# Patient Record
Sex: Female | Born: 1935 | Race: Black or African American | Hispanic: No | Smoking: Former smoker
Health system: Southern US, Community
[De-identification: ages and names within clinical notes are randomized; demographics above are authoritative.]

## PROBLEM LIST (undated history)

## (undated) DIAGNOSIS — K260 Acute duodenal ulcer with hemorrhage: Secondary | ICD-10-CM

## (undated) DIAGNOSIS — D126 Benign neoplasm of colon, unspecified: Secondary | ICD-10-CM

## (undated) DIAGNOSIS — H409 Unspecified glaucoma: Secondary | ICD-10-CM

## (undated) DIAGNOSIS — M1990 Unspecified osteoarthritis, unspecified site: Secondary | ICD-10-CM

## (undated) DIAGNOSIS — M899 Disorder of bone, unspecified: Secondary | ICD-10-CM

## (undated) DIAGNOSIS — Z23 Encounter for immunization: Secondary | ICD-10-CM

## (undated) DIAGNOSIS — K219 Gastro-esophageal reflux disease without esophagitis: Secondary | ICD-10-CM

## (undated) DIAGNOSIS — R5381 Other malaise: Secondary | ICD-10-CM

## (undated) DIAGNOSIS — M109 Gout, unspecified: Secondary | ICD-10-CM

## (undated) DIAGNOSIS — F3289 Other specified depressive episodes: Secondary | ICD-10-CM

## (undated) DIAGNOSIS — D649 Anemia, unspecified: Secondary | ICD-10-CM

## (undated) DIAGNOSIS — F329 Major depressive disorder, single episode, unspecified: Secondary | ICD-10-CM

## (undated) DIAGNOSIS — M949 Disorder of cartilage, unspecified: Secondary | ICD-10-CM

## (undated) DIAGNOSIS — R51 Headache: Secondary | ICD-10-CM

## (undated) DIAGNOSIS — R5383 Other fatigue: Secondary | ICD-10-CM

## (undated) DIAGNOSIS — I2510 Atherosclerotic heart disease of native coronary artery without angina pectoris: Secondary | ICD-10-CM

## (undated) DIAGNOSIS — I10 Essential (primary) hypertension: Secondary | ICD-10-CM

## (undated) DIAGNOSIS — E785 Hyperlipidemia, unspecified: Secondary | ICD-10-CM

## (undated) DIAGNOSIS — K5730 Diverticulosis of large intestine without perforation or abscess without bleeding: Secondary | ICD-10-CM

## (undated) DIAGNOSIS — J309 Allergic rhinitis, unspecified: Secondary | ICD-10-CM

## (undated) DIAGNOSIS — I219 Acute myocardial infarction, unspecified: Secondary | ICD-10-CM

## (undated) DIAGNOSIS — I509 Heart failure, unspecified: Secondary | ICD-10-CM

## (undated) DIAGNOSIS — J189 Pneumonia, unspecified organism: Secondary | ICD-10-CM

## (undated) HISTORY — PX: OTHER SURGICAL HISTORY: SHX169

## (undated) HISTORY — DX: Gastro-esophageal reflux disease without esophagitis: K21.9

## (undated) HISTORY — DX: Unspecified glaucoma: H40.9

## (undated) HISTORY — DX: Encounter for immunization: Z23

## (undated) HISTORY — DX: Heart failure, unspecified: I50.9

## (undated) HISTORY — DX: Diverticulosis of large intestine without perforation or abscess without bleeding: K57.30

## (undated) HISTORY — PX: ECTOPIC PREGNANCY SURGERY: SHX613

## (undated) HISTORY — DX: Other malaise: R53.81

## (undated) HISTORY — PX: VESICOVAGINAL FISTULA CLOSURE W/ TAH: SUR271

## (undated) HISTORY — DX: Disorder of cartilage, unspecified: M94.9

## (undated) HISTORY — DX: Disorder of bone, unspecified: M89.9

## (undated) HISTORY — DX: Major depressive disorder, single episode, unspecified: F32.9

## (undated) HISTORY — DX: Anemia, unspecified: D64.9

## (undated) HISTORY — DX: Allergic rhinitis, unspecified: J30.9

## (undated) HISTORY — DX: Essential (primary) hypertension: I10

## (undated) HISTORY — DX: Other specified depressive episodes: F32.89

## (undated) HISTORY — DX: Other malaise: R53.83

## (undated) HISTORY — PX: CORONARY ANGIOPLASTY WITH STENT PLACEMENT: SHX49

## (undated) HISTORY — DX: Acute myocardial infarction, unspecified: I21.9

## (undated) HISTORY — DX: Morbid (severe) obesity due to excess calories: E66.01

## (undated) HISTORY — DX: Unspecified osteoarthritis, unspecified site: M19.90

## (undated) HISTORY — DX: Atherosclerotic heart disease of native coronary artery without angina pectoris: I25.10

## (undated) HISTORY — DX: Hyperlipidemia, unspecified: E78.5

## (undated) HISTORY — DX: Gout, unspecified: M10.9

## (undated) HISTORY — DX: Benign neoplasm of colon, unspecified: D12.6

---

## 1997-03-19 ENCOUNTER — Ambulatory Visit (HOSPITAL_COMMUNITY): Admission: RE | Admit: 1997-03-19 | Discharge: 1997-03-19 | Payer: Self-pay | Admitting: *Deleted

## 1998-02-25 ENCOUNTER — Ambulatory Visit (HOSPITAL_COMMUNITY): Admission: RE | Admit: 1998-02-25 | Discharge: 1998-02-25 | Payer: Self-pay | Admitting: *Deleted

## 1998-02-26 ENCOUNTER — Encounter: Payer: Self-pay | Admitting: Orthopedic Surgery

## 1998-02-26 ENCOUNTER — Ambulatory Visit (HOSPITAL_COMMUNITY): Admission: RE | Admit: 1998-02-26 | Discharge: 1998-02-26 | Payer: Self-pay | Admitting: Orthopedic Surgery

## 1998-04-15 ENCOUNTER — Encounter: Payer: Self-pay | Admitting: *Deleted

## 1998-04-15 ENCOUNTER — Ambulatory Visit (HOSPITAL_COMMUNITY): Admission: RE | Admit: 1998-04-15 | Discharge: 1998-04-15 | Payer: Self-pay | Admitting: *Deleted

## 1998-04-16 ENCOUNTER — Encounter: Payer: Self-pay | Admitting: Orthopedic Surgery

## 1998-04-18 ENCOUNTER — Inpatient Hospital Stay (HOSPITAL_COMMUNITY): Admission: EM | Admit: 1998-04-18 | Discharge: 1998-04-22 | Payer: Self-pay | Admitting: Emergency Medicine

## 1998-04-18 ENCOUNTER — Encounter: Payer: Self-pay | Admitting: Emergency Medicine

## 1998-04-19 ENCOUNTER — Encounter: Payer: Self-pay | Admitting: Emergency Medicine

## 1998-06-01 ENCOUNTER — Encounter (HOSPITAL_COMMUNITY): Admission: RE | Admit: 1998-06-01 | Discharge: 1998-08-30 | Payer: Self-pay | Admitting: *Deleted

## 1998-09-14 ENCOUNTER — Encounter (HOSPITAL_COMMUNITY): Admission: RE | Admit: 1998-09-14 | Discharge: 1998-12-13 | Payer: Self-pay | Admitting: *Deleted

## 1998-11-16 ENCOUNTER — Ambulatory Visit (HOSPITAL_COMMUNITY): Admission: RE | Admit: 1998-11-16 | Discharge: 1998-11-17 | Payer: Self-pay | Admitting: *Deleted

## 1998-12-24 ENCOUNTER — Encounter: Payer: Self-pay | Admitting: Orthopedic Surgery

## 1998-12-27 ENCOUNTER — Encounter: Payer: Self-pay | Admitting: Orthopedic Surgery

## 1998-12-27 ENCOUNTER — Inpatient Hospital Stay (HOSPITAL_COMMUNITY): Admission: RE | Admit: 1998-12-27 | Discharge: 1998-12-30 | Payer: Self-pay | Admitting: Orthopedic Surgery

## 1998-12-30 ENCOUNTER — Inpatient Hospital Stay (HOSPITAL_COMMUNITY)
Admission: RE | Admit: 1998-12-30 | Discharge: 1999-01-08 | Payer: Self-pay | Admitting: Physical Medicine & Rehabilitation

## 1999-02-15 ENCOUNTER — Encounter: Admission: RE | Admit: 1999-02-15 | Discharge: 1999-04-13 | Payer: Self-pay | Admitting: Orthopedic Surgery

## 1999-04-19 ENCOUNTER — Ambulatory Visit (HOSPITAL_COMMUNITY): Admission: RE | Admit: 1999-04-19 | Discharge: 1999-04-19 | Payer: Self-pay | Admitting: *Deleted

## 1999-04-19 ENCOUNTER — Encounter: Payer: Self-pay | Admitting: *Deleted

## 1999-06-07 ENCOUNTER — Ambulatory Visit (HOSPITAL_COMMUNITY): Admission: RE | Admit: 1999-06-07 | Discharge: 1999-06-08 | Payer: Self-pay | Admitting: *Deleted

## 2000-03-07 ENCOUNTER — Encounter (INDEPENDENT_AMBULATORY_CARE_PROVIDER_SITE_OTHER): Payer: Self-pay

## 2000-03-07 ENCOUNTER — Other Ambulatory Visit: Admission: RE | Admit: 2000-03-07 | Discharge: 2000-03-07 | Payer: Self-pay | Admitting: Internal Medicine

## 2000-04-20 ENCOUNTER — Encounter: Payer: Self-pay | Admitting: Internal Medicine

## 2000-04-20 ENCOUNTER — Ambulatory Visit (HOSPITAL_COMMUNITY): Admission: RE | Admit: 2000-04-20 | Discharge: 2000-04-20 | Payer: Self-pay | Admitting: Internal Medicine

## 2000-08-08 ENCOUNTER — Other Ambulatory Visit: Admission: RE | Admit: 2000-08-08 | Discharge: 2000-08-08 | Payer: Self-pay | Admitting: Internal Medicine

## 2000-08-08 ENCOUNTER — Encounter (INDEPENDENT_AMBULATORY_CARE_PROVIDER_SITE_OTHER): Payer: Self-pay | Admitting: Specialist

## 2001-04-22 ENCOUNTER — Ambulatory Visit (HOSPITAL_COMMUNITY): Admission: RE | Admit: 2001-04-22 | Discharge: 2001-04-22 | Payer: Self-pay | Admitting: Internal Medicine

## 2001-04-22 ENCOUNTER — Encounter: Payer: Self-pay | Admitting: Internal Medicine

## 2002-04-30 ENCOUNTER — Encounter: Payer: Self-pay | Admitting: Internal Medicine

## 2002-04-30 ENCOUNTER — Ambulatory Visit (HOSPITAL_COMMUNITY): Admission: RE | Admit: 2002-04-30 | Discharge: 2002-04-30 | Payer: Self-pay | Admitting: Internal Medicine

## 2002-09-10 ENCOUNTER — Encounter: Admission: RE | Admit: 2002-09-10 | Discharge: 2002-09-10 | Payer: Self-pay | Admitting: Internal Medicine

## 2002-09-10 ENCOUNTER — Encounter: Payer: Self-pay | Admitting: Internal Medicine

## 2003-09-10 ENCOUNTER — Encounter: Payer: Self-pay | Admitting: Internal Medicine

## 2003-12-25 ENCOUNTER — Ambulatory Visit: Payer: Self-pay | Admitting: Cardiology

## 2005-01-30 ENCOUNTER — Ambulatory Visit: Payer: Self-pay | Admitting: Cardiology

## 2005-02-19 ENCOUNTER — Emergency Department (HOSPITAL_COMMUNITY): Admission: EM | Admit: 2005-02-19 | Discharge: 2005-02-19 | Payer: Self-pay | Admitting: Emergency Medicine

## 2005-02-23 ENCOUNTER — Ambulatory Visit: Payer: Self-pay | Admitting: Internal Medicine

## 2005-03-01 ENCOUNTER — Ambulatory Visit: Payer: Self-pay | Admitting: Family Medicine

## 2005-03-22 ENCOUNTER — Ambulatory Visit: Payer: Self-pay | Admitting: Cardiology

## 2005-05-05 ENCOUNTER — Ambulatory Visit: Payer: Self-pay | Admitting: Internal Medicine

## 2005-06-29 ENCOUNTER — Ambulatory Visit: Payer: Self-pay | Admitting: Internal Medicine

## 2005-08-11 ENCOUNTER — Ambulatory Visit: Payer: Self-pay | Admitting: Internal Medicine

## 2006-05-30 ENCOUNTER — Ambulatory Visit: Payer: Self-pay | Admitting: Cardiology

## 2006-05-30 LAB — CONVERTED CEMR LAB
ALT: 10 units/L (ref 0–40)
AST: 14 units/L (ref 0–37)
Albumin: 3.2 g/dL — ABNORMAL LOW (ref 3.5–5.2)
Alkaline Phosphatase: 66 units/L (ref 39–117)
Bilirubin, Direct: 0.1 mg/dL (ref 0.0–0.3)
Cholesterol: 123 mg/dL (ref 0–200)
HDL: 37.6 mg/dL — ABNORMAL LOW (ref >39.0)
LDL Cholesterol: 58 mg/dL (ref 0–99)
Total Bilirubin: 0.5 mg/dL (ref 0.3–1.2)
Total CHOL/HDL Ratio: 3.3
Total Protein: 6.6 g/dL (ref 6.0–8.3)
Triglycerides: 136 mg/dL (ref 0–149)
VLDL: 27 mg/dL (ref 0–40)

## 2006-08-22 ENCOUNTER — Ambulatory Visit: Payer: Self-pay | Admitting: Internal Medicine

## 2006-10-04 ENCOUNTER — Encounter: Payer: Self-pay | Admitting: *Deleted

## 2006-10-04 DIAGNOSIS — I10 Essential (primary) hypertension: Secondary | ICD-10-CM

## 2006-10-04 DIAGNOSIS — E785 Hyperlipidemia, unspecified: Secondary | ICD-10-CM

## 2006-10-04 DIAGNOSIS — M109 Gout, unspecified: Secondary | ICD-10-CM

## 2006-10-04 DIAGNOSIS — I2510 Atherosclerotic heart disease of native coronary artery without angina pectoris: Secondary | ICD-10-CM | POA: Insufficient documentation

## 2006-10-09 ENCOUNTER — Ambulatory Visit: Payer: Self-pay | Admitting: Cardiology

## 2007-03-28 ENCOUNTER — Encounter: Payer: Self-pay | Admitting: Internal Medicine

## 2007-03-28 ENCOUNTER — Ambulatory Visit: Payer: Self-pay | Admitting: Internal Medicine

## 2007-06-15 DIAGNOSIS — K5730 Diverticulosis of large intestine without perforation or abscess without bleeding: Secondary | ICD-10-CM | POA: Insufficient documentation

## 2007-06-15 DIAGNOSIS — I252 Old myocardial infarction: Secondary | ICD-10-CM

## 2007-06-15 DIAGNOSIS — D126 Benign neoplasm of colon, unspecified: Secondary | ICD-10-CM

## 2007-07-03 ENCOUNTER — Ambulatory Visit: Payer: Self-pay | Admitting: Internal Medicine

## 2007-07-03 DIAGNOSIS — R5381 Other malaise: Secondary | ICD-10-CM | POA: Insufficient documentation

## 2007-07-03 DIAGNOSIS — M949 Disorder of cartilage, unspecified: Secondary | ICD-10-CM

## 2007-07-03 DIAGNOSIS — I5022 Chronic systolic (congestive) heart failure: Secondary | ICD-10-CM

## 2007-07-03 DIAGNOSIS — J309 Allergic rhinitis, unspecified: Secondary | ICD-10-CM | POA: Insufficient documentation

## 2007-07-03 DIAGNOSIS — F329 Major depressive disorder, single episode, unspecified: Secondary | ICD-10-CM

## 2007-07-03 DIAGNOSIS — R5383 Other fatigue: Secondary | ICD-10-CM

## 2007-07-03 DIAGNOSIS — K219 Gastro-esophageal reflux disease without esophagitis: Secondary | ICD-10-CM | POA: Insufficient documentation

## 2007-07-03 DIAGNOSIS — M899 Disorder of bone, unspecified: Secondary | ICD-10-CM | POA: Insufficient documentation

## 2007-07-03 LAB — CONVERTED CEMR LAB
ALT: 12 units/L (ref 0–35)
AST: 15 units/L (ref 0–37)
Albumin: 3.7 g/dL (ref 3.5–5.2)
Alkaline Phosphatase: 72 units/L (ref 39–117)
BUN: 24 mg/dL — ABNORMAL HIGH (ref 6–23)
Basophils Absolute: 0 10*3/uL (ref 0.0–0.1)
Basophils Relative: 0 % (ref 0.0–1.0)
Bilirubin, Direct: 0.1 mg/dL (ref 0.0–0.3)
CO2: 25 meq/L (ref 19–32)
Calcium: 8.8 mg/dL (ref 8.4–10.5)
Chloride: 115 meq/L — ABNORMAL HIGH (ref 96–112)
Cholesterol: 149 mg/dL (ref 0–200)
Creatinine, Ser: 1.2 mg/dL (ref 0.4–1.2)
Eosinophils Absolute: 0.2 10*3/uL (ref 0.0–0.7)
Eosinophils Relative: 1.7 % (ref 0.0–5.0)
GFR calc Af Amer: 55 mL/min
GFR calc non Af Amer: 45 mL/min
Glucose, Bld: 112 mg/dL — ABNORMAL HIGH (ref 70–99)
HCT: 35.9 % — ABNORMAL LOW (ref 36.0–46.0)
HDL: 37.3 mg/dL — ABNORMAL LOW (ref >39.0)
Hemoglobin: 11.4 g/dL — ABNORMAL LOW (ref 12.0–15.0)
LDL Cholesterol: 83 mg/dL (ref 0–99)
Lymphocytes Relative: 18.2 % (ref 12.0–46.0)
MCHC: 31.9 g/dL (ref 30.0–36.0)
MCV: 94.3 fL (ref 78.0–100.0)
Monocytes Absolute: 0.8 10*3/uL (ref 0.1–1.0)
Monocytes Relative: 6.9 % (ref 3.0–12.0)
Neutro Abs: 8.2 10*3/uL — ABNORMAL HIGH (ref 1.4–7.7)
Neutrophils Relative %: 73.2 % (ref 43.0–77.0)
Platelets: 308 10*3/uL (ref 150–400)
Potassium: 4.8 meq/L (ref 3.5–5.1)
RBC: 3.81 M/uL — ABNORMAL LOW (ref 3.87–5.11)
RDW: 15.1 % — ABNORMAL HIGH (ref 11.5–14.6)
Sodium: 143 meq/L (ref 135–145)
TSH: 1.26 microintl units/mL (ref 0.35–5.50)
Total Bilirubin: 0.4 mg/dL (ref 0.3–1.2)
Total CHOL/HDL Ratio: 4
Total Protein: 6.9 g/dL (ref 6.0–8.3)
Triglycerides: 143 mg/dL (ref 0–149)
VLDL: 29 mg/dL (ref 0–40)
WBC: 11.2 10*3/uL — ABNORMAL HIGH (ref 4.5–10.5)

## 2007-07-18 ENCOUNTER — Ambulatory Visit: Payer: Self-pay | Admitting: Cardiology

## 2008-01-02 ENCOUNTER — Ambulatory Visit: Payer: Self-pay | Admitting: Internal Medicine

## 2008-01-22 ENCOUNTER — Ambulatory Visit: Payer: Self-pay | Admitting: Cardiology

## 2008-01-23 ENCOUNTER — Ambulatory Visit: Payer: Self-pay | Admitting: Internal Medicine

## 2008-08-17 DIAGNOSIS — M1990 Unspecified osteoarthritis, unspecified site: Secondary | ICD-10-CM

## 2008-08-17 DIAGNOSIS — H409 Unspecified glaucoma: Secondary | ICD-10-CM | POA: Insufficient documentation

## 2008-08-25 ENCOUNTER — Ambulatory Visit: Payer: Self-pay | Admitting: Cardiology

## 2008-10-26 ENCOUNTER — Encounter: Payer: Self-pay | Admitting: Internal Medicine

## 2008-12-18 ENCOUNTER — Encounter (INDEPENDENT_AMBULATORY_CARE_PROVIDER_SITE_OTHER): Payer: Self-pay | Admitting: *Deleted

## 2009-02-18 ENCOUNTER — Ambulatory Visit: Payer: Self-pay | Admitting: Cardiology

## 2009-02-19 ENCOUNTER — Telehealth: Payer: Self-pay | Admitting: Cardiology

## 2009-05-19 ENCOUNTER — Ambulatory Visit: Payer: Self-pay | Admitting: Internal Medicine

## 2009-05-19 ENCOUNTER — Encounter: Payer: Self-pay | Admitting: Internal Medicine

## 2009-09-21 ENCOUNTER — Ambulatory Visit: Payer: Self-pay | Admitting: Cardiology

## 2009-11-02 ENCOUNTER — Ambulatory Visit: Payer: Self-pay | Admitting: Cardiology

## 2009-11-03 LAB — CONVERTED CEMR LAB
ALT: 9 units/L (ref 0–35)
AST: 14 units/L (ref 0–37)
Albumin: 4 g/dL (ref 3.5–5.2)
Alkaline Phosphatase: 62 units/L (ref 39–117)
Bilirubin, Direct: 0.1 mg/dL (ref 0.0–0.3)
Cholesterol: 173 mg/dL (ref 0–200)
HDL: 41.6 mg/dL (ref >39.00)
LDL Cholesterol: 103 mg/dL — ABNORMAL HIGH (ref 0–99)
Total Bilirubin: 0.6 mg/dL (ref 0.3–1.2)
Total CHOL/HDL Ratio: 4
Total Protein: 6.6 g/dL (ref 6.0–8.3)
Triglycerides: 142 mg/dL (ref 0.0–149.0)
VLDL: 28.4 mg/dL (ref 0.0–40.0)

## 2010-03-13 LAB — CONVERTED CEMR LAB
ALT: 9 units/L (ref 0–35)
AST: 15 units/L (ref 0–37)
Albumin: 3.5 g/dL (ref 3.5–5.2)
Alkaline Phosphatase: 74 units/L (ref 39–117)
BUN: 28 mg/dL — ABNORMAL HIGH (ref 6–23)
Bilirubin, Direct: 0 mg/dL (ref 0.0–0.3)
CO2: 25 meq/L (ref 19–32)
Calcium: 8.9 mg/dL (ref 8.4–10.5)
Chloride: 112 meq/L (ref 96–112)
Cholesterol: 155 mg/dL (ref 0–200)
Creatinine, Ser: 1.4 mg/dL — ABNORMAL HIGH (ref 0.4–1.2)
GFR calc non Af Amer: 45.87 mL/min (ref >60)
Glucose, Bld: 115 mg/dL — ABNORMAL HIGH (ref 70–99)
HDL: 41.9 mg/dL (ref >39.00)
LDL Cholesterol: 75 mg/dL (ref 0–99)
Potassium: 4.7 meq/L (ref 3.5–5.1)
Sodium: 143 meq/L (ref 135–145)
Total Bilirubin: 0.6 mg/dL (ref 0.3–1.2)
Total CHOL/HDL Ratio: 4
Total Protein: 6.8 g/dL (ref 6.0–8.3)
Triglycerides: 191 mg/dL — ABNORMAL HIGH (ref 0.0–149.0)
VLDL: 38.2 mg/dL (ref 0.0–40.0)

## 2010-03-15 NOTE — Progress Notes (Signed)
Summary: lab results   Phone Note Call from Patient Call back at Home Phone [phone]   Caller: Patient Reason for Call: Lab or Test Results Initial call taken by: Judie Grieve,  February 19, 2009 2:03 PM  Follow-up for Phone Call        PT AWARE OF LAB  RESULTS. Follow-up by: Scherrie Bateman, LPN,  February 19, 2009 3:07 PM

## 2010-03-15 NOTE — Miscellaneous (Signed)
Summary: Advance Directive for a Natural Death  Advance Directive for a Natural Death   Imported By: Lester Mulvane 05/24/2009 09:32:58  _____________________________________________________________________  External Attachment:    Type:   Image     Comment:   External Document

## 2010-03-15 NOTE — Assessment & Plan Note (Signed)
Summary: 6 mo f/u ./cy  Medications Added ZOCOR 40 MG TABS (SIMVASTATIN) Take 1 tablet daily ZANTAC 150 MG  TABS (RANITIDINE HCL) 1 tab as needed        Visit Type:  6 mo f/u Primary Provider:  Corwin Levins MD  CC:  sob w/walking....edema/feet in the heat in the evening.  History of Present Illness: Mrs. Needs returns today for evaluation and management of her coronary artery disease, hyperlipidemia, hypertension, and history of congestive heart failure.  She's having no angina or ischemic symptoms. She denies orthopnea, PND or edema. Her biggest problem is her arthritic pain which really affects her mobility.  She is very compliant with her medications. Meds reviewed with her today we note she is on 80 mg of Zocor.  Clinical Reports Reviewed:  Cardiac Cath:  06/07/1999: Cardiac Cath Findings:  COMPLICATIONS:  None.  RESULTS:  Successful PTCA for in-stent re-stenosis with placement of a new stent distal to the previous stent.  As described above, a proximal 75% stenosis was reduced to 25%.  A mid 60% was reduced to 25% and a further down just distal to the stent a 40% stenosis was reduced to 0% residual with TIMI-3 flow.  PLAN:  Integrilin will be continued for 18 hours.  Plavix will be administered for four weeks.  If the patient has recurrent in-stent re-stenosis would consider the use of brachytherapy at that time.  Because it was necessary to place a new stent during this procedure, we opted not to use brachytherapy at this time. DD:  06/07/99 TD:  06/07/99 Job: [id number]   Current Medications (verified): 1)  Toprol Xl 50 Mg Tb24 (Metoprolol Succinate) .... Take 1 Tablet By Mouth Once A Day 2)  Triamterene-Hctz 37.5-25 Mg  Tabs (Triamterene-Hctz) .... Take 1 Tablet By Mouth Once A Day 3)  Altace 10 Mg  Tabs (Ramipril) .... Take 1 Tablet By Mouth Two Times A Day 4)  Ecotrin Low Strength 81 Mg  Tbec (Aspirin) .... Take 1 Tablet By Mouth Once A Day 5)   Zocor 80 Mg  Tabs (Simvastatin) .... Take 1 Tablet By Mouth Once A Day 6)  Zantac 150 Mg  Tabs (Ranitidine Hcl) .Marland Kitchen.. 1 Tab As Needed 7)  Aleve 220 Mg Tabs (Naproxen Sodium) .... As Needed  Allergies: 1)  ! Sulfa  Past History:  Past Medical History: Last updated: 08/17/2008 AMI (ICD-410.90) CONGESTIVE HEART FAILURE (ICD-428.0) CORONARY ARTERY DISEASE (ICD-414.00) HYPERTENSION (ICD-401.9) HYPERLIPIDEMIA (ICD-272.4) GERD (ICD-530.81) DEPRESSION (ICD-311) OBESITY, MORBID (ICD-278.01) NEED PROPHYLACTIC VACCINATION&INOCULATION FLU (ICD-V04.81) ALLERGIC RHINITIS (ICD-477.9) FATIGUE (ICD-780.79) OSTEOPENIA (ICD-733.90) DIVERTICULOSIS, COLON (ICD-562.10) ADENOMATOUS COLONIC POLYP (ICD-211.3) COLONIC POLYPS (ICD-211.3) GOUT (ICD-274.9) GLAUCOMA (ICD-365.9) DEGENERATIVE JOINT DISEASE (ICD-715.90)    Past Surgical History: Last updated: 07/03/2007 angioplasty/RCA stent hysterectomy s/p left hip replacement 2002 secondary to DJD hx of ectopic pregnancy  Family History: Last updated: 08/17/2008 sister with breast cancer mother with stomach problems father with HTN, stroke  Social History: Last updated: 07/03/2007 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  no acute distress, very pleasant Head:  normocephalic and atraumatic Eyes:  PERRLA/EOM intact; conjunctiva and lids normal. Neck:  Neck supple, no JVD. No masses, thyromegaly or abnormal cervical nodes. Chest Denym Christenberry:  no deformities or  breast masses noted Lungs:  Clear bilaterally to auscultation and percussion. Heart:  PMI poorly appreciated, soft S1-S2, no gallop murmur or rub. Carotid Dopplers equal without bruits Msk:  decreased ROM.  using a cane Pulses:  pulses normal in all 4 extremities Extremities:  No clubbing or cyanosis. Neurologic:  Alert and oriented x 3. Skin:  Intact without lesions or rashes. Psych:  Normal affect.   Impression & Recommendations:  Problem # 1:  CORONARY ARTERY DISEASE (ICD-414.00) Assessment Unchanged  Her updated medication list for this problem includes:    Toprol Xl 50 Mg Tb24 (Metoprolol succinate) .Marland Kitchen... Take 1 tablet by mouth once a day    Altace 10 Mg Tabs (Ramipril) .Marland Kitchen... Take 1 tablet by mouth two times a day    Ecotrin Low Strength 81 Mg Tbec (Aspirin) .Marland Kitchen... Take 1 tablet by mouth once a day  Problem # 2:  CONGESTIVE HEART FAILURE (ICD-428.0) Assessment: Unchanged  Her updated medication list for this problem includes:    Toprol Xl 50 Mg Tb24 (Metoprolol succinate) .Marland Kitchen... Take 1 tablet by mouth once a day    Triamterene-hctz 37.5-25 Mg Tabs (Triamterene-hctz) .Marland Kitchen... Take 1 tablet by mouth once a day    Altace 10 Mg Tabs (Ramipril) .Marland Kitchen... Take 1 tablet by mouth two times a day    Ecotrin Low Strength 81 Mg Tbec (Aspirin) .Marland Kitchen... Take 1 tablet by mouth once a day  Problem # 3:  HYPERTENSION (ICD-401.9) Assessment: Improved  Her updated medication list for this problem includes:    Toprol Xl 50 Mg Tb24 (Metoprolol succinate) .Marland Kitchen... Take 1 tablet by mouth once a day    Triamterene-hctz 37.5-25 Mg Tabs (Triamterene-hctz) .Marland Kitchen... Take 1 tablet by mouth once a day    Altace 10 Mg Tabs (Ramipril) .Marland Kitchen... Take 1 tablet by mouth two times a day    Ecotrin Low Strength 81 Mg Tbec (Aspirin) .Marland Kitchen... Take 1 tablet by mouth once a day  Problem # 4:  HYPERLIPIDEMIA (ICD-272.4) Will decrease her Zocor from 80-40. Followup blood work will be obtained in about 6 weeks. She should not lift more  than about 3% of LDL lowering.  Patient Instructions: 1)  Your physician recommends that you schedule a follow-up appointment in:  1 year with Dr. Daleen Squibb 2)  Your physician recommends that you return for a FASTING lipid profile, liver  in 6 weeks 3)  Your physician has recommended you make the following change in your medication:  Prescriptions: ZOCOR 40 MG TABS (SIMVASTATIN) Take 1 tablet daily  #30 x 11   Entered by:   Lisabeth Devoid RN   Authorized by:   Gaylord Shih, MD, [facility]   Signed by:   Lisabeth Devoid RN on 09/21/2009   Method used:   Electronically to        [facility]  [address] [phone]* (retail)       [address]       [place], [place]  [id number]       Ph: [phone] or [phone]       Fax:  [phone]   RxID:   [id number]   Appended Document: 6 mo f/u ./cy ok.  Reviewed Juanito Doom, MD

## 2010-03-15 NOTE — Assessment & Plan Note (Signed)
Summary: 6 month rov/sl  Medications Added ALEVE 220 MG TABS (NAPROXEN SODIUM) as needed        Visit Type:  6 mo f/u Primary Provider:  Corwin Levins MD  CC:  sob..pt denies any other complaints today.  History of Present Illness: Beth Gutierrez returns today for measure coronary disease, hypertension and mixed hyperlipidemia.  She has not seen her primary care physician sometime. She is overdue labs. We will call today that she's not fasting.  She's not having any angina. Again, she is limited by her arthritic condition and is very slow to get around. She did have a nice Christmas with her son was visiting her. Her she denies orthopnea, PND, or edema.  Current Medications (verified): 1)  Allopurinol 100 Mg Tabs (Allopurinol) .... Take 1 Tablet By Mouth Once A Day 2)  Boniva 150 Mg Tabs (Ibandronate Sodium) .... Take 1 Tablet By Mouth--No Addtnl Refills Pt Overdue For An Appt- This Will Be The Last Fill 3)  Toprol Xl 50 Mg Tb24 (Metoprolol Succinate) .... Take 1 Tablet By Mouth Once A Day 4)  Triamterene-Hctz 37.5-25 Mg  Tabs (Triamterene-Hctz) .... Take 1 Tablet By Mouth Once A Day 5)  Altace 10 Mg  Tabs (Ramipril) .... Take 1 Tablet By Mouth Two Times A Day 6)  Ecotrin Low Strength 81 Mg  Tbec (Aspirin) .... Take 1 Tablet By Mouth Once A Day 7)  Zocor 80 Mg  Tabs (Simvastatin) .... Take 1 Tablet By Mouth Once A Day 8)  Azopt 1 % Susp (Brinzolamide) .... Use As Directed 9)  Alphagan P 0.1 %  Soln (Brimonidine Tartrate) .... As Directed 10)  Zantac 150 Mg  Tabs (Ranitidine Hcl) .... As Directed 11)  Aleve 220 Mg Tabs (Naproxen Sodium) .... As Needed  Allergies: 1)  ! Sulfa  Past History:  Past Medical History: Last updated: 08/17/2008 AMI (ICD-410.90) CONGESTIVE HEART FAILURE (ICD-428.0) CORONARY ARTERY DISEASE (ICD-414.00) HYPERTENSION (ICD-401.9) HYPERLIPIDEMIA (ICD-272.4) GERD (ICD-530.81) DEPRESSION (ICD-311) OBESITY, MORBID (ICD-278.01) NEED PROPHYLACTIC  VACCINATION&INOCULATION FLU (ICD-V04.81) ALLERGIC RHINITIS (ICD-477.9) FATIGUE (ICD-780.79) OSTEOPENIA (ICD-733.90) DIVERTICULOSIS, COLON (ICD-562.10) ADENOMATOUS COLONIC POLYP (ICD-211.3) COLONIC POLYPS (ICD-211.3) GOUT (ICD-274.9) GLAUCOMA (ICD-365.9) DEGENERATIVE JOINT DISEASE (ICD-715.90)    Past Surgical History: Last updated: 07/03/2007 angioplasty/RCA stent hysterectomy s/p left hip replacement 2002 secondary to DJD hx of ectopic pregnancy  Family History: Last updated: 08/17/2008 sister with breast cancer mother with stomach problems father with HTN, stroke  Social History: Last updated: 07/03/2007 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   Head:  normocephalic and atraumatic Eyes:  PERRLA/EOM intact; conjunctiva and lids normal. Lungs:  Clear bilaterally to auscultation and percussion. Heart:  PMI not well appreciated, regular rate and rhythm, normal S1-S2 Msk:  decreased ROM.   Pulses:  pulses normal in all 4 extremities Extremities:  trace left pedal edema and trace right pedal edema.   Neurologic:  Alert and  oriented x 3. Skin:  Intact without lesions or rashes. Psych:  Normal affect.   Impression & Recommendations:  Problem # 1:  CORONARY ARTERY DISEASE (ICD-414.00) Assessment Unchanged  Her updated medication list for this problem includes:    Toprol Xl 50 Mg Tb24 (Metoprolol succinate) .Marland Kitchen... Take 1 tablet by mouth once a day    Altace 10 Mg Tabs (Ramipril) .Marland Kitchen... Take 1 tablet  by mouth two times a day    Ecotrin Low Strength 81 Mg Tbec (Aspirin) .Marland Kitchen... Take 1 tablet by mouth once a day  Orders: EKG w/ Interpretation (93000)  Problem # 2:  HYPERTENSION (ICD-401.9) Assessment: Improved  Her updated medication list for this problem includes:    Toprol Xl 50 Mg Tb24 (Metoprolol succinate) .Marland Kitchen... Take 1 tablet by mouth once a day    Triamterene-hctz 37.5-25 Mg Tabs (Triamterene-hctz) .Marland Kitchen... Take 1 tablet by mouth once a day    Altace 10 Mg Tabs (Ramipril) .Marland Kitchen... Take 1 tablet by mouth two times a day    Ecotrin Low Strength 81 Mg Tbec (Aspirin) .Marland Kitchen... Take 1 tablet by mouth once a day  Problem # 3:  HYPERLIPIDEMIA (ICD-272.4)  She Has not had blood work in over a year and a half. We will pro-actively go ahead and check blood work states she's not been back to primary care in some time. Her updated medication list for this problem includes:    Zocor 80 Mg Tabs (Simvastatin) .Marland Kitchen... Take 1 tablet by mouth once a day  Orders: TLB-Lipid Panel (80061-LIPID)  Other Orders: TLB-BMP (Basic Metabolic Panel-BMET) (80048-METABOL) TLB-Hepatic/Liver Function Pnl (80076-HEPATIC)  Patient Instructions: 1)  Your physician recommends that you schedule a follow-up appointment in: 6 months  due july 2011 2)  Your physician recommends that you return for lab work in: today bmet lipid liver  3)  Your physician recommends that you continue on your current medications as directed. Please refer to the Current Medication list given to you today.

## 2010-03-15 NOTE — Miscellaneous (Signed)
Summary: BONE DENSITY  Clinical Lists Changes  Orders: Added new Test order of T-Bone Densitometry (77080) - Signed Added new Test order of T-Lumbar Vertebral Assessment (77082) - Signed 

## 2010-04-21 ENCOUNTER — Other Ambulatory Visit: Payer: Medicare Other

## 2010-04-21 ENCOUNTER — Ambulatory Visit (INDEPENDENT_AMBULATORY_CARE_PROVIDER_SITE_OTHER): Payer: Medicare Other | Admitting: Internal Medicine

## 2010-04-21 ENCOUNTER — Other Ambulatory Visit: Payer: Self-pay | Admitting: Internal Medicine

## 2010-04-21 ENCOUNTER — Encounter: Payer: Self-pay | Admitting: Internal Medicine

## 2010-04-21 DIAGNOSIS — D649 Anemia, unspecified: Secondary | ICD-10-CM

## 2010-04-21 DIAGNOSIS — Z0000 Encounter for general adult medical examination without abnormal findings: Secondary | ICD-10-CM

## 2010-04-21 DIAGNOSIS — Z79899 Other long term (current) drug therapy: Secondary | ICD-10-CM

## 2010-04-21 DIAGNOSIS — M79609 Pain in unspecified limb: Secondary | ICD-10-CM | POA: Insufficient documentation

## 2010-04-21 DIAGNOSIS — M25579 Pain in unspecified ankle and joints of unspecified foot: Secondary | ICD-10-CM

## 2010-04-21 LAB — BASIC METABOLIC PANEL
Calcium: 8.7 mg/dL (ref 8.4–10.5)
Creatinine, Ser: 1.5 mg/dL — ABNORMAL HIGH (ref 0.4–1.2)
GFR: 41.92 mL/min — ABNORMAL LOW (ref >60.00)

## 2010-04-21 LAB — LIPID PANEL
Cholesterol: 152 mg/dL (ref 0–200)
LDL Cholesterol: 77 mg/dL (ref 0–99)
Triglycerides: 189 mg/dL — ABNORMAL HIGH (ref 0.0–149.0)
VLDL: 37.8 mg/dL (ref 0.0–40.0)

## 2010-04-21 LAB — CBC WITH DIFFERENTIAL/PLATELET
Basophils Absolute: 0.1 10*3/uL (ref 0.0–0.1)
HCT: 32.7 % — ABNORMAL LOW (ref 36.0–46.0)
Lymphs Abs: 1.9 10*3/uL (ref 0.7–4.0)
MCHC: 33 g/dL (ref 30.0–36.0)
MCV: 96.1 fl (ref 78.0–100.0)
Monocytes Absolute: 0.8 10*3/uL (ref 0.1–1.0)
Monocytes Relative: 7.4 % (ref 3.0–12.0)
Neutro Abs: 8.1 10*3/uL — ABNORMAL HIGH (ref 1.4–7.7)
Platelets: 274 10*3/uL (ref 150.0–400.0)
RBC: 3.4 Mil/uL — ABNORMAL LOW (ref 3.87–5.11)
RDW: 15.6 % — ABNORMAL HIGH (ref 11.5–14.6)
WBC: 11.1 10*3/uL — ABNORMAL HIGH (ref 4.5–10.5)

## 2010-04-21 LAB — HEPATIC FUNCTION PANEL
AST: 13 U/L (ref 0–37)
Albumin: 3.7 g/dL (ref 3.5–5.2)
Total Bilirubin: 0.4 mg/dL (ref 0.3–1.2)

## 2010-04-22 LAB — URINALYSIS
Bilirubin Urine: NEGATIVE
Hgb urine dipstick: NEGATIVE
Nitrite: NEGATIVE
Total Protein, Urine: NEGATIVE
Urine Glucose: NEGATIVE
pH: 5.5 (ref 5.0–8.0)

## 2010-04-22 LAB — IBC PANEL
Iron: 48 ug/dL (ref 42–145)
Saturation Ratios: 12.7 % — ABNORMAL LOW (ref 20.0–50.0)

## 2010-04-26 NOTE — Assessment & Plan Note (Signed)
Summary: FOOT PROBLEM /NWS   Vital Signs:  Patient profile:   77 year old female Height:      62 inches (157.48 cm) Weight:      242.25 pounds (110.11 kg) BMI:     44.47 O2 Sat:      98 % on Room air Temp:     98.0 degrees F (36.67 degrees C) oral Pulse rate:   87 / minute Resp:     16 per minute BP sitting:   160 / 64  (left arm) Cuff size:   large  Vitals Entered By: Burnard Leigh CMA(AAMA) (April 21, 2010 2:38 PM)  O2 Flow:  Room air  Preventive Care Screening  Last Flu Shot:    Date:  12/14/2009    Results:  given   CC: Pt c/o soreness & tenderness in Left foot in ball of foot x2 weeks/sls,cma/wellness Is Patient Diabetic? No Comments Pt states she has been treated for Gout in past, but could not get last Rx authorized for refill.   Primary Care Provider:  Corwin Levins MD  CC:  Pt c/o soreness & tenderness in Left foot in ball of foot x2 weeks/sls and cma/wellness.  History of Present Illness: here for wellness and left foot pain , at the plantar aspect of the first MTP without swelling, hurst mod to severe to walk for over a wk; no recent trauma.  Has hx of gout and wondering if this could be a recurrence, but no sweling.  No redness, fever or red streaks as well.  Pt denies CP, worsening sob, doe, wheezing, orthopnea, pnd, worsening LE edema, palps, dizziness or syncope  Pt denies new neuro symptoms such as headache, facial or extremity weakness  Pt denies polydipsia, polyuria  Overall good compliance with meds, trying to follow low chol diet, wt stable, little excercise however  . Denies worsening depressive symptoms, suicidal ideation, or panic.   No fever, wt loss, night sweats, loss of appetite or other constitutional symptoms  Overall good compliance with meds, and good tolerability.  Pt states good ability with ADL's, low fall risk, home safety reviewed and adequate, no significant change in hearing or vision, trying to follow lower chol diet, and occasionally active  only with regular excercise.    Preventive Screening-Counseling & Management      Drug Use:  no.    Problems Prior to Update: 1)  Foot Pain, Left  (ICD-729.5) 2)  Preventive Health Care  (ICD-V70.0) 3)  Encounter For Long-term Use of Other Medications  (ICD-V58.69) 4)  AMI  (ICD-410.90) 5)  Congestive Heart Failure  (ICD-428.0) 6)  Coronary Artery Disease  (ICD-414.00) 7)  Hypertension  (ICD-401.9) 8)  Hyperlipidemia  (ICD-272.4) 9)  Gerd  (ICD-530.81) 10)  Depression  (ICD-311) 11)  Obesity, Morbid  (ICD-278.01) 12)  Need Prophylactic Vaccination&inoculation Flu  (ICD-V04.81) 13)  Allergic Rhinitis  (ICD-477.9) 14)  Fatigue  (ICD-780.79) 15)  Osteopenia  (ICD-733.90) 16)  Diverticulosis, Colon  (ICD-562.10) 17)  Adenomatous Colonic Polyp  (ICD-211.3) 18)  Colonic Polyps  (ICD-211.3) 19)  Gout  (ICD-274.9) 20)  Glaucoma  (ICD-365.9) 21)  Degenerative Joint Disease  (ICD-715.90)  Medications Prior to Update: 1)  Toprol Xl 50 Mg Tb24 (Metoprolol Succinate) .... Take 1 Tablet By Mouth Once A Day 2)  Triamterene-Hctz 37.5-25 Mg  Tabs (Triamterene-Hctz) .... Take 1 Tablet By Mouth Once A Day 3)  Ramipril 10 Mg Caps (Ramipril) .Marland Kitchen.. 1 Cap Two Times A Day 4)  Ecotrin Low  Strength 81 Mg  Tbec (Aspirin) .... Take 1 Tablet By Mouth Once A Day 5)  Zocor 40 Mg Tabs (Simvastatin) .... Take 1 Tablet Daily 6)  Zantac 150 Mg  Tabs (Ranitidine Hcl) .Marland Kitchen.. 1 Tab As Needed 7)  Aleve 220 Mg Tabs (Naproxen Sodium) .... As Needed  Current Medications (verified): 1)  Toprol Xl 50 Mg Tb24 (Metoprolol Succinate) .... Take 1 Tablet By Mouth Once A Day 2)  Triamterene-Hctz 37.5-25 Mg  Tabs (Triamterene-Hctz) .... Take 1 Tablet By Mouth Once A Day 3)  Ramipril 10 Mg Caps (Ramipril) .Marland Kitchen.. 1 Cap Two Times A Day 4)  Ecotrin Low Strength 81 Mg  Tbec (Aspirin) .... Take 1 Tablet By Mouth Once A Day 5)  Zocor 40 Mg Tabs (Simvastatin) .... Take 1 Tablet Daily 6)  Zantac 150 Mg  Tabs (Ranitidine Hcl) .Marland Kitchen.. 1 Tab  As Needed 7)  Aleve 220 Mg Tabs (Naproxen Sodium) .... As Needed 8)  Voltaren 1 % Gel (Diclofenac Sodium) .... Use Asd Two Times A Day As Needed  Allergies (verified): 1)  ! Sulfa  Past History:  Past Medical History: Last updated: 08/17/2008 AMI (ICD-410.90) CONGESTIVE HEART FAILURE (ICD-428.0) CORONARY ARTERY DISEASE (ICD-414.00) HYPERTENSION (ICD-401.9) HYPERLIPIDEMIA (ICD-272.4) GERD (ICD-530.81) DEPRESSION (ICD-311) OBESITY, MORBID (ICD-278.01) NEED PROPHYLACTIC VACCINATION&INOCULATION FLU (ICD-V04.81) ALLERGIC RHINITIS (ICD-477.9) FATIGUE (ICD-780.79) OSTEOPENIA (ICD-733.90) DIVERTICULOSIS, COLON (ICD-562.10) ADENOMATOUS COLONIC POLYP (ICD-211.3) COLONIC POLYPS (ICD-211.3) GOUT (ICD-274.9) GLAUCOMA (ICD-365.9) DEGENERATIVE JOINT DISEASE (ICD-715.90)    Past Surgical History: Last updated: 07/03/2007 angioplasty/RCA stent hysterectomy s/p left hip replacement 2002 secondary to DJD hx of ectopic pregnancy  Family History: Last updated: 08/17/2008 sister with breast cancer mother with stomach problems father with HTN, stroke  Social History: Last updated: 04/21/2010 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 Alcohol use-no Drug use-no  Risk Factors: Smoking Status: quit (07/03/2007)  Social History: 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         all otherwise negative per pt -    Physical Exam  General:  obese.  no acute distress,  very pleasant Head:  normocephalic and atraumatic Eyes:  PERRLA/EOM intact; conjunctiva and lids normal. Ears:  External ear exam shows no significant lesions or deformities.  Otoscopic examination reveals clear canals, tympanic membranes are intact bilaterally without bulging, retraction, inflammation or discharge. Hearing is grossly normal bilaterally. Nose:  no deformity, discharge, inflammation, or lesions Mouth:  Teeth, gums and palate normal. Oral mucosa normal. Neck:  Neck supple, no JVD. No masses, thyromegaly or abnormal cervical nodes. Lungs:  Clear bilaterally to auscultation and percussion. Heart:  normal rate and regular rhythm.   Abdomen:  Bowel sounds positive; abdomen soft and non-tender without masses, organomegaly, or hernias noted. No hepatosplenomegaly. Msk:  decreased ROM.  using a cane, has moderate tender with sweling, ulcer to plantar aspect first MTP only;  no lateral bursa tender or dorsal aspect first MTP swelling or tender Extremities:  No clubbing or cyanosis. Neurologic:  Alert and oriented x 3.strength normal in all extremities and sensation intact to light touch.   Skin:  color normal and no rashes.   Psych:  not depressed appearing and slightly anxious.     Impression & Recommendations:  Problem # 1:  Preventive Health Care (ICD-V70.0)  Overall doing well, age appropriate education and counseling updated, referral for preventive services and immunizations addressed, dietary counseling and smoking status adressed , most recent labs reviewed I have personally reviewed and have noted 1.The patient's medical and social history 2.Their use of alcohol, tobacco or illicit drugs 3.Their current medications and supplements 4. Functional ability including ADL's, fall risk, home safety risk, hearing & visual impairment  5.Diet and physical activities 6.Evidence for depression or mood disorders The patients weight, height, BMI  have been recorded in the chart I have  made referrals, counseling and provided education to the patient based review of the above  Orders: TLB-BMP (Basic Metabolic Panel-BMET) (80048-METABOL) TLB-CBC Platelet - w/Differential (85025-CBCD) TLB-Hepatic/Liver Function Pnl (80076-HEPATIC) TLB-Lipid Panel (80061-LIPID) TLB-TSH (Thyroid Stimulating Hormone) (84443-TSH) TLB-Udip ONLY (81003-UDIP)  Colonoscopy: Adenomatous Polyp (09/10/2003) Bone Density: abnormal (03/06/2005) Flu Vax: given (12/14/2009)   Chol: 173 (11/02/2009)   HDL: 41.60 (11/02/2009)   LDL: 103 (11/02/2009)   TG: 142.0 (11/02/2009) TSH: 1.26 (07/03/2007)   Declines colonscopy and dxa  Problem # 2:  FOOT PAIN, LEFT (ICD-729.5) c/w DJD first MTP , plantar aspect pain only - for voltaren gel as needed, to podiatry if not helped  Complete Medication List: 1)  Toprol Xl 50 Mg Tb24 (Metoprolol succinate) .... Take 1 tablet by mouth once a day 2)  Triamterene-hctz 37.5-25 Mg Tabs (Triamterene-hctz) .... Take 1 tablet by mouth once a day 3)  Ramipril 10 Mg Caps (Ramipril) .Marland Kitchen.. 1 cap two times a day 4)  Ecotrin Low Strength 81 Mg Tbec (Aspirin) .... Take 1 tablet by mouth once a day 5)  Zocor 40 Mg Tabs (Simvastatin) .... Take 1 tablet daily 6)  Zantac 150 Mg Tabs (Ranitidine hcl) .Marland Kitchen.. 1 tab as needed 7)  Aleve 220 Mg Tabs (Naproxen sodium) .... As needed 8)  Voltaren 1 % Gel (Diclofenac sodium) .... Use asd two times a day as needed  Patient Instructions: 1)  you had the pneumonia shot today 2)  Please go to the Lab in the basement for your blood and/or urine tests today 3)  Please call the number on the [facility] Card for results of your testing  4)  Please take all new medications as prescribed 5)  Continue all previous medications as before this visit  6)  Please call if the Voltaren gel does not seem to help, for a podiatry referral 7)  Please schedule a follow-up appointment in 1 year, or sooner if needed Prescriptions: VOLTAREN 1 % GEL (DICLOFENAC SODIUM) use asd  two times a day as needed  #1large x 1   Entered and Authorized by:   Corwin Levins MD   Signed by:   Corwin Levins MD on 04/21/2010   Method used:   Print then Give to Patient   RxID:   [id number]    Orders Added: 1)  TLB-BMP (Basic Metabolic Panel-BMET) [80048-METABOL] 2)  TLB-CBC Platelet - w/Differential [85025-CBCD] 3)  TLB-Hepatic/Liver Function Pnl [80076-HEPATIC] 4)  TLB-Lipid Panel [80061-LIPID] 5)  TLB-TSH (Thyroid Stimulating Hormone) [84443-TSH] 6)  TLB-Udip ONLY [81003-UDIP] 7)  Est. Patient 65& > [52841]

## 2010-06-03 ENCOUNTER — Other Ambulatory Visit: Payer: Self-pay | Admitting: Cardiology

## 2010-06-28 NOTE — Assessment & Plan Note (Signed)
[facility] HEALTHCARE                            CARDIOLOGY OFFICE NOTE   ABRI, VACCA               MRN:          [id number]  DATE:01/22/2008                            DOB:          [birth year]    Ms. Schult returns today for followup.   As usual, she is very optimistic and very positive in general.  She  offers no specific complaints.  Her biggest concern is that she is  having more problems with her balance and walks with a cane.  Her blood  work and her general care has been provided by Dr. Jonny Ruiz.   She specifically denies any angina or ischemia equivalents.  She has had  previous coronary artery disease diagnosed with a stent, the right  coronary artery and 2 subsequent repeat interventions for restenosis,  last one in 2001.  She has had a previous inferior wall infarct.  We  have been treating her medically and based on symptomatology.   She has a history of hypertension and hyperlipidemia.  Again, followed  by Dr. Jonny Ruiz.  Her lipids have been at goal.   MEDICATIONS:  1. Toprol-XL 50 mg a day.  2. Triamterene and HCTZ 37.5/25 daily.  3. Altace 10 mg p.o. b.i.d.  4. Enteric-coated aspirin 81 mg a day.  5. Cosopt.  6. Alphagan eyedrops.  7. Xalatan eyedrops.  8. Boniva 150 mg every Monday.  9. Zocor 80 mg per day.  10.Allopurinol 100 mg per day.   PHYSICAL EXAMINATION:  VITAL SIGNS:  Her blood pressure today is 120/54,  pulse 68 and regular, and her weight stable at 248.  HEENT:  Essentially normal.  NECK:  Supple.  Carotid upstrokes were equal bilaterally without bruits.  There is no JVD.  Thyroid is not enlarged.  Trachea is midline.  LUNGS:  Clear to auscultation and percussion.  HEART:  Regular rate and rhythm.  No gallop or rub.  ABDOMEN:  Soft, good bowel sounds.  No midline bruit.  EXTREMITIES:  No cyanosis, clubbing, or edema.  Pulses are intact.  NEURO:  Intact.   Ms. Mcglown is stable from my standpoint.  I have  made no changes in  the medical program.  I will see her back again in a year.     Thomas C. Daleen Squibb, MD, [facility]  Electronically Signed    TCW/MedQ  DD: 01/22/2008  DT: 01/22/2008  Job #: [id number]

## 2010-06-28 NOTE — Assessment & Plan Note (Signed)
[facility] HEALTHCARE                            CARDIOLOGY OFFICE NOTE   Beth Gutierrez, Beth Gutierrez               MRN:          [id number]  DATE:07/18/2007                            DOB:          1921-12-06    Ms. Mosley returns today for follow-up.   PROBLEM LIST:  1. Coronary artery disease.  She has had previous stenting of the      right coronary artery and two subsequent repeat intervention for      restenosis, the last one in April 2001.  She has had a previous      inferior wall infarct.  She is having no angina or ischemic      equivalents.  She has not had any objective assessment of her      coronary disease since that time.  2. Hypertension.  3. Hyperlipidemia.  She is followed by Dr. Jonny Ruiz.  Her lipids were goal      last year.   She complains mostly of chronic arthritis, which moves around.  She  walks with a cane and she says she is in constant pain.  She is  extremely pleasant, however.   MEDICATION:  1. Toprol XL 50 mg a day.  2. Triamterene/hydrochlorothiazide 37.5/25 mg daily.  3. Altace 10 mg p.o. b.i.d.  4. Aspirin 81 mg a day.  5. Cosopt and Alphagan eye drops.  6. Xalatan eye drops.  7. Boniva 150 mg each Monday.  8. Zocor 80 mg a day.  9. Allopurinol 100 mg a day.   Her blood pressure today is 178/68.  She says it is always up at the  doctor's.  She has an Charity fundraiser who checks her blood pressure every Saturday  and it is usually in the 120s-130s systolic.  Her pulse is 72 and  regular.  EKG is normal.  HEENT:  Unchanged.  Carotid upstrokes are equal bilaterally without  bruits, no JVD.  Thyroid is not enlarged.  Trachea is midline.  LUNGS:  Clear.  HEART:  A regular rate and rhythm.  No gallop or rub.  ABDOMEN:  Soft, good bowel sounds.  No midline bruit.  EXTREMITIES:  No cyanosis, clubbing or edema.  Pulses are intact.  NEUROLOGIC:  Intact.   Ms. Triplett is doing well.  I have made no changes in her medical  program.   We will plan on seeing her back again in a year.     Thomas C. Daleen Squibb, MD, [facility]  Electronically Signed    TCW/MedQ  DD: 07/18/2007  DT: 07/18/2007  Job #: [id number]

## 2010-06-28 NOTE — Assessment & Plan Note (Signed)
[place] [facility]                         GASTROENTEROLOGY OFFICE NOTE   Beth Gutierrez, Beth Gutierrez               MRN:          [id number]  DATE:08/22/2006                            DOB:          1921-06-21    HISTORY OF PRESENT ILLNESS:  Beth Gutierrez presents today regarding  surveillance colonoscopy.  Her initial colonoscopy was performed in  2002.  She was found to have a villous adenoma of the ascending colon.  Followup examinations were performed again in 2002, 2003, and 2005.  At  the time of her most recent exam in 2005 she was found to have a 5-mm  sessile polyp in the ascending colon which was removed.  Minor sigmoid  diverticulosis.  No other abnormalities.  The preparation was excellent.  Overall, the patient's health has been stable.  Unfortunately, her  functional status has declined due to osteoarthritis and aging.  She  would prefer forgoing a surveillance exam if reasonable.  The patient  denies nausea, vomiting, abdominal pain, melena, hematochezia, change in  bowel habits, or weight loss.   CURRENT MEDICATIONS:  Include Toprol-XL,  triamterine/hydrochlorothiazide, Altace, aspirin, three types of  eyedrops, Boniva, Zocor, and allopurinol.  She is allergic to SULFA.   No family history of colon cancer.   Social history per diagnostic evaluation form, as well as review of  systems.   PHYSICAL EXAMINATION:  Elderly female, no acute distress.  Blood  pressure 150/60, heart rate 60, weight is 247 pounds.  She is 5 feet 2  inches in height.  HEENT:  Sclerae anicteric, conjunctivae are slightly pale.  Oral mucosa  is intact.  LUNGS:  Clear.  HEART:  Regular.  ABDOMEN:  Obese and soft without tenderness, mass or hernia.   IMPRESSION:  An 77 year old female presents today regarding surveillance  colonoscopy.  Prior exam revealing diverticulosis and a diminutive colon  polyp which was removed.  At this point we had a frank discussion  regarding the potential benefits and risks of colonoscopy at this time.  Given her age, difficulty with mobility, and her personal preference, as  well as lack of any active GI symptoms, it was mutually decided that we  would forego surveillance at this time and only plan on intervention  should clinical problems arise.     Beth Gutierrez. Beth Goodell, MD  Electronically Signed    JNP/MedQ  DD: 08/22/2006  DT: 08/23/2006  Job #: [id number]   cc:   Corwin Levins, MD

## 2010-06-28 NOTE — Assessment & Plan Note (Signed)
[facility] HEALTHCARE                            CARDIOLOGY OFFICE NOTE   DESTONY, PREVOST               MRN:          [id number]  DATE:10/09/2006                            DOB:          13-Feb-1922    Ms. Lanzo comes today to establish with me as her new cardiologist.  She has been a former patient of Dr. Randa Evens.  Is also followed  by Dr. Oliver Barre of our group.   PROBLEM LIST:  1. Coronary artery disease.  She has had previous stenting of the      right coronary artery and 2 subsequent repeat interventions for      restenosis, the last of which was April 2001.  She has done well      without any exertional angina or discomfort.  She remains stable      today.  She has not had any objective assessment of her coronary      artery disease since that time.  2. Hypertension.  3. Hyperlipidemia.  Her lipids are at goal with Dr. Jonny Ruiz.   Her biggest complaint today is hurting all over.  She has chronic  arthritis and has to take Extra Strength Tylenol.  When she takes  anything stronger, such as Darvocet, she starts hearing people in her  house.  I have advised her not to take anything too strong for fear of  falling or confusion.  She is a delightful lady.  Her son is here today,  who is an Art gallery manager at [employer].   MEDICATIONS:  1. Toprol XL 50 mg a day.  2. Triamterine hydrochlorothiazide 37.5/25 daily.  3. Altace 10 mg p.o. b.i.d.  4. Enteric-coated aspirin 81 mg a day.  5. Eye drops.  6. Boniva 150 mg p.o. q. a.m.  7. Zocor 80 mg a day.  8. Allopurinol 100 mg a day.   EXAM:  Her blood pressure today is 130/72, pulse 64 and regular, weight  244, down 8.  HEENT:  Normocephalic, atraumatic.  PERRLA.  Extraocular muscles are  intact.  Sclerae clear.  Facial symmetry is normal.  NECK:  Supple.  Carotids are equal bilaterally with a soft systolic  sound at the right base.  Thyroid is not enlarged.  Trachea is midline.  LUNGS:  Clear.  HEART:  Reveals a nondisplaced PMI.  She has a normal S1, S2 with soft  systolic murmur.  S2 splits physiologically.  ABDOMEN:  Soft with good bowel sounds.  EXTREMITIES:  No edema.  Pulses are present.  NEURO:  Grossly intact.  She walks with a cane.   ELECTROCARDIOGRAM:  Shows normal sinus rhythm with low voltage.  There  has been no change from before.   I have had a nice chat with Mrs. Favorite and her son.  We will  continue with her current medical program.  I think the biggest risk to  her is falling or stroke.  She is on an excellent program to prevent  that.  We will plan on seeing her back in a year.     Thomas C. Daleen Squibb, MD, [facility]  Electronically Signed    TCW/MedQ  DD: 10/09/2006  DT: 10/09/2006  Job #: [id number]

## 2010-07-01 NOTE — Cardiovascular Report (Signed)
[place]. [facility]  Patient:    Beth Gutierrez, Beth Gutierrez               MRN: [id number] Proc. Date: 06/07/99 Adm. Date:  [id number] Attending:  Daisey Must CC:         Dortha Kern, Montez Hageman., M.D.             Daisey Must, M.D. [facility]             Cardiac Catheterization Laboratory                        Cardiac Catheterization  PROCEDURES PERFORMED: 1. Left heart catheterization with coronary angiography and left    ventriculography. 2. Percutaneous transluminal coronary angioplasty with cutting balloon for    in-stent re-stenosis. 3. Coronary stent placement in the mid right coronary artery.  INDICATIONS:  Ms. Vicars is a 77 year old woman who underwent primary angioplasty and stent placement in March of 2000 for an acute inferior myocardial infarction.  In October of last year she was found to have in-stent re-stenosis and this was treated with balloon angioplasty.  A followup Cardiolite scan done showed the presence of moderate inferolateral ischemia. We thus opted to proceed with cardiac catheterization.  CATHETERIZATION PROCEDURE:  A 6 French sheath was placed in the right femoral artery.  Standard Judkins 6 French catheters were utilized.  Contrast was Omnipaque.  There were no complications.  CATHETERIZATION RESULTS:  HEMODYNAMICS:  Left ventricular pressure 168/14, aortic pressure 180/78. There was no aortic valve gradient.  LEFT VENTRICULOGRAM:  There is mild hypokinesis of the inferior wall.  Minimal akinesis of the apex.  Ejection fraction calculated at 61%.  No mitral regurgitation.  CORONARY ARTERIOGRAPHY:  (Right dominant).  Left main:  Left main has an ostial 25% stenosis and a distal 25% stenosis.  Left anterior descending:  The LAD has a mid 50% stenosis.  The LAD gives rise to a large first diagonal which has a 60% stenosis at its origin.  Left circumflex:  The left circumflex gives rise to a large ramus intermedius, small  OM-1, small OM-2 and a normal OM-3.  The circumflex has a 25% stenosis in the proximal vessel and 25% stenosis in the mid vessel.  Right coronary artery:  The right coronary artery has a 40% stenosis proximal to the stent.  Within the proximal portion of the stent in the proximal vessel is a 75% stenosis.  In the midportion of the stent in the mid vessel is a 60% stenosis.  Just beyond the stent in the mid vessel is a 40% stenosis.  In the distal right coronary is a diffuse 25% stenosis.  There is a large posterior descending artery, normal first posterolateral branch and a small second posterolateral branch.  IMPRESSION: 1. Preserved left ventricular systolic function with mild wall motion    abnormalities as described. 2. Two-vessel coronary artery disease.  There is significant in-stent    re-stenosis in the proximal and mid right coronary artery.  The disease    in the left anterior descending and diagonal appear to be stable from    previous catheterization and nonobstructive.  PLAN:  For percutaneous intervention to the right coronary artery.  See below.  PERCUTANEOUS TRANSLUMINAL CORONARY ANGIOPLASTY PROCEDURE:  Pre-existing 6 French sheath in the right femoral artery is exchanged over a wire for an 8 [place] sheath.  We used an 8 [place] guiding catheter with side holes and a BMW wire.  The patient was enrolled in a Cruise trial and randomized to Integrilin plus heparin.  We treated the in-stent re-stenosis with a 3.5 x 15 mm cutting balloon.  This was positioned in the distal aspect of the stent which the stent itself being 32 mm long.  The balloon was inflated to 6, then 8, then 10 atmospheres.  The balloon was then pulled back to the proximal aspect of the stent and inflated to 6, then 8, then 10 atmospheres.  This achieved improved lumen.  The distal vessel appeared to be somewhat larger than the area within the stent.  We then used a 3.75 x 20 mm Quantum Ranger and  positioned it within the distal aspect of the stent, inflated to 18 atmospheres.  We pulled it back to the proximal area of the stent and inflated it to 20 atmospheres.  Angiographic images revealed a significant improvement within the stent.  However, just distal to the stent where there was a previously 40% stenosis, there was a new dissection.  It was therefore necessary to cover this dissection with a new stent.  We used a 3.5 x 15 mm NIR Elite stent which was positioned just distal to the previously placed stent with minimal overlap and deployed at 13 atmospheres.  Final angiographic images revealed patency of the vessel.  The proximal area of 75% in-stent re-stenosis was reduced to 25% stenosis.  The mid in-stent 60% was reduced to 25% stenosis.  The area distal to the stent which was previously 40% was reduced to 0% residual with TIMI-3 flow.  COMPLICATIONS:  None.  RESULTS:  Successful PTCA for in-stent re-stenosis with placement of a new stent distal to the previous stent.  As described above, a proximal 75% stenosis was reduced to 25%.  A mid 60% was reduced to 25% and a further down just distal to the stent a 40% stenosis was reduced to 0% residual with TIMI-3 flow.  PLAN:  Integrilin will be continued for 18 hours.  Plavix will be administered for four weeks.  If the patient has recurrent in-stent re-stenosis would consider the use of brachytherapy at that time.  Because it was necessary to place a new stent during this procedure, we opted not to use brachytherapy at this time. DD:  06/07/99 TD:  06/07/99 Job: [id number]

## 2010-07-01 NOTE — Op Note (Signed)
[place]. [facility]  Patient:    Beth Gutierrez                MRN: [id number] Proc. Date: 12/27/98 Adm. Date:  [id number] Attending:  Colbert Ewing                           Operative Report  PREOPERATIVE DIAGNOSIS:  End-stage degenerative joint disease, left hip.  POSTOPERATIVE DIAGNOSIS:  End-stage degenerative joint disease, left hip.  PROCEDURE:  Left total hip replacement utilizing Osteonics prosthesis. Press-fit 50 mm acetabular component with 10 degree/28 mm polyethylene insert.  Screw fixation x two.  Cemented EON+ femoral component with a 14 mm distal tip, 127 degree neck and -3 head.  SURGEON:  Loreta Ave, M.D.  ASSISTANT:  Arlys John D. Petrarca, P.A.-C.  ANESTHESIA:  General.  BLOOD LOSS:  500 cc.  BLOOD GIVEN:  None.  SPECIMEN:  Excised bone and soft tissue.  CULTURES:  None.  COMPLICATIONS:  None.  DRAINS:  None.  DRESSING:  Soft compressive.  DESCRIPTION OF PROCEDURE:  Patient brought to the operating room, placed on the  operating table in supine position.  After adequate anesthesia had been obtained, turned to a lateral position, left side up.  Prepped and draped in usual sterile fashion.  Straight incision along the femur, curving posterior-superior.  Skin nd subcutaneous tissue divided.  Hemostasis obtained with electrocautery.  The iliotibial band exposed and incised.  Charnley retractor put in place. Neurovascular structures identified and protected.  External rotators and capsule taken down off the back of the intertrochanteric groove and tagged with nonabsorbable #1 Ethibond.  The hip dislocated.  Grade IV changes throughout. Femoral head removed one fingerbreadth above the lesser trochanter in line with a definitive component.  Acetabulum exposed.  Marked changes throughout.  Soft tissue removed.  Sequential reaming with appropriate inferior and medialization. Sized for a 50 mm component.   Cup put in place, placed in 45 degrees of abduction, 20 of anteversion.  Good capturing and fixation.  Further fixation with a 20 mm screw  above and a 16 mm screw posterosuperior.  A 28 mm/10 degree insert placed with he overhang placed posterosuperior.  Retractors are removed.  Femur exposed. Sequential reaming proximal and distal for a cemented #8 component.  With trials in place utilizing a #8 component with 127 degree neck and -3 head, I had good restoration of leg lengths and excellent stability in flexion and extension. All trials removed.  Cement restrictor placed 2 cm distal to the tip.  Copious irrigation with a pulse irrigating device.  Cement prepared and placed in the canal under pressure.  Definitive component placed down in the canal and held until cement hardened.  Excessive cement having been removed.  The femoral head attached with the -3 head.  Hip was then reduced.  Excellent stability in flexion/extension.  Brought to 90 of flexion, more than 20 of adduction and 60 of internal rotation before dislocating.  Stable in extension as well.  Wound irrigated.  Leg lengths accessed and found to be excellent.  External rotators and capsule repaired through drill holes in bone overtying these over bone at the back of the intertrochanteric groove.  Wound irrigated.  Charnley retractor removed.  The iliotibial band closed with #1 Vicryl, skin and subcutaneous tissue with Vicryl and staples.  Margins f the wound injected with Marcaine.  Sterile compressive dressing applied, returned to the supine position, abduction  pillow placed.  Anesthesia reversed, brought o recovery room, tolerated surgery well, no complications. DD:  12/27/98 TD:  12/28/98 Job: [id number]

## 2010-07-01 NOTE — Discharge Summary (Signed)
[place]. [facility]  Patient:    Beth Gutierrez                MRN: [id number] Adm. Date:  [id number] Disc. Date: 01/08/99 Attending:  Herold Harms Dictator:   Mcarthur Rossetti. Angiulli, P.A. CC:         Daniel L. Thomasena Edis, M.D.             Loreta Ave, M.D.             Sharyn Dross., M.D.                           Discharge Summary  DISCHARGE DIAGNOSES: 1. Left total hip replacement December 27, 1998. 2. Postoperative anemia. 3. Hypertension. 4. Arteriosclerotic coronary heart disease with myocardial infarction.  HISTORY OF PRESENT ILLNESS:  The patient is a 77 year old female admitted November 13 with longstanding left hip pain. X-rays with advanced osteoarthritis. No relief with conservative care. She underwent a left total hip replacement November 13 er Loreta Ave, M.D. She was placed on Coumadin for deep vein thrombosis prophylaxis. Weightbearing as tolerated. Ambulating 15 feet with supervision. No chest pain or shortness of breath. No voiding difficulty. Pain management ongoing with Vicodin. Latest INR of 1.8, hemoglobin 9.4. Chemistries unremarkable. EKG normal sinus rhythm with anterior infarction with age undetermined. Admitted for a comprehensive rehab program.  PAST MEDICAL HISTORY:  See discharge diagnoses.  PAST SURGICAL HISTORY:  Bilateral cataract surgery.  ALLERGIES:  SULFA.  SOCIAL HISTORY:  Remote smoker. No alcohol. He lives alone in [place]. Independent prior to admission. One-level home, two steps to entry. Son in [place]. Sister in [place].  PRIMARY CARE PHYSICIAN:  Sharyn Dross., M.D.  CARDIOLOGIST:  Daisey Must, M.D.  MEDICATIONS PRIOR TO ADMISSION: 1. Altace 10 mg daily. 2. Zocor 20 mg daily. 3. Toprol 50 mg daily. 4. Aspirin daily.  HOSPITAL COURSE:  The patient did well while in rehabilitation services with therapies initiated on a b.i.d. basis. The following issues were  followed during patients rehab course. Pertaining to Ms. Mathersons left total hip replacement,  it remains stable. Surgical site healing nicely. No signs of infection. Staples  have been removed. She was weightbearing as tolerated with walker. She continued on Coumadin for deep vein thrombosis prophylaxis. Venous Doppler studies prior to er discharge were negative. She will complete Coumadin protocol 10 more days of Coumadin on discharge. Postoperative anemia remained stable with CBC day prior o discharge November 24 of 9.7, hematocrit 30.5. She did have some elevated white  blood cell counts of 15,000. Urinalysis was negative. Hip incision showed no signs of infection. Lungs clear to auscultation. She was placed on a 7-day course of empiric Levaquin. This was completed on January 06, 1999. She remained afebrile. Her latest white blood cell count had improved to 13,000. She would follow up with her orthopedic surgeon. Blood pressures remained controlled on Toprol and Altace. No orthostatic hypotension noted. She had no chest pain or shortness of breath. No voiding difficulties. Overall, for her functional mobility, she was ambulating household functional distances with a walker. Essentially independent to stand-by assist in all areas of activities of daily living, dressing, grooming, and homemaking. Overall, her strength and endurance greatly improved. She was encouraged at her overall progress and discharged to home.  LATEST LABORATORY DATA:  Showed an INR of 2.6, hemoglobin 9.7. Chemistries unremarkable with a sodium 139, potassium 4, BUN 9, creatinine  0.6.  DISCHARGE MEDICATIONS: 1. Coumadin daily with dose to be established at time of discharge x 10 more days. 2. Zocor 20 mg daily. 3. Trinsicon one capsule twice daily. 4. Toprol XL 50 mg daily. 5. Altace 10 mg daily. 6. Vicodin one to two tablets every four to six hours as needed pain.  ACTIVITIES:  Weightbearing as  tolerated with a walker.  DIET:  Regular.  WOUND CARE:  Cleanse incision daily warm soap and water. Call Loreta Ave,  M.D. for any increased redness, drainage or fever.  SPECIAL INSTRUCTIONS:  No aspirin or ibuprofen while on Coumadin. Home health physical therapy. The patient advised to resume her aspirin therapy prior to hospital admission three days after Coumadin completed.  FOLLOW-UP:  Sharyn Dross., M.D. medical management. DD:  01/07/99 TD:  01/07/99 Job: [id number]

## 2010-07-01 NOTE — Discharge Summary (Signed)
[place]  Patient:    Beth Gutierrez, Beth Gutierrez               MRN: [id number] Adm. Date:  [id number] Disc. Date: 06/08/99 Attending:  Daisey Must Dictator:   Jacolyn Reedy, P.A.C. CC:         Sharyn Dross., M.D.                           Discharge Summary  ADMITTING DIAGNOSIS:  Abnormal Cardiolite.  DISCHARGE DIAGNOSES: 1. Status post percutaneous transluminal coronary angioplasties and stenting    for in stent restenosis of the right coronary artery on June 07, 1999. 2. History of diaphragmatic myocardial infarction treated with percutaneous    transluminal coronary angioplasties and stenting of the right coronary    artery in March 2000. 3. Status post percutaneous transluminal coronary angioplasties for in stent    restenosis of the right coronary artery in October 2000. 4. Hypertension 5. Hyperlipidemia. 6. History of left total replacement.  BRIEF HISTORY AND PHYSICAL:  Please see H&P for details.  This is a 76 year old female patient who had an inferior wall myocardial infarction in march of 2000, treated with PTCA and stent placement to the RCA.  She then had a Persantine Cardiolite at 6 months which showed inferior ischemia and repeated cardiac catheterization November 16, 1998, revealed diffuse 75% in stent restenosis.  She was treated with PTCA.  She now has a follow up Persantine Cardiolite May 31, 1999, that showed a small apical lateral infarction with moderate inferior lateral wall ischemia at the mid base level, ejection fraction 56%.  The patient was referred for a repeat cardiac catheterization for probable in stent restenosis.  HOSPITAL COURSE:  Cardiac catheterization by Dr. Gerri Spore revealed 60% in stent restenosis.  She also had 60% diagonal, 50% LAD, scattered 25% lesions in the circumflex.  She underwent PTCA for in stent restenosis of the proximal RCA, and a new stent in the mid RCA distal to previous stent  without difficulty.  She had mild inferior hypokinesis, ejection fraction 61%, minimal global akinesis.  The patient tolerated the procedure well, groin was stable, and she was discharged home the next day in stable condition.  LABORATORY DATA:  Hemoglobin 13, hematocrit 38, white count 11.3, platelets 304, coags within normal limits.  Sodium 142, potassium 4.3, chloride 112, CO2 29, BUN 14, creatinine 0.6.  LFTs within normal limits.  Cholesterol 173, triglycerides 99, HDL 20, LDL 86.  EKG:  Sinus bradycardia otherwise normal EKG.  DISPOSITION:  The patient is discharged home in stable condition on the following medications.  DISCHARGE MEDICATIONS: 1. Plavix 75 mg once a day for 4 weeks with food. 2. Coated aspirin once a day. 3. Toprol XL 50 mg once a day. 4. Zocor 20 mg once a day. 5. Altace 10 mg once a day. 6. Dyazide 25/37.5 once a day. 7. Nitroglycerin p.r.n.  ACTIVITY:  She is to do no heavy lifting, strenuous activity for 2-3 days.  DIET:  Follow a low fat diet.  FOLLOWUP:  Follow up with Dr. Gerri Spore Jul 01, 1999, at 11 oclock. DD:  06/08/99 TD:  06/08/99 Job: [id number]

## 2010-09-13 ENCOUNTER — Encounter: Payer: Self-pay | Admitting: Cardiology

## 2010-09-18 ENCOUNTER — Other Ambulatory Visit: Payer: Self-pay | Admitting: Cardiology

## 2010-09-20 ENCOUNTER — Encounter: Payer: Self-pay | Admitting: Cardiology

## 2010-09-20 ENCOUNTER — Ambulatory Visit (INDEPENDENT_AMBULATORY_CARE_PROVIDER_SITE_OTHER): Payer: Medicare Other | Admitting: Cardiology

## 2010-09-20 VITALS — BP 133/62 | HR 73 | Resp 14 | Ht 66.0 in | Wt 238.0 lb

## 2010-09-20 DIAGNOSIS — I509 Heart failure, unspecified: Secondary | ICD-10-CM

## 2010-09-20 DIAGNOSIS — I2510 Atherosclerotic heart disease of native coronary artery without angina pectoris: Secondary | ICD-10-CM

## 2010-09-20 NOTE — Assessment & Plan Note (Signed)
Stable. No change in treatment. 

## 2010-09-20 NOTE — Patient Instructions (Signed)
Your physician recommends that you schedule a follow-up appointment in: 1 year with Dr. Daleen Squibb  Continue to have your yearly cholesterol labs checked with your primary care doctor

## 2010-09-20 NOTE — Progress Notes (Signed)
HPI Beth Gutierrez returns today for evaluation and management of her coronary artery disease. He is having no angina or ischemic symptoms though she is extremely limited by arthritis.  Comply with her medications. Her blood pressures but under good control. She denies any edema or shift in her weight. She denies any palpitations.  Her EKG shows normal sinus rhythm with PACs and PVCs. Past Medical History  Diagnosis Date  . Acute myocardial infarction, unspecified site, episode of care unspecified   . Congestive heart failure, unspecified   . Coronary atherosclerosis of unspecified type of vessel, native or graft   . Unspecified essential hypertension   . Other and unspecified hyperlipidemia   . Esophageal reflux   . Depressive disorder, not elsewhere classified   . Morbid obesity   . Need for prophylactic vaccination and inoculation against influenza   . Allergic rhinitis, cause unspecified   . Other malaise and fatigue   . Disorder of bone and cartilage, unspecified   . Diverticulosis of colon (without mention of hemorrhage)   . Benign neoplasm of colon   . Gout, unspecified   . Unspecified glaucoma   . Osteoarthrosis, unspecified whether generalized or localized, unspecified site     Past Surgical History  Procedure Date  . Coronary angioplasty with stent placement     RCA stent  . Vesicovaginal fistula closure w/ tah   . Left hip replacement     s/p 2002. Secondary to DJD  . Ectopic pregnancy surgery     Family History  Problem Relation Age of Onset  . Breast cancer Sister   . Hypertension Father   . Stroke Father     History   Social History  . Marital Status: Widowed    Spouse Name: N/A    Number of Children: 2  . Years of Education: N/A   Occupational History  . retired [employer]    Social History Main Topics  . Smoking status: Former Games developer  . Smokeless tobacco: Not on file  . Alcohol Use: No  . Drug Use: No  . Sexually Active: Not on  file   Other Topics Concern  . Not on file   Social History Narrative   Lives alone    Allergies  Allergen Reactions  . Sulfonamide Derivatives     Current Outpatient Prescriptions  Medication Sig Dispense Refill  . aspirin (ECOTRIN LOW STRENGTH) 81 MG EC tablet Take 81 mg by mouth daily.        . diclofenac sodium (VOLTAREN) 1 % GEL Apply 1 application topically 2 (two) times daily as needed.        . metoprolol (TOPROL-XL) 50 MG 24 hr tablet Take 50 mg by mouth daily.        . Naproxen Sodium (ALEVE) 220 MG CAPS as needed.        . ramipril (ALTACE) 10 MG capsule Take 10 mg by mouth 2 (two) times daily.        . ranitidine (ZANTAC) 150 MG tablet Take 150 mg by mouth as needed.        . simvastatin (ZOCOR) 40 MG tablet TAKE 1 TABLET BY MOUTH EVERY DAY  30 tablet  11  . triamterene-hydrochlorothiazide (MAXZIDE-25) 37.5-25 MG per tablet TAKE 1 TABLET BY MOUTH ONCE A DAY  30 tablet  4    ROS Negative other than HPI.   PE General Appearance: well developed, well nourished in no acute distress, obese, using a cane HEENT: symmetrical face, PERRLA, good  dentition  Neck: no JVD, thyromegaly, or adenopathy, trachea midline Chest: symmetric without deformity Cardiac: PMI poorly appreciated,RRR, normal S1, S2, no gallop or murmur Lung: clear to ausculation and percussion Vascular: all pulses full without bruits  Abdominal: nondistended, nontender, good bowel sounds, no HSM, no bruits Extremities: no cyanosis, clubbing or edema, no sign of DVT, no varicosities  Skin: normal color, no rashes Neuro: alert and oriented x 3, non-focal Pysch: normal affect Filed Vitals:   09/20/10 0929  BP: 133/62  Pulse: 73  Resp: 14  Height: 5\' 6"  (1.676 m)  Weight: 238 lb (107.956 kg)    EKG  Labs and Studies Reviewed.   Lab Results  Component Value Date   WBC 11.1* 04/21/2010   HGB 10.8* 04/21/2010   HCT 32.7* 04/21/2010   MCV 96.1 04/21/2010   PLT 274.0 04/21/2010      Chemistry        Component Value Date/Time   NA 142 04/21/2010 1510   K 5.3* 04/21/2010 1510   CL 110 04/21/2010 1510   CO2 25 04/21/2010 1510   BUN 35* 04/21/2010 1510   CREATININE 1.5* 04/21/2010 1510      Component Value Date/Time   CALCIUM 8.7 04/21/2010 1510   ALKPHOS 68 04/21/2010 1510   AST 13 04/21/2010 1510   ALT 10 04/21/2010 1510   BILITOT 0.4 04/21/2010 1510       Lab Results  Component Value Date   CHOL 152 04/21/2010   CHOL 173 11/02/2009   CHOL 155 02/18/2009   Lab Results  Component Value Date   HDL 37.70* 04/21/2010   HDL 40.98 11/02/2009   HDL 11.91 02/18/2009   Lab Results  Component Value Date   LDLCALC 77 04/21/2010   LDLCALC 103* 11/02/2009   LDLCALC 75 02/18/2009   Lab Results  Component Value Date   TRIG 189.0* 04/21/2010   TRIG 142.0 11/02/2009   TRIG 191.0* 02/18/2009   Lab Results  Component Value Date   CHOLHDL 4 04/21/2010   CHOLHDL 4 11/02/2009   CHOLHDL 4 02/18/2009   No results found for this basename: HGBA1C   Lab Results  Component Value Date   ALT 10 04/21/2010   AST 13 04/21/2010   ALKPHOS 68 04/21/2010   BILITOT 0.4 04/21/2010   Lab Results  Component Value Date   TSH 0.95 04/21/2010

## 2010-10-31 ENCOUNTER — Other Ambulatory Visit: Payer: Self-pay | Admitting: Cardiology

## 2011-01-04 ENCOUNTER — Encounter: Payer: Self-pay | Admitting: Internal Medicine

## 2011-01-04 DIAGNOSIS — Z0000 Encounter for general adult medical examination without abnormal findings: Secondary | ICD-10-CM | POA: Insufficient documentation

## 2011-01-06 ENCOUNTER — Encounter: Payer: Self-pay | Admitting: Internal Medicine

## 2011-01-06 ENCOUNTER — Ambulatory Visit (INDEPENDENT_AMBULATORY_CARE_PROVIDER_SITE_OTHER): Payer: Medicare Other | Admitting: Internal Medicine

## 2011-01-06 VITALS — BP 116/62 | HR 70 | Temp 97.8°F | Wt 231.0 lb

## 2011-01-06 DIAGNOSIS — M25569 Pain in unspecified knee: Secondary | ICD-10-CM

## 2011-01-06 DIAGNOSIS — F329 Major depressive disorder, single episode, unspecified: Secondary | ICD-10-CM

## 2011-01-06 DIAGNOSIS — I10 Essential (primary) hypertension: Secondary | ICD-10-CM

## 2011-01-06 DIAGNOSIS — M25561 Pain in right knee: Secondary | ICD-10-CM | POA: Insufficient documentation

## 2011-01-06 MED ORDER — TRAMADOL HCL 50 MG PO TABS: 50.0000 mg | ORAL_TABLET | Freq: Four times a day (QID) | ORAL | Status: AC | PRN

## 2011-01-06 NOTE — Progress Notes (Signed)
Subjective:    Patient ID: Beth Gutierrez, female    DOB: 1921-12-21, 77 y.o.   MRN: [id number]  HPI here with 1 wk on onset right dull and achy knee pain without swelling, but worse in the past wk, has known DJD, heating pad helps and walks with cane (new for her);  Seems more unstable in the past few days but no giveaway yet or falls in the past wk;   Pt denies chest pain, increased sob or doe, wheezing, orthopnea, PND, increased LE swelling, palpitations, dizziness or syncope.  Pt denies new neurological symptoms such as new headache, or facial or extremity weakness or numbness   Pt denies polydipsia, polyuria,  Pt states overall good compliance with meds, trying to follow lower cholesterol, diabetic diet, wt overall stable but little exercise however.   Denies worsening depressive symptoms, suicidal ideation, or panic, though has ongoing anxiety, not increased recently.  Past Medical History  Diagnosis Date  . Acute myocardial infarction, unspecified site, episode of care unspecified   . Congestive heart failure, unspecified   . Coronary atherosclerosis of unspecified type of vessel, native or graft   . Unspecified essential hypertension   . Other and unspecified hyperlipidemia   . Esophageal reflux   . Depressive disorder, not elsewhere classified   . Morbid obesity   . Need for prophylactic vaccination and inoculation against influenza   . Allergic rhinitis, cause unspecified   . Other malaise and fatigue   . Disorder of bone and cartilage, unspecified   . Diverticulosis of colon (without mention of hemorrhage)   . Benign neoplasm of colon   . Gout, unspecified   . Unspecified glaucoma   . Osteoarthrosis, unspecified whether generalized or localized, unspecified site    Past Surgical History  Procedure Date  . Coronary angioplasty with stent placement     RCA stent  . Vesicovaginal fistula closure w/ tah   . Left hip replacement     s/p 2002. Secondary to DJD  . Ectopic  pregnancy surgery     reports that she has quit smoking. She does not have any smokeless tobacco history on file. She reports that she does not drink alcohol or use illicit drugs. family history includes Breast cancer in her sister; Hypertension in her father; and Stroke in her father. Allergies  Allergen Reactions  . Sulfonamide Derivatives    Current Outpatient Prescriptions on File Prior to Visit  Medication Sig Dispense Refill  . aspirin (ECOTRIN LOW STRENGTH) 81 MG EC tablet Take 81 mg by mouth daily.        . diclofenac sodium (VOLTAREN) 1 % GEL Apply 1 application topically 2 (two) times daily as needed.        . metoprolol (TOPROL-XL) 50 MG 24 hr tablet Take 50 mg by mouth daily.        . Naproxen Sodium (ALEVE) 220 MG CAPS as needed.        . ramipril (ALTACE) 10 MG capsule Take 10 mg by mouth 2 (two) times daily.        . ranitidine (ZANTAC) 150 MG tablet Take 150 mg by mouth as needed.        . simvastatin (ZOCOR) 40 MG tablet TAKE 1 TABLET BY MOUTH EVERY DAY  30 tablet  11  . triamterene-hydrochlorothiazide (MAXZIDE-25) 37.5-25 MG per tablet TAKE 1 TABLET BY MOUTH ONCE A DAY  30 tablet  4   Review of Systems Review of Systems  Constitutional: Negative for diaphoresis and  unexpected weight change.  HENT: Negative for drooling and tinnitus.   Eyes: Negative for photophobia and visual disturbance.  Respiratory: Negative for choking and stridor.   Gastrointestinal: Negative for vomiting and blood in stool.  Genitourinary: Negative for hematuria and decreased urine volume.     Objective:   Physical Exam BP 116/62  Pulse 70  Temp(Src) 97.8 F (36.6 C) (Oral)  Wt 231 lb (104.781 kg)  SpO2 97% Physical Exam  VS noted Constitutional: Pt appears well-developed and well-nourished.  HENT: Head: Normocephalic.  Right Ear: External ear normal.  Left Ear: External ear normal.  Eyes: Conjunctivae and EOM are normal. Pupils are equal, round, and reactive to light.  Neck: Normal  range of motion. Neck supple.  Cardiovascular: Normal rate and regular rhythm.   Pulmonary/Chest: Effort normal and breath sounds normal.  Neurological: Pt is alert. No cranial nerve deficit.  Skin: Skin is warm. No erythema.  Right knee without effusion but severe deg changes adn decreased ROM Psychiatric: Pt behavior is normal. Thought content normal. not depressed affect    Assessment & Plan:

## 2011-01-06 NOTE — Patient Instructions (Signed)
Take all new medications as prescribed Continue all other medications as before You will be contacted regarding the referral for: orthopedic Please return in 6 months, or sooner if needed

## 2011-01-07 ENCOUNTER — Encounter: Payer: Self-pay | Admitting: Internal Medicine

## 2011-01-07 NOTE — Assessment & Plan Note (Signed)
stable overall by hx and exam, most recent data reviewed with pt, and pt to continue medical treatment as before  BP Readings from Last 3 Encounters:  01/06/11 116/62  09/20/10 133/62  04/21/10 160/64

## 2011-01-07 NOTE — Assessment & Plan Note (Signed)
stable overall by hx and exam, most recent data reviewed with pt, and pt to continue medical treatment as before  Lab Results  Component Value Date   WBC 11.1* 04/21/2010   HGB 10.8* 04/21/2010   HCT 32.7* 04/21/2010   PLT 274.0 04/21/2010   GLUCOSE 96 04/21/2010   CHOL 152 04/21/2010   TRIG 189.0* 04/21/2010   HDL 37.70* 04/21/2010   LDLCALC 77 04/21/2010   ALT 10 04/21/2010   AST 13 04/21/2010   NA 142 04/21/2010   K 5.3* 04/21/2010   CL 110 04/21/2010   CREATININE 1.5* 04/21/2010   BUN 35* 04/21/2010   CO2 25 04/21/2010   TSH 0.95 04/21/2010

## 2011-01-07 NOTE — Assessment & Plan Note (Signed)
Mod to severe, for pain control, and refer ortho - may need cortisone

## 2011-01-18 ENCOUNTER — Telehealth: Payer: Self-pay

## 2011-01-18 NOTE — Telephone Encounter (Signed)
Patient called to inform JWJ that she did see the ortho MD.

## 2011-01-18 NOTE — Telephone Encounter (Signed)
noted 

## 2011-01-29 ENCOUNTER — Emergency Department (HOSPITAL_COMMUNITY): Payer: Medicare Other

## 2011-01-29 ENCOUNTER — Other Ambulatory Visit: Payer: Self-pay

## 2011-01-29 ENCOUNTER — Observation Stay (HOSPITAL_COMMUNITY)
Admission: EM | Admit: 2011-01-29 | Discharge: 2011-02-02 | Disposition: A | Discharge status: Home / Self Care | Payer: Medicare Other | Attending: Internal Medicine | Admitting: Internal Medicine

## 2011-01-29 ENCOUNTER — Encounter (HOSPITAL_COMMUNITY): Payer: Self-pay | Admitting: *Deleted

## 2011-01-29 DIAGNOSIS — R5381 Other malaise: Secondary | ICD-10-CM | POA: Insufficient documentation

## 2011-01-29 DIAGNOSIS — N182 Chronic kidney disease, stage 2 (mild): Secondary | ICD-10-CM | POA: Diagnosis present

## 2011-01-29 DIAGNOSIS — E785 Hyperlipidemia, unspecified: Secondary | ICD-10-CM | POA: Insufficient documentation

## 2011-01-29 DIAGNOSIS — K219 Gastro-esophageal reflux disease without esophagitis: Secondary | ICD-10-CM | POA: Insufficient documentation

## 2011-01-29 DIAGNOSIS — M109 Gout, unspecified: Principal | ICD-10-CM | POA: Insufficient documentation

## 2011-01-29 DIAGNOSIS — F3289 Other specified depressive episodes: Secondary | ICD-10-CM | POA: Insufficient documentation

## 2011-01-29 DIAGNOSIS — I10 Essential (primary) hypertension: Secondary | ICD-10-CM

## 2011-01-29 DIAGNOSIS — F329 Major depressive disorder, single episode, unspecified: Secondary | ICD-10-CM | POA: Insufficient documentation

## 2011-01-29 DIAGNOSIS — I2510 Atherosclerotic heart disease of native coronary artery without angina pectoris: Secondary | ICD-10-CM | POA: Insufficient documentation

## 2011-01-29 DIAGNOSIS — I5022 Chronic systolic (congestive) heart failure: Secondary | ICD-10-CM | POA: Diagnosis present

## 2011-01-29 DIAGNOSIS — M1990 Unspecified osteoarthritis, unspecified site: Secondary | ICD-10-CM | POA: Insufficient documentation

## 2011-01-29 DIAGNOSIS — Z79899 Other long term (current) drug therapy: Secondary | ICD-10-CM | POA: Insufficient documentation

## 2011-01-29 DIAGNOSIS — M79609 Pain in unspecified limb: Secondary | ICD-10-CM | POA: Insufficient documentation

## 2011-01-29 DIAGNOSIS — N183 Chronic kidney disease, stage 3 unspecified: Secondary | ICD-10-CM | POA: Insufficient documentation

## 2011-01-29 DIAGNOSIS — Z96649 Presence of unspecified artificial hip joint: Secondary | ICD-10-CM | POA: Insufficient documentation

## 2011-01-29 DIAGNOSIS — I509 Heart failure, unspecified: Secondary | ICD-10-CM | POA: Insufficient documentation

## 2011-01-29 DIAGNOSIS — I252 Old myocardial infarction: Secondary | ICD-10-CM | POA: Insufficient documentation

## 2011-01-29 DIAGNOSIS — M79673 Pain in unspecified foot: Secondary | ICD-10-CM

## 2011-01-29 DIAGNOSIS — I129 Hypertensive chronic kidney disease with stage 1 through stage 4 chronic kidney disease, or unspecified chronic kidney disease: Secondary | ICD-10-CM | POA: Insufficient documentation

## 2011-01-29 LAB — CBC
HCT: 33.4 % — ABNORMAL LOW (ref 36.0–46.0)
Hemoglobin: 10.9 g/dL — ABNORMAL LOW (ref 12.0–15.0)
MCH: 30.6 pg (ref 26.0–34.0)
MCHC: 32.6 g/dL (ref 30.0–36.0)
MCV: 93.8 fL (ref 78.0–100.0)
Platelets: 279 10*3/uL (ref 150–400)
RBC: 3.56 MIL/uL — ABNORMAL LOW (ref 3.87–5.11)
RDW: 14.7 % (ref 11.5–15.5)
WBC: 14 10*3/uL — ABNORMAL HIGH (ref 4.0–10.5)

## 2011-01-29 LAB — BASIC METABOLIC PANEL
BUN: 30 mg/dL — ABNORMAL HIGH (ref 6–23)
CO2: 25 mEq/L (ref 19–32)
Calcium: 9.2 mg/dL (ref 8.4–10.5)
Chloride: 103 mEq/L (ref 96–112)
Creatinine, Ser: 1.49 mg/dL — ABNORMAL HIGH (ref 0.50–1.10)
GFR calc Af Amer: 35 mL/min — ABNORMAL LOW (ref >90)
GFR calc non Af Amer: 30 mL/min — ABNORMAL LOW (ref >90)
Glucose, Bld: 118 mg/dL — ABNORMAL HIGH (ref 70–99)
Potassium: 4.1 mEq/L (ref 3.5–5.1)
Sodium: 137 mEq/L (ref 135–145)

## 2011-01-29 LAB — SEDIMENTATION RATE: Sed Rate: 69 mm/hr — ABNORMAL HIGH (ref 0–22)

## 2011-01-29 LAB — C-REACTIVE PROTEIN: CRP: 11.94 mg/dL — ABNORMAL HIGH (ref <0.60)

## 2011-01-29 LAB — URIC ACID: Uric Acid, Serum: 9.3 mg/dL — ABNORMAL HIGH (ref 2.4–7.0)

## 2011-01-29 MED ORDER — METOPROLOL SUCCINATE ER 50 MG PO TB24
50.0000 mg | ORAL_TABLET | Freq: Every day | ORAL | Status: DC
  Administered 2011-01-29 – 2011-02-02 (×4): 50 mg via ORAL
  Filled 2011-01-29 (×5): qty 1

## 2011-01-29 MED ORDER — ONDANSETRON 4 MG PO TBDP
4.0000 mg | ORAL_TABLET | Freq: Once | ORAL | Status: AC
  Administered 2011-01-29: 4 mg via ORAL
  Filled 2011-01-29: qty 1

## 2011-01-29 MED ORDER — MORPHINE SULFATE 2 MG/ML IJ SOLN: 2.0000 mg | INTRAMUSCULAR | Status: DC | PRN

## 2011-01-29 MED ORDER — ALUM & MAG HYDROXIDE-SIMETH 200-200-20 MG/5ML PO SUSP: 30.0000 mL | Freq: Four times a day (QID) | ORAL | Status: DC | PRN

## 2011-01-29 MED ORDER — SODIUM CHLORIDE 0.9 % IJ SOLN: 3.0000 mL | INTRAMUSCULAR | Status: DC | PRN

## 2011-01-29 MED ORDER — ASPIRIN EC 81 MG PO TBEC
81.0000 mg | DELAYED_RELEASE_TABLET | Freq: Every day | ORAL | Status: DC
  Administered 2011-01-29 – 2011-02-02 (×5): 81 mg via ORAL
  Filled 2011-01-29 (×5): qty 1

## 2011-01-29 MED ORDER — COLCHICINE 0.6 MG PO TABS
0.6000 mg | ORAL_TABLET | Freq: Once | ORAL | Status: AC
  Administered 2011-01-29: 0.6 mg via ORAL
  Filled 2011-01-29: qty 1

## 2011-01-29 MED ORDER — RAMIPRIL 10 MG PO CAPS
10.0000 mg | ORAL_CAPSULE | Freq: Two times a day (BID) | ORAL | Status: DC
  Administered 2011-01-29 – 2011-02-02 (×8): 10 mg via ORAL
  Filled 2011-01-29 (×9): qty 1

## 2011-01-29 MED ORDER — FAMOTIDINE 20 MG PO TABS
20.0000 mg | ORAL_TABLET | Freq: Every day | ORAL | Status: DC
  Administered 2011-01-29 – 2011-02-02 (×5): 20 mg via ORAL
  Filled 2011-01-29 (×5): qty 1

## 2011-01-29 MED ORDER — COLCHICINE 0.6 MG PO TABS
1.2000 mg | ORAL_TABLET | ORAL | Status: AC
  Administered 2011-01-29: 1.2 mg via ORAL
  Filled 2011-01-29: qty 2

## 2011-01-29 MED ORDER — ONDANSETRON HCL 4 MG/2ML IJ SOLN: 4.0000 mg | Freq: Four times a day (QID) | INTRAMUSCULAR | Status: DC | PRN

## 2011-01-29 MED ORDER — SIMVASTATIN 40 MG PO TABS
40.0000 mg | ORAL_TABLET | Freq: Every day | ORAL | Status: DC
  Administered 2011-01-30 – 2011-02-01 (×3): 40 mg via ORAL
  Filled 2011-01-29 (×4): qty 1

## 2011-01-29 MED ORDER — ENOXAPARIN SODIUM 30 MG/0.3ML ~~LOC~~ SOLN
30.0000 mg | SUBCUTANEOUS | Status: DC
  Administered 2011-01-29 – 2011-02-01 (×4): 30 mg via SUBCUTANEOUS
  Filled 2011-01-29 (×5): qty 0.3

## 2011-01-29 MED ORDER — ONDANSETRON HCL 4 MG PO TABS: 4.0000 mg | ORAL_TABLET | Freq: Four times a day (QID) | ORAL | Status: DC | PRN

## 2011-01-29 MED ORDER — ACETAMINOPHEN 325 MG PO TABS: 650.0000 mg | ORAL_TABLET | Freq: Four times a day (QID) | ORAL | Status: DC | PRN

## 2011-01-29 MED ORDER — ACETAMINOPHEN 650 MG RE SUPP: 650.0000 mg | Freq: Four times a day (QID) | RECTAL | Status: DC | PRN

## 2011-01-29 MED ORDER — POLYETHYLENE GLYCOL 3350 17 G PO PACK: 17.0000 g | PACK | Freq: Every day | ORAL | Status: DC | PRN

## 2011-01-29 MED ORDER — PREDNISONE 20 MG PO TABS
40.0000 mg | ORAL_TABLET | Freq: Once | ORAL | Status: AC
  Administered 2011-01-29: 40 mg via ORAL
  Filled 2011-01-29: qty 2

## 2011-01-29 MED ORDER — SODIUM CHLORIDE 0.9 % IJ SOLN: 3.0000 mL | Freq: Two times a day (BID) | INTRAMUSCULAR | Status: DC

## 2011-01-29 MED ORDER — BRIMONIDINE TARTRATE 0.1 % OP SOLN: 1.0000 [drp] | Freq: Two times a day (BID) | OPHTHALMIC | Status: DC

## 2011-01-29 MED ORDER — BRIMONIDINE TARTRATE 0.15 % OP SOLN
1.0000 [drp] | Freq: Two times a day (BID) | OPHTHALMIC | Status: DC
  Administered 2011-01-29 – 2011-02-02 (×8): 1 [drp] via OPHTHALMIC
  Filled 2011-01-29: qty 5

## 2011-01-29 MED ORDER — OXYCODONE HCL 5 MG PO TABS: 5.0000 mg | ORAL_TABLET | ORAL | Status: DC | PRN

## 2011-01-29 MED ORDER — PREDNISONE 20 MG PO TABS
40.0000 mg | ORAL_TABLET | Freq: Every day | ORAL | Status: DC
  Administered 2011-01-30 – 2011-02-02 (×4): 40 mg via ORAL
  Filled 2011-01-29 (×4): qty 2

## 2011-01-29 MED ORDER — PREDNISONE 20 MG PO TABS: 40.0000 mg | ORAL_TABLET | Freq: Every day | ORAL | Status: DC

## 2011-01-29 MED ORDER — OXYCODONE-ACETAMINOPHEN 5-325 MG PO TABS: 1.0000 | ORAL_TABLET | ORAL | Status: DC | PRN

## 2011-01-29 MED ORDER — SODIUM CHLORIDE 0.9 % IV SOLN: 250.0000 mL | INTRAVENOUS | Status: DC | PRN

## 2011-01-29 MED ORDER — OXYCODONE-ACETAMINOPHEN 5-325 MG PO TABS
2.0000 | ORAL_TABLET | Freq: Once | ORAL | Status: AC
  Administered 2011-01-29: 2 via ORAL
  Filled 2011-01-29: qty 2

## 2011-01-29 MED ORDER — TRAMADOL HCL 50 MG PO TABS: 50.0000 mg | ORAL_TABLET | Freq: Four times a day (QID) | ORAL | Status: DC | PRN

## 2011-01-29 NOTE — ED Notes (Signed)
Per EMS, pt from home with c/o worsening R ankle/foot pain over the last week, now pt unable to put weight on foot for the past 2 days.

## 2011-01-29 NOTE — H&P (Signed)
PCP:   Oliver Barre, MD, MD   Chief Complaint:  Pain in right foot  HPI: Patient is an 77 year old white female with past medical history of diastolic heart failure, morbid obesity and CAD who for the past one to 2 weeks has been complaining of some pain in her hip and knee. She was evaluated by her PCP to was concerned about the possibility of worsening DJD and was looking into steroid injections versus orthopedic management. Over the last few days patient started having some increased pain around the area at the base of her right great toe and today the pain became so severe she was unable to walk at all. She came to emergency room for further evaluation  In the emergency room, she was noted to have a CRP of 11.9, white count of 14 and a sed rate of 60. Her uric acid level was elevated at 9.3. This is all consistent with acute gouty attack. Patient was given prednisone, but still unable to walk. She was at home herself with family who live nearby but they're unable to care for her and because of her difficulty ambulation and concerns for falls it was felt best that she come in for further treatment.  Review of Systems:  When I saw the patient, she complained of pain in her right foot. She denied headaches, vision changes, dysphasia, chest pain, palpitations or shortness of breath, wheeze, cough, abdominal pain, hematuria, dysuria, constipation, diarrhea, focal steady numbness weakness or pain other than in the right foot and also on on the right side of her hip. Review of systems otherwise negative.  Past Medical History: Past Medical History  Diagnosis Date  . Acute myocardial infarction, unspecified site, episode of care unspecified   . Congestive heart failure, unspecified   . Coronary atherosclerosis of unspecified type of vessel, native or graft   . Unspecified essential hypertension   . Other and unspecified hyperlipidemia   . Esophageal reflux   . Depressive disorder, not elsewhere  classified   . Morbid obesity   . Need for prophylactic vaccination and inoculation against influenza   . Allergic rhinitis, cause unspecified   . Other malaise and fatigue   . Disorder of bone and cartilage, unspecified   . Diverticulosis of colon (without mention of hemorrhage)   . Benign neoplasm of colon   . Gout, unspecified   . Unspecified glaucoma   . Osteoarthrosis, unspecified whether generalized or localized, unspecified site    Past Surgical History  Procedure Date  . Coronary angioplasty with stent placement     RCA stent  . Vesicovaginal fistula closure w/ tah   . Left hip replacement     s/p 2002. Secondary to DJD  . Ectopic pregnancy surgery     Medications: Prior to Admission medications   Medication Sig Start Date End Date Taking? Authorizing Provider  aspirin (ECOTRIN LOW STRENGTH) 81 MG EC tablet Take 81 mg by mouth daily.     Yes Historical Provider, MD  brimonidine (ALPHAGAN P) 0.1 % SOLN Place 1 drop into both eyes 2 (two) times daily.     Yes Historical Provider, MD  metoprolol (TOPROL-XL) 50 MG 24 hr tablet Take 50 mg by mouth daily.     Yes Historical Provider, MD  ramipril (ALTACE) 10 MG capsule Take 10 mg by mouth 2 (two) times daily.     Yes Historical Provider, MD  ranitidine (ZANTAC) 150 MG tablet Take 150 mg by mouth as needed. Acid reflux  Yes Historical Provider, MD  simvastatin (ZOCOR) 40 MG tablet TAKE 1 TABLET BY MOUTH EVERY DAY 09/18/10  Yes Valera Castle, MD  traMADol (ULTRAM) 50 MG tablet Take 50 mg by mouth every 6 (six) hours as needed. Pain.... Maximum dose= 8 tablets per day    Yes Historical Provider, MD  triamterene-hydrochlorothiazide (MAXZIDE-25) 37.5-25 MG per tablet TAKE 1 TABLET BY MOUTH ONCE A DAY 10/31/10  Yes Valera Castle, MD  oxyCODONE-acetaminophen (PERCOCET) 5-325 MG per tablet Take 1 tablet by mouth every 4 (four) hours as needed for pain. 01/29/11 02/08/11  Raeford Razor, MD  predniSONE (DELTASONE) 20 MG tablet Take 2 tablets (40  mg total) by mouth daily. 01/29/11   Raeford Razor, MD    Allergies:   Allergies  Allergen Reactions  . Sulfonamide Derivatives Other (See Comments)    unknown    Social History:  reports that she quit smoking about 87 years ago. She has never used smokeless tobacco. She reports that she does not drink alcohol or use illicit drugs. patient was at home by herself. Her normal baseline is that she is able to participate in activities of daily living without assistance  Family History: Family History  Problem Relation Age of Onset  . Breast cancer Sister   . Hypertension Father   . Stroke Father     Physical Exam: Filed Vitals:   01/29/11 1534 01/29/11 2005  BP: 173/62 128/56  Pulse: 73 78  Temp: 98.3 F (36.8 C) 98.1 F (36.7 C)  TempSrc: Oral Oral  Resp: 20 18  SpO2: 95% 94%   General: Alert and oriented x3, no apparent distress, looks stated age HEENT: Normocephalic, atraumatic, mucous members are moist Cardiovascular: Regular rate and rhythm, S1-S2, soft 2/6 systolic ejection murmur Lungs: Decreased breath sounds throughout and her to body habitus Abdomen: Soft, nontender, obese, positive bowel sounds Extremities: Trace pitting edema from the knees down bilaterally she has an area of redness and swelling and inflammation of the base of her right toe is extremely tender to touch.   Labs on Admission:   [facility] 01/29/11 1645  NA 137  K 4.1  CL 103  CO2 25  GLUCOSE 118*  BUN 30*  CREATININE 1.49*  CALCIUM 9.2  MG --  PHOS --    Basename 01/29/11 1645  WBC 14.0*  NEUTROABS --  HGB 10.9*  HCT 33.4*  MCV 93.8  PLT 279   Radiological Exams on Admission: Dg Chest 2 View  01/29/2011  IMPRESSION: Cardiomegaly.  Central pulmonary vascular prominence without frank pulmonary edema.  Band-like atelectasis lingula.  Elevated anterior right hemidiaphragm.  Calcified mildly tortuous aorta.  Shoulder joint degenerative changes.  Carotid bifurcation  calcifications.  Assessment/Plan Present on Admission:  .HYPERLIPIDEMIA: Stable. Continue medication.  Marland KitchenGOUT: We'll treat with some acute colchicine at 1.2 mg followed by 0.6 mg one hour later and no further doses. Patient has received 40 of prednisone in the emergency room today. We'll give another dose of 40 mg tomorrow. And then can start tapering doses on the next days after. We'll get PT to evaluate. Likely will need home health PT.  .OBESITY, MORBID  .HYPERTENSION: Stable, continue antihypertensive medications.  .CORONARY ARTERY DISEASE: Stable.  .CONGESTIVE HEART FAILURE: Looks to be stable no acute exacerbation.  Marland KitchenGERD: Continue PPI.  Marland KitchenDEPRESSION: Stable.  .CKD (chronic kidney disease) stage 2, GFR 60-89 ml/min: Looks to be stable and at baseline  I have discussed with patient and if confirmed her wishes to be a DO NOT RESUSCITATE.  Anticipate discharge likely tomorrow. Allura Doepke K 01/29/2011, 9:03 PM

## 2011-01-29 NOTE — ED Provider Notes (Signed)
History    89yF with R foot pain. Gradual worsening over past couple days. Denies trauma. No fever or chills. Has been having pain in r hip/knee recently and about 2w ago had steroid injection in R knee. Denies hx of gout. No acute numbness, weakness or tingling.  CSN: [id number] Arrival date & time: 01/29/2011  3:34 PM   First MD Initiated Contact with Patient 01/29/11 1604      Chief Complaint  Patient presents with  . Leg Swelling    (Consider location/radiation/quality/duration/timing/severity/associated sxs/prior treatment) HPI  Past Medical History  Diagnosis Date  . Acute myocardial infarction, unspecified site, episode of care unspecified   . Congestive heart failure, unspecified   . Coronary atherosclerosis of unspecified type of vessel, native or graft   . Unspecified essential hypertension   . Other and unspecified hyperlipidemia   . Esophageal reflux   . Depressive disorder, not elsewhere classified   . Morbid obesity   . Need for prophylactic vaccination and inoculation against influenza   . Allergic rhinitis, cause unspecified   . Other malaise and fatigue   . Disorder of bone and cartilage, unspecified   . Diverticulosis of colon (without mention of hemorrhage)   . Benign neoplasm of colon   . Gout, unspecified   . Unspecified glaucoma   . Osteoarthrosis, unspecified whether generalized or localized, unspecified site     Past Surgical History  Procedure Date  . Coronary angioplasty with stent placement     RCA stent  . Vesicovaginal fistula closure w/ tah   . Left hip replacement     s/p 2002. Secondary to DJD  . Ectopic pregnancy surgery     Family History  Problem Relation Age of Onset  . Breast cancer Sister   . Hypertension Father   . Stroke Father     History  Substance Use Topics  . Smoking status: Former Smoker    Quit date: 01/29/1924  . Smokeless tobacco: Never Used  . Alcohol Use: No    OB History    Grav Para Term Preterm  Abortions TAB SAB Ect Mult Living                  Review of Systems   Review of symptoms negative unless otherwise noted in HPI.   Allergies  Sulfonamide derivatives  Home Medications   Current Outpatient Rx  Name Route Sig Dispense Refill  . ASPIRIN 81 MG PO TBEC Oral Take 81 mg by mouth daily.      Marland Kitchen BRIMONIDINE TARTRATE 0.1 % OP SOLN Both Eyes Place 1 drop into both eyes 2 (two) times daily.      Marland Kitchen METOPROLOL SUCCINATE ER 50 MG PO TB24 Oral Take 50 mg by mouth daily.      Marland Kitchen RAMIPRIL 10 MG PO CAPS Oral Take 10 mg by mouth 2 (two) times daily.      Marland Kitchen RANITIDINE HCL 150 MG PO TABS Oral Take 150 mg by mouth as needed. Acid reflux    . SIMVASTATIN 40 MG PO TABS  TAKE 1 TABLET BY MOUTH EVERY DAY 30 tablet 11  . TRAMADOL HCL 50 MG PO TABS Oral Take 50 mg by mouth every 6 (six) hours as needed. Pain.... Maximum dose= 8 tablets per day     . TRIAMTERENE-HCTZ 37.5-25 MG PO TABS  TAKE 1 TABLET BY MOUTH ONCE A DAY 30 tablet 4  . OXYCODONE-ACETAMINOPHEN 5-325 MG PO TABS Oral Take 1 tablet by mouth every 4 (four) hours  as needed for pain. 12 tablet 0  . PREDNISONE 20 MG PO TABS Oral Take 2 tablets (40 mg total) by mouth daily. 12 tablet 0    BP 173/62  Pulse 73  Temp(Src) 98.3 F (36.8 C) (Oral)  Resp 20  SpO2 95%  Physical Exam  Nursing note and vitals reviewed. Constitutional: No distress.       Laying in bed. NAD. Obese.  HENT:  Head: Normocephalic and atraumatic.  Eyes: Conjunctivae are normal. Right eye exhibits no discharge. Left eye exhibits no discharge.  Neck: Neck supple.  Cardiovascular: Normal rate, regular rhythm and normal heart sounds.  Exam reveals no gallop and no friction rub.   No murmur heard. Pulmonary/Chest: Effort normal and breath sounds normal. No respiratory distress.  Abdominal: Soft. She exhibits no distension. There is no tenderness.  Musculoskeletal:       Severe tenderness dorsal apsect distal R foot. Severe pain with ROM of r great toe. Mild  overlying erythema and increased warmth.   Neurological: She is alert.  Skin: Skin is warm and dry.  Psychiatric: She has a normal mood and affect. Her behavior is normal. Thought content normal.    ED Course  Procedures (including critical care time)  Labs Reviewed  CBC - Abnormal; Notable for the following:    WBC 14.0 (*)    RBC 3.56 (*)    Hemoglobin 10.9 (*)    HCT 33.4 (*)    All other components within normal limits  SEDIMENTATION RATE - Abnormal; Notable for the following:    Sed Rate 69 (*)    All other components within normal limits  C-REACTIVE PROTEIN - Abnormal; Notable for the following:    CRP 11.94 (*)    All other components within normal limits  BASIC METABOLIC PANEL - Abnormal; Notable for the following:    Glucose, Bld 118 (*)    BUN 30 (*)    Creatinine, Ser 1.49 (*)    GFR calc non Af Amer 30 (*)    GFR calc Af Amer 35 (*)    All other components within normal limits  URIC ACID - Abnormal; Notable for the following:    Uric Acid, Serum 9.3 (*)    All other components within normal limits   Dg Chest 2 View  01/29/2011  *RADIOLOGY REPORT*  Clinical Data: Lower extremity swelling.  Nonsmoker.  CHEST - 2 VIEW  Comparison: None.  Findings: Cardiomegaly.  Poor inspiration with elevated right hemidiaphragm.  Lingula subsegmental atelectasis.  Central pulmonary vascular prominence.  No frank pulmonary edema.  No gross pneumothorax.  Prominent left shoulder joint degenerative changes.  Moderate right shoulder joint degenerative changes.  IMPRESSION: Cardiomegaly.  Central pulmonary vascular prominence without frank pulmonary edema.  Band-like atelectasis lingula.  Elevated anterior right hemidiaphragm.  Calcified mildly tortuous aorta.  Shoulder joint degenerative changes.  Carotid bifurcation calcifications.  Original Report Authenticated By: Fuller Canada, M.D.   EKG:  Rhythm: normal sinus with pac Rate: 78 Axis: borderline left Intervals: normal ST  segments: NS ST changes. Flattening laterally. qwave in III which noted on previous   1. Foot pain   2. Gout    8:20 PM Pt apparently lives alone and takes care of her ADLs.  Discussed trying to obtain assitance devices such as walker or wheelchair but family says don't think she could even transfer. Family at bedside but unwilling to assume care at this time and provide assistance at home. Discussed with hospitalist for admission. CRP  is markedly elevated giving support for infectious process and this could be further evaluated as inpt as well.   MDM  89yF with R foot pain. Suspect gout. Hx of same. Less likely septic joint, cellulitis, osteomyelitis. Does have a leukocytosis and elevated ESR but these are nonspeciifc tests. Pt with hx of gout and says feels similar. Uric acid level elevated. Doubt osteo with relatively acute course. No hx of trauma. Afebrile. Could be septic joint but less likely with pain in multiple joints. Wouldn't necessarily expect such severe pain with ROM of joints with cellulitis. Will tx as gout. Discussed strict return precautions.         Raeford Razor, MD 02/02/11 [phone]

## 2011-01-30 LAB — BASIC METABOLIC PANEL
CO2: 22 mEq/L (ref 19–32)
Calcium: 8.8 mg/dL (ref 8.4–10.5)
Creatinine, Ser: 1.67 mg/dL — ABNORMAL HIGH (ref 0.50–1.10)
GFR calc non Af Amer: 26 mL/min — ABNORMAL LOW (ref >90)
Glucose, Bld: 137 mg/dL — ABNORMAL HIGH (ref 70–99)
Sodium: 135 mEq/L (ref 135–145)

## 2011-01-30 LAB — CBC
MCH: 30.3 pg (ref 26.0–34.0)
MCHC: 32.1 g/dL (ref 30.0–36.0)
MCV: 94.4 fL (ref 78.0–100.0)
Platelets: 259 10*3/uL (ref 150–400)
RBC: 3.23 MIL/uL — ABNORMAL LOW (ref 3.87–5.11)
RDW: 14.8 % (ref 11.5–15.5)

## 2011-01-30 LAB — GLUCOSE, CAPILLARY: Glucose-Capillary: 155 mg/dL — ABNORMAL HIGH (ref 70–99)

## 2011-01-30 NOTE — Progress Notes (Signed)
Physical Therapy Evaluation Patient Details Name: Beth Gutierrez MRN: [id number] DOB: [birth year] Today's Date: 01/30/2011 10:30-11:00 EVII  Recommend continued PT at SNF at D/C Recommend nursing to use STEDY for transfers in [facility]  Problem List:  Patient Active Problem List  Diagnoses  . Benign neoplasm of colon  . HYPERLIPIDEMIA  . GOUT  . OBESITY, MORBID  . DEPRESSION  . GLAUCOMA  . HYPERTENSION  . AMI  . CORONARY ARTERY DISEASE  . CONGESTIVE HEART FAILURE  . ALLERGIC RHINITIS  . GERD  . DIVERTICULOSIS, COLON  . DEGENERATIVE JOINT DISEASE  . OSTEOPENIA  . FATIGUE  . FOOT PAIN, LEFT  . Preventative health care  . Pain in joint of right knee  . CKD (chronic kidney disease) stage 2, GFR 60-89 ml/min    Past Medical History:  Past Medical History  Diagnosis Date  . Acute myocardial infarction, unspecified site, episode of care unspecified   . Congestive heart failure, unspecified   . Coronary atherosclerosis of unspecified type of vessel, native or graft   . Unspecified essential hypertension   . Other and unspecified hyperlipidemia   . Esophageal reflux   . Depressive disorder, not elsewhere classified   . Morbid obesity   . Need for prophylactic vaccination and inoculation against influenza   . Allergic rhinitis, cause unspecified   . Other malaise and fatigue   . Disorder of bone and cartilage, unspecified   . Diverticulosis of colon (without mention of hemorrhage)   . Benign neoplasm of colon   . Gout, unspecified   . Unspecified glaucoma   . Osteoarthrosis, unspecified whether generalized or localized, unspecified site    Past Surgical History:  Past Surgical History  Procedure Date  . Coronary angioplasty with stent placement     RCA stent  . Vesicovaginal fistula closure w/ tah   . Left hip replacement     s/p 2002. Secondary to DJD  . Ectopic pregnancy surgery     PT Assessment/Plan/Recommendation PT Assessment Clinical Impression  Statement: Pt lives alone, function now impaired by pain and inability to walk.  She will need continued PT at SNF prior to return to home PT Recommendation/Assessment: Patient will need skilled PT in the acute care venue PT Problem List: Decreased activity tolerance;Decreased mobility;Obesity;Pain Barriers to Discharge: Decreased caregiver support PT Therapy Diagnosis : Difficulty walking;Generalized weakness;Acute pain PT Plan PT Frequency: Min 3X/week PT Treatment/Interventions: Gait training;Functional mobility training;Therapeutic activities;Therapeutic exercise;Patient/family education PT Recommendation Recommendations for Other Services: OT consult Follow Up Recommendations: Skilled nursing facility Equipment Recommended: Rolling walker with 5" wheels;Defer to next venue (wide) PT Goals  Acute Rehab PT Goals PT Goal Formulation: With patient Time For Goal Achievement: 2 weeks Pt will go Supine/Side to Sit: with min assist PT Goal: Supine/Side to Sit - Progress: Not met Pt will go Stand to Sit: with min assist PT Goal: Stand to Sit - Progress: Not met Pt will Transfer Bed to Chair/Chair to Bed: with min assist Pt will Perform Home Exercise Program: with min assist PT Goal: Perform Home Exercise Program - Progress: Not met  PT Evaluation Precautions/Restrictions    Prior Functioning  Home Living Lives With: Alone Receives Help From: Other (Comment);Family (household help) Type of Home: House Home Layout: One level Home Access: Stairs to enter Entrance Stairs-Rails:  (??son working on rails??) Entrance Stairs-Number of Steps: 2 Home Adaptive Equipment: Straight cane Additional Comments: standard walker from sister Prior Function Level of Independence: Requires assistive device for independence (recent problems with right  knee Dr. Eulah Pont recommended shots) Cognition Cognition Arousal/Alertness: Awake/alert Overall Cognitive Status: Appears within functional limits for  tasks assessed Sensation/Coordination Sensation Additional Comments: unable to tolerate touching of right foot.  Warmth and redness at hallux valgus Extremity Assessment RLE Assessment RLE Assessment: Exceptions to [facility] RLE AROM (degrees) RLE Overall AROM Comments: warm, red, extremely sensitive hallux valgus with decreased active motion. AROM at hip and knee WFL LLE Assessment LLE Assessment: Within Functional Limits Mobility (including Balance) Bed Mobility Bed Mobility: Yes Rolling Left: 4: Min assist Supine to Sit: 3: Mod assist Supine to Sit Details (indicate cue type and reason): pt limited by abdominal obesity Transfers Transfers: Yes Sit to Stand: 3: Mod assist Sit to Stand Details (indicate cue type and reason): limited by pain and inabliltiy to bear weight on right foot, ultimately used STEDY to get from bed to chair Stand to Sit: 3: Mod assist Stand to Sit Details: needs assist to control descent Ambulation/Gait Ambulation/Gait:  (unable to bear weight, shift weight or ambulate)  Posture/Postural Control Posture/Postural Control: Postural limitations Postural Limitations: pt with obesity, general deconditioning,  Exercise  Other Exercises Other Exercises: posture exercises, trunk extension and gluteal sets End of Session PT - End of Session Equipment Utilized During Treatment:  (STEDY ) Activity Tolerance: Patient limited by pain Patient left: with call bell in reach;in chair Nurse Communication: Mobility status for transfers;Need for lift equipment General Behavior During Session: [facility] for tasks performed Cognition: [facility] for tasks performed  Donnetta Hail 01/30/2011, 11:23 AM

## 2011-01-30 NOTE — Progress Notes (Signed)
UR completed 

## 2011-01-30 NOTE — Progress Notes (Signed)
Subjective: Pt toe pain is 6/10.   Objective: Weight change:   Intake/Output Summary (Last 24 hours) at 01/30/11 1048 Last data filed at 01/30/11 0900  Gross per 24 hour  Intake    240 ml  Output    400 ml  Net   -160 ml  on exam she is alert afebrile and in mild distress from the toe pain.  CVS s1 s2 heard Lungs clear Abdomen benign Extremtiies: right toe tenderness, erythema persistent   Lab Results: Results for orders placed during the hospital encounter of 01/29/11 (from the past 24 hour(s))  CBC     Status: Abnormal   Collection Time   01/29/11  4:45 PM      Component Value Range   WBC 14.0 (*) 4.0 - 10.5 (K/uL)   RBC 3.56 (*) 3.87 - 5.11 (MIL/uL)   Hemoglobin 10.9 (*) 12.0 - 15.0 (g/dL)   HCT 08.6 (*) 57.8 - 46.0 (%)   MCV 93.8  78.0 - 100.0 (fL)   MCH 30.6  26.0 - 34.0 (pg)   MCHC 32.6  30.0 - 36.0 (g/dL)   RDW 46.9  62.9 - 52.8 (%)   Platelets 279  150 - 400 (K/uL)  SEDIMENTATION RATE     Status: Abnormal   Collection Time   01/29/11  4:45 PM      Component Value Range   Sed Rate 69 (*) 0 - 22 (mm/hr)  C-REACTIVE PROTEIN     Status: Abnormal   Collection Time   01/29/11  4:45 PM      Component Value Range   CRP 11.94 (*) <0.60 (mg/dL)  BASIC METABOLIC PANEL     Status: Abnormal   Collection Time   01/29/11  4:45 PM      Component Value Range   Sodium 137  135 - 145 (mEq/L)   Potassium 4.1  3.5 - 5.1 (mEq/L)   Chloride 103  96 - 112 (mEq/L)   CO2 25  19 - 32 (mEq/L)   Glucose, Bld 118 (*) 70 - 99 (mg/dL)   BUN 30 (*) 6 - 23 (mg/dL)   Creatinine, Ser 4.13 (*) 0.50 - 1.10 (mg/dL)   Calcium 9.2  8.4 - 24.4 (mg/dL)   GFR calc non Af Amer 30 (*) >90 (mL/min)   GFR calc Af Amer 35 (*) >90 (mL/min)  URIC ACID     Status: Abnormal   Collection Time   01/29/11  4:45 PM      Component Value Range   Uric Acid, Serum 9.3 (*) 2.4 - 7.0 (mg/dL)  BASIC METABOLIC PANEL     Status: Abnormal   Collection Time   01/30/11  3:39 AM      Component Value Range   Sodium 135  135 - 145 (mEq/L)   Potassium 5.1  3.5 - 5.1 (mEq/L)   Chloride 104  96 - 112 (mEq/L)   CO2 22  19 - 32 (mEq/L)   Glucose, Bld 137 (*) 70 - 99 (mg/dL)   BUN 31 (*) 6 - 23 (mg/dL)   Creatinine, Ser 0.10 (*) 0.50 - 1.10 (mg/dL)   Calcium 8.8  8.4 - 27.2 (mg/dL)   GFR calc non Af Amer 26 (*) >90 (mL/min)   GFR calc Af Amer 31 (*) >90 (mL/min)  CBC     Status: Abnormal   Collection Time   01/30/11  3:39 AM      Component Value Range   WBC 14.8 (*) 4.0 - 10.5 (K/uL)  RBC 3.23 (*) 3.87 - 5.11 (MIL/uL)   Hemoglobin 9.8 (*) 12.0 - 15.0 (g/dL)   HCT 40.9 (*) 81.1 - 46.0 (%)   MCV 94.4  78.0 - 100.0 (fL)   MCH 30.3  26.0 - 34.0 (pg)   MCHC 32.1  30.0 - 36.0 (g/dL)   RDW 91.4  78.2 - 95.6 (%)   Platelets 259  150 - 400 (K/uL)  GLUCOSE, CAPILLARY     Status: Abnormal   Collection Time   01/30/11  7:59 AM      Component Value Range   Glucose-Capillary 155 (*) 70 - 99 (mg/dL)     Micro Results: No results found for this or any previous visit (from the past 240 hour(s)).  Studies/Results: Dg Chest 2 View  01/29/2011  *RADIOLOGY REPORT*  Clinical Data: Lower extremity swelling.  Nonsmoker.  CHEST - 2 VIEW  Comparison: None.  Findings: Cardiomegaly.  Poor inspiration with elevated right hemidiaphragm.  Lingula subsegmental atelectasis.  Central pulmonary vascular prominence.  No frank pulmonary edema.  No gross pneumothorax.  Prominent left shoulder joint degenerative changes.  Moderate right shoulder joint degenerative changes.  IMPRESSION: Cardiomegaly.  Central pulmonary vascular prominence without frank pulmonary edema.  Band-like atelectasis lingula.  Elevated anterior right hemidiaphragm.  Calcified mildly tortuous aorta.  Shoulder joint degenerative changes.  Carotid bifurcation calcifications.  Original Report Authenticated By: Fuller Canada, M.D.   Medications: Scheduled Meds:   . aspirin EC  81 mg Oral Daily  . brimonidine  1 drop Both Eyes BID  . colchicine  0.6  mg Oral Once  . colchicine  1.2 mg Oral NOW  . enoxaparin  30 mg Subcutaneous Q24H  . famotidine  20 mg Oral Daily  . metoprolol  50 mg Oral Daily  . ondansetron  4 mg Oral Once  . oxyCODONE-acetaminophen  2 tablet Oral Once  . predniSONE  40 mg Oral Once  . predniSONE  40 mg Oral Q breakfast  . ramipril  10 mg Oral BID  . simvastatin  40 mg Oral q1800  . sodium chloride  3 mL Intravenous Q12H  . DISCONTD: brimonidine  1 drop Both Eyes BID   Continuous Infusions:  PRN Meds:.sodium chloride, acetaminophen, acetaminophen, alum & mag hydroxide-simeth, morphine, ondansetron (ZOFRAN) IV, ondansetron, oxyCODONE, polyethylene glycol, sodium chloride, traMADol  Assessment/Plan: Patient Active Hospital Problem List: GOUT (10/04/2006) 2 doses of colchicine and on po prednisone. Awaiting pt evaluation.  Persistent leukocytosis HYPERLIPIDEMIA (10/04/2006) On simvastatin Normocytic anemia: hgb slightly down from the the baseline. Will continue to monitor.   HYPERTENSION (10/04/2006) Controlled.  CORONARY ARTERY DISEASE (10/04/2006) CONGESTIVE HEART FAILURE (07/03/2007) compensated GERD (07/03/2007) On protonix CKD (chronic kidney disease) stage 2, GFR 60-89 ml/min (01/29/2011) Worsened slightly. Will continue to monitor PLan: d/c home when pain is better controlled.    LOS: 1 day   Kritika Stukes 01/30/2011, 10:48 AM

## 2011-01-31 ENCOUNTER — Telehealth: Payer: Self-pay

## 2011-01-31 NOTE — Progress Notes (Signed)
UR completed 

## 2011-01-31 NOTE — Telephone Encounter (Signed)
Patients son came by the office to inform the patient is at [facility] going to be D/C soon. Patient is unable to walk and son believes may need rehab. Please advise, call back number for the son is [phone]

## 2011-01-31 NOTE — Progress Notes (Signed)
Subjective: Pt toe pain is better.  Pain is 5to 6/10. Pt;s family is at bedside.   Objective: Weight change:   Intake/Output Summary (Last 24 hours) at 01/31/11 1800 Last data filed at 01/31/11 1444  Gross per 24 hour  Intake    360 ml  Output    200 ml  Net    160 ml   Exam:  She is alert afebrile comfortably sitting in the chair.  cvs heart sounds normal Lungs clear Abdomen benign Xtremities: no pedal edema, right toe and foot slightly warm and erythema improved.  Lab Results: Results for orders placed during the hospital encounter of 01/29/11 (from the past 24 hour(s))  GLUCOSE, CAPILLARY     Status: Abnormal   Collection Time   01/31/11  7:24 AM      Component Value Range   Glucose-Capillary 119 (*) 70 - 99 (mg/dL)     Micro Results: No results found for this or any previous visit (from the past 240 hour(s)).  Studies/Results: Dg Chest 2 View  01/29/2011  *RADIOLOGY REPORT*  Clinical Data: Lower extremity swelling.  Nonsmoker.  CHEST - 2 VIEW  Comparison: None.  Findings: Cardiomegaly.  Poor inspiration with elevated right hemidiaphragm.  Lingula subsegmental atelectasis.  Central pulmonary vascular prominence.  No frank pulmonary edema.  No gross pneumothorax.  Prominent left shoulder joint degenerative changes.  Moderate right shoulder joint degenerative changes.  IMPRESSION: Cardiomegaly.  Central pulmonary vascular prominence without frank pulmonary edema.  Band-like atelectasis lingula.  Elevated anterior right hemidiaphragm.  Calcified mildly tortuous aorta.  Shoulder joint degenerative changes.  Carotid bifurcation calcifications.  Original Report Authenticated By: Fuller Canada, M.D.   Medications: Scheduled Meds:   . aspirin EC  81 mg Oral Daily  . brimonidine  1 drop Both Eyes BID  . enoxaparin  30 mg Subcutaneous Q24H  . famotidine  20 mg Oral Daily  . metoprolol  50 mg Oral Daily  . predniSONE  40 mg Oral Q breakfast  . ramipril  10 mg Oral BID  .  simvastatin  40 mg Oral q1800  . sodium chloride  3 mL Intravenous Q12H   Continuous Infusions:  PRN Meds:.sodium chloride, acetaminophen, acetaminophen, alum & mag hydroxide-simeth, morphine, ondansetron (ZOFRAN) IV, ondansetron, oxyCODONE, polyethylene glycol, sodium chloride, traMADol  Assessment/Plan: Patient Active Hospital Problem List: GOUTy flare: on a prednisone taper.  Physical Therapy ordered.   HYPERLIPIDEMIA (10/04/2006) On zocor. HYPERTENSION (10/04/2006)   controlled CORONARY ARTERY DISEASE (10/04/2006) GERD : on pepcid.  CKD (chronic kidney disease) stage 2, GFR 60-89 ml/min (01/29/2011) Stable.  LOS: 2 days   Aliana Kreischer 01/31/2011, 6:00 PM

## 2011-01-31 NOTE — Telephone Encounter (Signed)
This is supposed to be considered by the hospital MD; I should not try to treat now, as this is likely to be ordered if needed by the hospitalist;  Pt should discuss concernw with hospitalist while pt is in the hospital, or with social services if involved

## 2011-01-31 NOTE — Progress Notes (Signed)
Physical Therapy Treatment Patient Details Name: Beth Gutierrez MRN: [id number] DOB: 1921-06-10 Today's Date: 01/31/2011 1350-1415 2G  PT Assessment/Plan  PT - Assessment/Plan Comments on Treatment Session: pt with improved weight bearing and mobility today, but still would benefit from short term SNF PT and OT to optimize functional independence for return to home. PT Plan: Discharge plan remains appropriate PT Frequency: Min 3X/week Follow Up Recommendations: Skilled nursing facility Equipment Recommended: Rolling walker with 5" wheels;Defer to next venue PT Goals  Acute Rehab PT Goals PT Goal: Supine/Side to Sit - Progress: Not met PT Goal: Stand to Sit - Progress: Not met PT Transfer Goal: Bed to Chair/Chair to Bed - Progress: Not met PT Goal: Perform Home Exercise Program - Progress: Not met  PT Treatment Precautions/Restrictions  Restrictions Weight Bearing Restrictions: Yes (right foot) Mobility (including Balance) Bed Mobility Sit to Supine - Left: 4: Min assist Sit to Supine - Left Details (indicate cue type and reason): needs assist bringing feet up onto bed Transfers Sit to Stand: 3: Mod assist Sit to Stand Details (indicate cue type and reason): pt slow, needs consised cues to push up with arms and stand all the way erect.  Repeated sit to stand > 5 times for strengthening Stand to Sit: 4: Min assist Stand to Sit Details: cues to reach back with arms Ambulation/Gait Ambulation/Gait: Yes Ambulation/Gait Assistance: 3: Mod assist Ambulation/Gait Assistance Details (indicate cue type and reason): Pt able to bear some weight on right leg today, but needs assist to stand erect, weight shift and step correctly with right leg.   Pt appears with decreased responses, needs 24/7  assist for self care and safety. pt amb to bathroom to void Ambulation Distance (Feet): 20 Feet Assistive device: Rolling walker (wide) Gait Pattern: Step-to pattern;Decreased step length -  right;Decreased stance time - right;Antalgic (still limited weight bearing on right leg) Gait velocity: slow    Exercise  Other Exercises Other Exercises: sit to stand with posture exercises End of Session PT - End of Session Equipment Utilized During Treatment: Gait belt (RW) Activity Tolerance: Patient limited by fatigue (slowed responses, questionable confusion) Patient left: in bed;with call bell in reach;with bed alarm set Nurse Communication: Mobility status for transfers;Mobility status for ambulation General Behavior During Session: [facility] for tasks performed Cognition: [facility] for tasks performed  Donnetta Hail 01/31/2011, 4:03 PM

## 2011-01-31 NOTE — Progress Notes (Signed)
01-31-11 Spoke with patient and dtr at bedside per CSW request to explain OBV status. Pt has been observation since admission. There was not a change in status; Because SNF is recommended by therapy, dtr and patient needed further explanation of why pt does not meet requirements for medicare to pay for SNF stay. Explained that patient would have to be inpatient level of care in order to have qualifying stay at snf. Also explained that case was sent to second level review and it was determined observation as well. Gave dtr and patient list of home health agencies if hhc is chosen and left with CSW explaining what out of pocket expenses would entail.   [place], [place] [phone]

## 2011-02-01 NOTE — Progress Notes (Signed)
Son has spoken with Dr. Kerry Hough, plan for d/c home in am. Son has chosen HH with [facility], contacted Darl Pikes with [facility] to arrange. Will need orders prior to d/c.

## 2011-02-01 NOTE — Progress Notes (Signed)
Occupational Therapy Evaluation Patient Details Name: Beth Gutierrez MRN: [id number] DOB: 1921-08-03 Today's Date: 02/01/2011  Problem List:  Patient Active Problem List  Diagnoses  . Benign neoplasm of colon  . HYPERLIPIDEMIA  . GOUT  . OBESITY, MORBID  . DEPRESSION  . GLAUCOMA  . HYPERTENSION  . AMI  . CORONARY ARTERY DISEASE  . CONGESTIVE HEART FAILURE  . ALLERGIC RHINITIS  . GERD  . DIVERTICULOSIS, COLON  . DEGENERATIVE JOINT DISEASE  . OSTEOPENIA  . FATIGUE  . FOOT PAIN, LEFT  . Preventative health care  . Pain in joint of right knee  . CKD (chronic kidney disease) stage 2, GFR 60-89 ml/min    Past Medical History:  Past Medical History  Diagnosis Date  . Acute myocardial infarction, unspecified site, episode of care unspecified   . Congestive heart failure, unspecified   . Coronary atherosclerosis of unspecified type of vessel, native or graft   . Unspecified essential hypertension   . Other and unspecified hyperlipidemia   . Esophageal reflux   . Depressive disorder, not elsewhere classified   . Morbid obesity   . Need for prophylactic vaccination and inoculation against influenza   . Allergic rhinitis, cause unspecified   . Other malaise and fatigue   . Disorder of bone and cartilage, unspecified   . Diverticulosis of colon (without mention of hemorrhage)   . Benign neoplasm of colon   . Gout, unspecified   . Unspecified glaucoma   . Osteoarthrosis, unspecified whether generalized or localized, unspecified site    Past Surgical History:  Past Surgical History  Procedure Date  . Coronary angioplasty with stent placement     RCA stent  . Vesicovaginal fistula closure w/ tah   . Left hip replacement     s/p 2002. Secondary to DJD  . Ectopic pregnancy surgery     OT Assessment/Plan/Recommendation OT Assessment Clinical Impression Statement: Pt is an 77 year old woman with an apparent gout flare up affecting her R foot.  Pt reports her pain  is much better.  She was able to perform mobility with min guard assist and use of RW.  Recommend home with 24 hour assist at least initially.  If family is unable to provide, recommend SNF for ST rehab.  Pt wants to go home with HHPT and [facility] for safety.  Pt needs an elongated 3 in 1 for home. OT Recommendation/Assessment: Patient will need skilled OT in the acute care venue OT Problem List: Impaired balance (sitting and/or standing);Decreased activity tolerance;Pain;Obesity OT Therapy Diagnosis : Generalized weakness;Acute pain OT Plan OT Frequency: Min 2X/week OT Treatment/Interventions: Self-care/ADL training;Patient/family education;DME and/or AE instruction OT Recommendation Follow Up Recommendations: Skilled nursing facility;24 hour supervision/assistance;Home health OT;Other (comment) (Recommend SNF if family unable to provide 24 hr care) Equipment Recommended: 3 in 1 bedside comode (elongated 3 in 1 if pt goes home, otherwise defer to SNF) Individuals Consulted Consulted and Agree with Results and Recommendations: Patient OT Goals Acute Rehab OT Goals OT Goal Formulation: With patient Time For Goal Achievement: 2 weeks ADL Goals Pt Will Perform Grooming: with supervision;Other (comment);Standing at sink (3 activities) ADL Goal: Grooming - Progress: Not met Pt Will Perform Upper Body Bathing: with set-up;Sitting, edge of bed ADL Goal: Upper Body Bathing - Progress: Not met Pt Will Perform Lower Body Bathing: with min assist;with adaptive equipment;Sitting, edge of bed;Sit to stand from bed ADL Goal: Lower Body Bathing - Progress: Not met Pt Will Perform Upper Body Dressing: with supervision;Sitting, bed ADL  Goal: Upper Body Dressing - Progress: Not met Pt Will Perform Lower Body Dressing: with min assist;with adaptive equipment;Sitting, bed;Sit to stand from bed ADL Goal: Lower Body Dressing - Progress: Not met Pt Will Transfer to Toilet: with supervision;Extra wide  3-in-1;Other (comment) (over toilet) ADL Goal: Toilet Transfer - Progress: Not met Pt Will Perform Toileting - Clothing Manipulation: with supervision;Standing ADL Goal: Toileting - Clothing Manipulation - Progress: Not met Pt Will Perform Toileting - Hygiene: Independently;Sit to stand from 3-in-1/toilet ADL Goal: Toileting - Hygiene - Progress: Not met  OT Evaluation Precautions/Restrictions  Precautions Precautions: Fall Restrictions Weight Bearing Restrictions: Yes RLE Weight Bearing:  (LIMITED) Prior Functioning Home Living Lives With: Alone Receives Help From: Family;Other (Comment) (paid caregiver) Type of Home: House Home Layout: One level Home Access: Stairs to enter [employer] of Steps: 2 Bathroom Shower/Tub: Tub/shower unit;Curtain Bathroom Toilet: Handicapped height Home Adaptive Equipment: Straight cane;Tub transfer bench;Hand-held shower hose;Wheelchair - powered;Other (comment) (walker--unsure if it has wheels) Prior Function Level of Independence: Requires assistive device for independence Driving: No Comments: Pts caregiver takes her to the store, assists with paying bills, does some meal prep and does housekeeping. ADL ADL Eating/Feeding: Simulated;Independent Where Assessed - Eating/Feeding: Chair Grooming: Performed;Wash/dry hands;Supervision/safety Where Assessed - Grooming: Standing at sink Upper Body Bathing: Simulated;Minimal assistance;Other (comment) (for back) Where Assessed - Upper Body Bathing: Sitting, chair Lower Body Bathing: Simulated;Moderate assistance Lower Body Bathing Details (indicate cue type and reason): Pt typically washes LEs with bath brush in shower. Where Assessed - Lower Body Bathing: Sitting, chair;Sit to stand from chair Upper Body Dressing: Simulated;Set up Upper Body Dressing Details (indicate cue type and reason): Pt reports donning sweatshirt with set up. Where Assessed - Upper Body Dressing: Sitting,  chair Lower Body Dressing: Performed;Moderate assistance Lower Body Dressing Details (indicate cue type and reason): pt able to donn socks, assist for slippers, pt typically sits at EOB to donn and wears slip on shoes Where Assessed - Lower Body Dressing: Sitting, chair;Sit to stand from chair Toilet Transfer: Performed;Minimal assistance Toilet Transfer Method: Proofreader: Bedside commode Equipment Used: Rolling walker Vision/Perception  Vision - History Baseline Vision: Wears glasses all the time Cognition Cognition Arousal/Alertness: Awake/alert Overall Cognitive Status: Appears within functional limits for tasks assessed Cognition - Other Comments: Pt had 2 hospital gowns and sweatpants and shirt on.  Stated she was going home, although d/c has not been planned at this point.  Caregivers states pt has some periods of confusion at home, but generally is co Sensation/Coordination Sensation Light Touch: Appears Intact Hot/Cold: Appears Intact Proprioception: Appears Intact Coordination Fine Motor Movements are Fluid and Coordinated: Yes Extremity Assessment RUE Assessment RUE Assessment: Within Functional Limits LUE Assessment LUE Assessment: Within Functional Limits Mobility  Bed Mobility Bed Mobility: No Transfers Transfers: Yes Sit to Stand: 5: Supervision;From chair/3-in-1;Other (comment) (leaned on walker to stand) Stand to Sit: 5: Supervision;To chair/3-in-1 Exercises   End of Session OT - End of Session Equipment Utilized During Treatment: Gait belt Activity Tolerance: Patient tolerated treatment well Patient left: in chair;with family/visitor present;with call bell in reach General Behavior During Session: [facility] for tasks performed Cognition: [facility] for tasks performed   Evern Bio 02/01/2011, 1:01 PM

## 2011-02-01 NOTE — Telephone Encounter (Signed)
Since we dont have social services here, placement is very difficult and mostly handled by the family when pt is an outpt, and can take several months  Placement is much easier to do when pt is Inpt as the "priority" is the Inpt patients, and often takes only a few days

## 2011-02-01 NOTE — Progress Notes (Signed)
Spoke with patient this am at bedside about d/c plans. She wants to d/c home, she is agreeable to SNF but states she is unable to pay out of pocket for it. Asked about HH arrangements, she referred me to her son, Jonny Ruiz [phone]. Spoke with Jonny Ruiz on the phone this am, he wants her to go to SNF but states there are no funds to pay for this. He states he has spoken to patients PCP who states she needs SNF. Discussed d/c options again for home vs SNF. Son upset that SNF would be out of pocket expense and wanted to discuss with attending. Spoke with son again this afternoon, provided him with [facility] agency list. He states he will likely take patient home and try to arrange rehab through PCP and arrange for assist at home if needed. Will arrange [facility] services as needed. Patient and son agreeable to plan.

## 2011-02-01 NOTE — Telephone Encounter (Signed)
Pt son informed and would like to know if pt is discharged and brought in for OV would MD be able to assist with placement. Son is requesting some advisement form MD on other options in getting pt placed, please advise.

## 2011-02-01 NOTE — Progress Notes (Signed)
Occupational Therapy Evaluation Patient Details Name: Beth Gutierrez MRN: [id number] DOB: 1921-12-29 Today's Date: 02/01/2011  Problem List:  Patient Active Problem List  Diagnoses  . Benign neoplasm of colon  . HYPERLIPIDEMIA  . GOUT  . OBESITY, MORBID  . DEPRESSION  . GLAUCOMA  . HYPERTENSION  . AMI  . CORONARY ARTERY DISEASE  . CONGESTIVE HEART FAILURE  . ALLERGIC RHINITIS  . GERD  . DIVERTICULOSIS, COLON  . DEGENERATIVE JOINT DISEASE  . OSTEOPENIA  . FATIGUE  . FOOT PAIN, LEFT  . Preventative health care  . Pain in joint of right knee  . CKD (chronic kidney disease) stage 2, GFR 60-89 ml/min    Past Medical History:  Past Medical History  Diagnosis Date  . Acute myocardial infarction, unspecified site, episode of care unspecified   . Congestive heart failure, unspecified   . Coronary atherosclerosis of unspecified type of vessel, native or graft   . Unspecified essential hypertension   . Other and unspecified hyperlipidemia   . Esophageal reflux   . Depressive disorder, not elsewhere classified   . Morbid obesity   . Need for prophylactic vaccination and inoculation against influenza   . Allergic rhinitis, cause unspecified   . Other malaise and fatigue   . Disorder of bone and cartilage, unspecified   . Diverticulosis of colon (without mention of hemorrhage)   . Benign neoplasm of colon   . Gout, unspecified   . Unspecified glaucoma   . Osteoarthrosis, unspecified whether generalized or localized, unspecified site    Past Surgical History:  Past Surgical History  Procedure Date  . Coronary angioplasty with stent placement     RCA stent  . Vesicovaginal fistula closure w/ tah   . Left hip replacement     s/p 2002. Secondary to DJD  . Ectopic pregnancy surgery     OT Assessment/Plan/Recommendation OT Assessment Clinical Impression Statement: Pt is an 77 year old woman with an apparent gout flare up affecting her R foot.  Pt reports her pain  is much better.  She was able to perform mobility with min guard assist and use of RW.  Recommend home with 24 hour assist at least initially.  If family is unable to provide, recommend SNF for ST rehab.  Pt wants to go home with HHPT and [facility] for safety.  Pt needs and elongated 3 in 1 for home. OT Recommendation/Assessment: Patient will need skilled OT in the acute care venue OT Problem List: Impaired balance (sitting and/or standing);Decreased activity tolerance;Pain;Obesity OT Therapy Diagnosis : Generalized weakness;Acute pain OT Plan OT Frequency: Min 2X/week OT Treatment/Interventions: Self-care/ADL training;Patient/family education;DME and/or AE instruction OT Recommendation Follow Up Recommendations: Skilled nursing facility;24 hour supervision/assistance;Home health OT;Other (comment) (Recommend SNF if family unable to provide 24 hr care) Equipment Recommended: 3 in 1 bedside comode (elongated 3 in 1 if pt goes home, otherwise defer to SNF) Individuals Consulted Consulted and Agree with Results and Recommendations: Patient OT Goals Acute Rehab OT Goals OT Goal Formulation: With patient Time For Goal Achievement: 2 weeks ADL Goals Pt Will Perform Grooming: with supervision;Other (comment);Standing at sink (3 activities) ADL Goal: Grooming - Progress: Not met Pt Will Perform Upper Body Bathing: Sitting, edge of bed;with supervision ADL Goal: Upper Body Bathing - Progress: Not met Pt Will Perform Lower Body Bathing: with adaptive equipment;Sitting, edge of bed;Sit to stand from bed;with supervision ADL Goal: Lower Body Bathing - Progress: Not met Pt Will Perform Upper Body Dressing: with supervision;Sitting, bed ADL Goal:  Upper Body Dressing - Progress: Not met Pt Will Perform Lower Body Dressing: with adaptive equipment;Sitting, bed;Sit to stand from bed;with supervision ADL Goal: Lower Body Dressing - Progress: Not met Pt Will Transfer to Toilet: with supervision;Extra wide  3-in-1;Other (comment) (over toilet) ADL Goal: Toilet Transfer - Progress: Not met Pt Will Perform Toileting - Clothing Manipulation: with supervision;Standing ADL Goal: Toileting - Clothing Manipulation - Progress: Not met Pt Will Perform Toileting - Hygiene: Independently;Sit to stand from 3-in-1/toilet ADL Goal: Toileting - Hygiene - Progress: Not met  OT Evaluation Precautions/Restrictions  Precautions Precautions: Fall Restrictions Weight Bearing Restrictions: Yes RLE Weight Bearing:  (LIMITED) Prior Functioning Home Living Lives With: Alone Receives Help From: Family;Other (Comment) (paid caregiver) Type of Home: House Home Layout: One level Home Access: Stairs to enter [employer] of Steps: 2 Bathroom Shower/Tub: Tub/shower unit;Curtain Bathroom Toilet: Handicapped height Home Adaptive Equipment: Straight cane;Tub transfer bench;Hand-held shower hose;Wheelchair - powered;Other (comment) (walker--unsure if it has wheels) Prior Function Level of Independence: Requires assistive device for independence Driving: No Comments: Pts caregiver takes her to the store, assists with paying bills, does some meal prep and does housekeeping. ADL ADL Eating/Feeding: Simulated;Independent Where Assessed - Eating/Feeding: Chair Grooming: Performed;Wash/dry hands;Supervision/safety Where Assessed - Grooming: Standing at sink Upper Body Bathing: Simulated;Minimal assistance;Other (comment) (for back) Where Assessed - Upper Body Bathing: Sitting, chair Lower Body Bathing: Simulated;Moderate assistance Lower Body Bathing Details (indicate cue type and reason): Pt typically washes LEs with bath brush in shower. Where Assessed - Lower Body Bathing: Sitting, chair;Sit to stand from chair Upper Body Dressing: Simulated;Set up Upper Body Dressing Details (indicate cue type and reason): Pt reports donning sweatshirt with set up. Where Assessed - Upper Body Dressing: Sitting,  chair Lower Body Dressing: Performed;Moderate assistance Lower Body Dressing Details (indicate cue type and reason): pt able to donn socks, assist for slippers, pt typically sits at EOB to donn and wears slip on shoes Where Assessed - Lower Body Dressing: Sitting, chair;Sit to stand from chair Toilet Transfer: Performed;Minimal assistance Toilet Transfer Method: Proofreader: Bedside commode Equipment Used: Rolling walker Vision/Perception  Vision - History Baseline Vision: Wears glasses all the time Cognition Cognition Arousal/Alertness: Awake/alert Overall Cognitive Status: Appears within functional limits for tasks assessed Cognition - Other Comments: Pt had 2 hospital gowns and sweatpants and shirt on.  Stated she was going home, although d/c has not been planned at this point.  Caregivers states pt has some periods of confusion at home, but generally is cognitively intact. Sensation/Coordination Sensation Light Touch: Appears Intact Hot/Cold: Appears Intact Proprioception: Appears Intact Coordination Fine Motor Movements are Fluid and Coordinated: Yes Extremity Assessment RUE Assessment RUE Assessment: Within Functional Limits LUE Assessment LUE Assessment: Within Functional Limits Mobility  Bed Mobility Bed Mobility: No Transfers Transfers: Yes Sit to Stand: 5: Min guard assist;From chair/3-in-1;Other (comment) (leaned on walker to stand) Stand to Sit: 5: Min guard assist;To chair/3-in-1 End of Session OT - End of Session Equipment Utilized During Treatment: Gait belt Activity Tolerance: Patient tolerated treatment well Patient left: in chair;with family/visitor present;with call bell in reach General Behavior During Session: [facility] for tasks performed Cognition: [facility] for tasks performed   Evern Bio 02/01/2011, 1:04 PM  [phone]

## 2011-02-01 NOTE — Progress Notes (Signed)
Subjective: Feeling better today, able to bear some weight on her foot.  Objective: Weight change:   Intake/Output Summary (Last 24 hours) at 02/01/11 1618 Last data filed at 02/01/11 1400  Gross per 24 hour  Intake   1440 ml  Output    550 ml  Net    890 ml   BP 105/63  Pulse 58  Temp(Src) 97.7 F (36.5 C) (Oral)  Resp 16  Ht 5\' 4"  (1.626 m)  Wt 100.7 kg (222 lb 0.1 oz)  BMI 38.11 kg/m2  SpO2 95% NAD CTA B S1, S2 RRR Soft, BS+ Right foot looks better, no redness, no tenderness on palpation. Lab Results: Basic Metabolic Panel:  Basename 01/30/11 0339 01/29/11 1645  NA 135 137  K 5.1 4.1  CL 104 103  CO2 22 25  GLUCOSE 137* 118*  BUN 31* 30*  CREATININE 1.67* 1.49*  CALCIUM 8.8 9.2  MG -- --  PHOS -- --   Liver Function Tests: No results found for this basename: AST:2,ALT:2,ALKPHOS:2,BILITOT:2,PROT:2,ALBUMIN:2 in the last 72 hours No results found for this basename: LIPASE:2,AMYLASE:2 in the last 72 hours No results found for this basename: AMMONIA:2 in the last 72 hours CBC:  Basename 01/30/11 0339 01/29/11 1645  WBC 14.8* 14.0*  NEUTROABS -- --  HGB 9.8* 10.9*  HCT 30.5* 33.4*  MCV 94.4 93.8  PLT 259 279   Cardiac Enzymes: No results found for this basename: CKTOTAL:3,CKMB:3,CKMBINDEX:3,TROPONINI:3 in the last 72 hours BNP: No components found with this basename: POCBNP:3 D-Dimer: No results found for this basename: DDIMER:2 in the last 72 hours CBG:  Basename 01/31/11 0724 01/30/11 0759  GLUCAP 119* 155*   Hemoglobin A1C: No results found for this basename: HGBA1C in the last 72 hours Fasting Lipid Panel: No results found for this basename: CHOL,HDL,LDLCALC,TRIG,CHOLHDL,LDLDIRECT in the last 72 hours Thyroid Function Tests: No results found for this basename: TSH,T4TOTAL,FREET4,T3FREE,THYROIDAB in the last 72 hours Anemia Panel: No results found for this basename: VITAMINB12,FOLATE,FERRITIN,TIBC,IRON,RETICCTPCT in the last 72  hours Coagulation: No results found for this basename: LABPROT:2,INR:2 in the last 72 hours Urine Drug Screen: Drugs of Abuse  No results found for this basename: labopia, cocainscrnur, labbenz, amphetmu, thcu, labbarb    Alcohol Level: No results found for this basename: ETH:2 in the last 72 hours  Micro Results: No results found for this or any previous visit (from the past 240 hour(s)).  Studies/Results: Dg Chest 2 View  01/29/2011  *RADIOLOGY REPORT*  Clinical Data: Lower extremity swelling.  Nonsmoker.  CHEST - 2 VIEW  Comparison: None.  Findings: Cardiomegaly.  Poor inspiration with elevated right hemidiaphragm.  Lingula subsegmental atelectasis.  Central pulmonary vascular prominence.  No frank pulmonary edema.  No gross pneumothorax.  Prominent left shoulder joint degenerative changes.  Moderate right shoulder joint degenerative changes.  IMPRESSION: Cardiomegaly.  Central pulmonary vascular prominence without frank pulmonary edema.  Band-like atelectasis lingula.  Elevated anterior right hemidiaphragm.  Calcified mildly tortuous aorta.  Shoulder joint degenerative changes.  Carotid bifurcation calcifications.  Original Report Authenticated By: Fuller Canada, M.D.   Medications: Scheduled Meds:   . aspirin EC  81 mg Oral Daily  . brimonidine  1 drop Both Eyes BID  . enoxaparin  30 mg Subcutaneous Q24H  . famotidine  20 mg Oral Daily  . metoprolol  50 mg Oral Daily  . predniSONE  40 mg Oral Q breakfast  . ramipril  10 mg Oral BID  . simvastatin  40 mg Oral q1800  . sodium  chloride  3 mL Intravenous Q12H   Continuous Infusions:  PRN Meds:.sodium chloride, acetaminophen, acetaminophen, alum & mag hydroxide-simeth, morphine, ondansetron (ZOFRAN) IV, ondansetron, oxyCODONE, polyethylene glycol, sodium chloride, traMADol  Assessment/Plan: 1. Acute Gouty arthropathy.  Improved with oral prednisone Will continue taper 2. CKD 3, stable 3. Morbid obesity 4.  Deconditioning  Dispo:  Patient will be going home with physical therapy.  Her family will have the appropriate arrangements made for the patient to be discharged tomorrow.  Spoke to the patient and her son at length regarding her hospital course and her admission status.  He verbalized understanding that the patient did not meet inpatient criteria and is on observation status.  Will set patient up with social work to follow up as an outpatient to facilitate any placement issues in the future.   LOS: 3 days   MEMON,JEHANZEB 02/01/2011, 4:18 PM

## 2011-02-02 MED ORDER — PREDNISONE 20 MG PO TABS: ORAL_TABLET | ORAL | Status: DC

## 2011-02-02 MED ORDER — OXYCODONE-ACETAMINOPHEN 5-325 MG PO TABS: 1.0000 | ORAL_TABLET | Freq: Three times a day (TID) | ORAL | Status: AC | PRN

## 2011-02-02 NOTE — Telephone Encounter (Signed)
Called the numbers listed were not correct.

## 2011-02-02 NOTE — Discharge Summary (Signed)
Physician Discharge Summary  Patient ID: ARMYA WESTERHOFF MRN: [id number] DOB/AGE: Oct 07, 1921 77 y.o.  Admit date: 01/29/2011 Discharge date: 02/02/2011  Primary Care Physician:  Oliver Barre, MD, MD   Discharge Diagnoses:    Principal Problem:  *GOUT Active Problems:  HYPERLIPIDEMIA  OBESITY, MORBID  DEPRESSION  HYPERTENSION  CORONARY ARTERY DISEASE  CONGESTIVE HEART FAILURE  GERD  CKD (chronic kidney disease) stage 2, GFR 60-89 ml/min    Current Discharge Medication List    START taking these medications   Details  oxyCODONE-acetaminophen (PERCOCET) 5-325 MG per tablet Take 1 tablet by mouth every 8 (eight) hours as needed for pain. Qty: 12 tablet, Refills: 0    predniSONE (DELTASONE) 20 MG tablet Take 30mg  po daily for 2 days, then 20mg  po daily for 2 days then 10mg  po daily for 2 days then stop Qty: 12 tablet, Refills: 0      CONTINUE these medications which have NOT CHANGED   Details  aspirin (ECOTRIN LOW STRENGTH) 81 MG EC tablet Take 81 mg by mouth daily.      brimonidine (ALPHAGAN P) 0.1 % SOLN Place 1 drop into both eyes 2 (two) times daily.      metoprolol (TOPROL-XL) 50 MG 24 hr tablet Take 50 mg by mouth daily.      ramipril (ALTACE) 10 MG capsule Take 10 mg by mouth 2 (two) times daily.      ranitidine (ZANTAC) 150 MG tablet Take 150 mg by mouth as needed. Acid reflux    simvastatin (ZOCOR) 40 MG tablet TAKE 1 TABLET BY MOUTH EVERY DAY Qty: 30 tablet, Refills: 11    traMADol (ULTRAM) 50 MG tablet Take 50 mg by mouth every 6 (six) hours as needed. Pain.... Maximum dose= 8 tablets per day       STOP taking these medications     triamterene-hydrochlorothiazide (MAXZIDE-25) 37.5-25 MG per tablet          Disposition and Follow-up:  Patient will be discharged home in stable condition.  She will need to follow up with her primary doctor.  She is being set up with home care services to include RN, PT/OT, Aide and Child psychotherapist.  She is  ambulating with the assistance of a walker.             Consults:  None   Significant Diagnostic Studies:  Dg Chest 2 View  01/29/2011  *RADIOLOGY REPORT*  Clinical Data: Lower extremity swelling.  Nonsmoker.  CHEST - 2 VIEW  Comparison: None.  Findings: Cardiomegaly.  Poor inspiration with elevated right hemidiaphragm.  Lingula subsegmental atelectasis.  Central pulmonary vascular prominence.  No frank pulmonary edema.  No gross pneumothorax.  Prominent left shoulder joint degenerative changes.  Moderate right shoulder joint degenerative changes.  IMPRESSION: Cardiomegaly.  Central pulmonary vascular prominence without frank pulmonary edema.  Band-like atelectasis lingula.  Elevated anterior right hemidiaphragm.  Calcified mildly tortuous aorta.  Shoulder joint degenerative changes.  Carotid bifurcation calcifications.  Original Report Authenticated By: Fuller Canada, M.D.    Brief H and P: For complete details please refer to admission H and P, but in brief Patient is an 76 year old white female with past medical history of diastolic heart failure, morbid obesity and CAD who for the past one to 2 weeks has been complaining of some pain in her hip and knee. She was evaluated by her PCP to was concerned about the possibility of worsening DJD and was looking into steroid injections versus orthopedic management. Over the  last few days patient started having some increased pain around the area at the base of her right great toe and today the pain became so severe she was unable to walk at all. She came to emergency room for further evaluation  In the emergency room, she was noted to have a CRP of 11.9, white count of 14 and a sed rate of 60. Her uric acid level was elevated at 9.3. This is all consistent with acute gouty attack. Patient was given prednisone, but still unable to walk. She was at home herself with family who live nearby but they're unable to care for her and because of her difficulty  ambulation and concerns for falls it was felt best that she come in for further treatment.     Hospital Course:  Patient was admitted to the hospital for acute gouty arthropathy, and inability to ambulate.  She had received 2 doses of colchicine and started on a course of prednisone.  With these measures she has had significant improvement, and her pain has near resolved.  Her thiazide diuretic has been discontinued as this may be contributing to her gout flare.  She is currently on a prednisone taper. She was seen by physical therapy and felt that she may benefit from skilled nursing facility placement for rehab.  Unfortunately, based on the level of care the patient required in the hospital, she was admitted under an observation status and did not meet inpatient status criteria.  For this reason, placement to a SNF was not possible from the hospital, since the patient would be required to pay out of pocket.  This was explained to the patient and her family and they verbalized understanding.  She has been set up with home health services as well as an outpatient social worker to assist with any placement if need be in the future.  Time spent on Discharge:  Signed: Chrsitopher Wik [facility] 02/02/2011, 8:30 AM

## 2011-02-02 NOTE — Progress Notes (Signed)
Patient and her son given discharge instructions and they verbalized understanding.  Home health arranged through [facility].  Confirmed with Raiford Noble, CM.  Patient stable for discharge home.  Understands her follow up and when to call the MD.  Elease Etienne. 02/02/2011 1130

## 2011-02-06 ENCOUNTER — Telehealth: Payer: Self-pay

## 2011-02-06 ENCOUNTER — Ambulatory Visit (INDEPENDENT_AMBULATORY_CARE_PROVIDER_SITE_OTHER): Payer: Medicare Other | Admitting: Internal Medicine

## 2011-02-06 ENCOUNTER — Encounter: Payer: Self-pay | Admitting: Internal Medicine

## 2011-02-06 VITALS — BP 110/58 | HR 54 | Temp 98.7°F | Ht 62.0 in

## 2011-02-06 DIAGNOSIS — I10 Essential (primary) hypertension: Secondary | ICD-10-CM

## 2011-02-06 DIAGNOSIS — F329 Major depressive disorder, single episode, unspecified: Secondary | ICD-10-CM

## 2011-02-06 DIAGNOSIS — M25561 Pain in right knee: Secondary | ICD-10-CM

## 2011-02-06 DIAGNOSIS — M25569 Pain in unspecified knee: Secondary | ICD-10-CM

## 2011-02-06 DIAGNOSIS — M109 Gout, unspecified: Secondary | ICD-10-CM

## 2011-02-06 HISTORY — DX: Gout, unspecified: M10.9

## 2011-02-06 MED ORDER — ALLOPURINOL 100 MG PO TABS: 100.0000 mg | ORAL_TABLET | Freq: Every day | ORAL | Status: DC

## 2011-02-06 MED ORDER — COLCHICINE 0.6 MG PO TABS: ORAL_TABLET | ORAL | Status: DC

## 2011-02-06 NOTE — Patient Instructions (Addendum)
Take all new medications as prescribed Continue all other medications as before Please return in 6 months or sooner if needed

## 2011-02-06 NOTE — Telephone Encounter (Signed)
Ok for verbal 

## 2011-02-06 NOTE — Telephone Encounter (Signed)
[facility] informed 

## 2011-02-06 NOTE — Telephone Encounter (Signed)
[facility] requesting verbal ok for durable med. Supplies, a rolling walker and a bedside commode. Call back number to [facility] [phone] ext. [phone]

## 2011-02-07 ENCOUNTER — Encounter: Payer: Self-pay | Admitting: Internal Medicine

## 2011-02-07 NOTE — Assessment & Plan Note (Signed)
Recent episode acute symptoms resolved, first episode but pt keen to prevent further as it caused her hospn and high risk fall;  For allopurinol 100 qd, f/u uric acid,  to f/u any worsening symptoms or concerns

## 2011-02-07 NOTE — Progress Notes (Signed)
Subjective:    Patient ID: Beth Gutierrez, female    DOB: [birth year], 77 y.o.   MRN: [id number]  HPI  Here to f/u post hospn,  S/p tx for sudden onset severe right knee effusion/pain c/w acute gout flare, tx with IV steroid, pain med, and PT to which she has responded well,  No prior hx of gout, admits to diet with excess white meat (purposefully trying to avoid red meat and chol) but overdoing the white meat, and known mild CKD.   Now ambulation much improved, with cane now, no recent falls, fever or other trauma, injury. Pt denies chest pain, increased sob or doe, wheezing, orthopnea, PND, increased LE swelling, palpitations, dizziness or syncope.  Pt denies new neurological symptoms such as new headache, or facial or extremity weakness or numbness  Pt denies polydipsia, polyuria.  Has underlying right knee DJD for which she sees ortho with cortisone every 6 mo. Denies worsening depressive symptoms, suicidal ideation, or panic. Past Medical History  Diagnosis Date  . Acute myocardial infarction, unspecified site, episode of care unspecified   . Congestive heart failure, unspecified   . Coronary atherosclerosis of unspecified type of vessel, native or graft   . Unspecified essential hypertension   . Other and unspecified hyperlipidemia   . Esophageal reflux   . Depressive disorder, not elsewhere classified   . Morbid obesity   . Need for prophylactic vaccination and inoculation against influenza   . Allergic rhinitis, cause unspecified   . Other malaise and fatigue   . Disorder of bone and cartilage, unspecified   . Diverticulosis of colon (without mention of hemorrhage)   . Benign neoplasm of colon   . Gout, unspecified   . Unspecified glaucoma   . Osteoarthrosis, unspecified whether generalized or localized, unspecified site   . Gout flare 02/06/2011   Past Surgical History  Procedure Date  . Coronary angioplasty with stent placement     RCA stent  . Vesicovaginal fistula  closure w/ tah   . Left hip replacement     s/p 2002. Secondary to DJD  . Ectopic pregnancy surgery     reports that she quit smoking about 20 years ago. She has never used smokeless tobacco. She reports that she does not drink alcohol or use illicit drugs. family history includes Breast cancer in her sister; Hypertension in her father; and Stroke in her father. Allergies  Allergen Reactions  . Sulfonamide Derivatives Other (See Comments)    unknown   Current Outpatient Prescriptions on File Prior to Visit  Medication Sig Dispense Refill  . aspirin (ECOTRIN LOW STRENGTH) 81 MG EC tablet Take 81 mg by mouth daily.        . brimonidine (ALPHAGAN P) 0.1 % SOLN Place 1 drop into both eyes 2 (two) times daily.        . metoprolol (TOPROL-XL) 50 MG 24 hr tablet Take 50 mg by mouth daily.        Marland Kitchen oxyCODONE-acetaminophen (PERCOCET) 5-325 MG per tablet Take 1 tablet by mouth every 8 (eight) hours as needed for pain.  12 tablet  0  . predniSONE (DELTASONE) 20 MG tablet Take 30mg  po daily for 2 days, then 20mg  po daily for 2 days then 10mg  po daily for 2 days then stop  12 tablet  0  . ramipril (ALTACE) 10 MG capsule Take 10 mg by mouth 2 (two) times daily.        . ranitidine (ZANTAC) 150 MG tablet Take 150  mg by mouth as needed. Acid reflux      . simvastatin (ZOCOR) 40 MG tablet TAKE 1 TABLET BY MOUTH EVERY DAY  30 tablet  11  . traMADol (ULTRAM) 50 MG tablet Take 50 mg by mouth every 6 (six) hours as needed. Pain.... Maximum dose= 8 tablets per day        Review of Systems Review of Systems  Constitutional: Negative for diaphoresis and unexpected weight change.  HENT: Negative for drooling and tinnitus.   Eyes: Negative for photophobia and visual disturbance.  Respiratory: Negative for choking and stridor.   Gastrointestinal: Negative for vomiting and blood in stool.  Genitourinary: Negative for hematuria and decreased urine volume.      Objective:   Physical Exam BP 110/58  Pulse 54   Temp(Src) 98.7 F (37.1 C) (Oral)  Ht 5\' 2"  (1.575 m)  SpO2 97% Physical Exam  VS noted, remarkably cognizant for her age Constitutional: Pt appears well-developed and well-nourished.  HENT: Head: Normocephalic.  Right Ear: External ear normal.  Left Ear: External ear normal.  Bilat tm's mild erythema.  Sinus nontender.  Pharynx mild erythema Eyes: Conjunctivae and EOM are normal. Pupils are equal, round, and reactive to light.  Neck: Normal range of motion. Neck supple.  Cardiovascular: Normal rate and regular rhythm.   Pulmonary/Chest: Effort normal and breath sounds normal.  Right knee with crepitus, no effusion, mild decreased ROM, not warn and NT Neurological: Pt is alert. No cranial nerve deficit.  Skin: Skin is warm. No erythema.  Psychiatric: Pt behavior is normal. Thought content normal. Not depressed affect    Assessment & Plan:

## 2011-02-07 NOTE — Assessment & Plan Note (Signed)
stable overall by hx and exam, most recent data reviewed with pt, and pt to continue medical tx as before,   Lab Results  Component Value Date   WBC 14.8* 01/30/2011   HGB 9.8* 01/30/2011   HCT 30.5* 01/30/2011   PLT 259 01/30/2011   GLUCOSE 137* 01/30/2011   CHOL 152 04/21/2010   TRIG 189.0* 04/21/2010   HDL 37.70* 04/21/2010   LDLCALC 77 04/21/2010   ALT 10 04/21/2010   AST 13 04/21/2010   NA 135 01/30/2011   K 5.1 01/30/2011   CL 104 01/30/2011   CREATININE 1.67* 01/30/2011   BUN 31* 01/30/2011   CO2 22 01/30/2011   TSH 0.95 04/21/2010

## 2011-02-07 NOTE — Assessment & Plan Note (Signed)
stable overall by hx and exam, most recent data reviewed with pt, and pt to continue medical treatment as before  BP Readings from Last 3 Encounters:  02/06/11 110/58  02/02/11 112/61  01/06/11 116/62

## 2011-02-07 NOTE — Assessment & Plan Note (Signed)
O/w stable - to f/u ortho as planned

## 2011-02-17 ENCOUNTER — Telehealth: Payer: Self-pay

## 2011-02-17 NOTE — Telephone Encounter (Signed)
Called left message to call back 

## 2011-02-17 NOTE — Telephone Encounter (Signed)
Ok for verbal 

## 2011-02-17 NOTE — Telephone Encounter (Signed)
[facility] called requesting additional orders to continue home visit with pt for another week for teaching.   Verbal authorization given per JWJ.

## 2011-02-19 ENCOUNTER — Other Ambulatory Visit: Payer: Self-pay | Admitting: Cardiology

## 2011-02-20 NOTE — Telephone Encounter (Signed)
[facility]  Informed of verbal ok.

## 2011-02-20 NOTE — Telephone Encounter (Signed)
Called [facility] left message to call back 

## 2011-02-27 DIAGNOSIS — I509 Heart failure, unspecified: Secondary | ICD-10-CM

## 2011-02-27 DIAGNOSIS — I129 Hypertensive chronic kidney disease with stage 1 through stage 4 chronic kidney disease, or unspecified chronic kidney disease: Secondary | ICD-10-CM

## 2011-02-27 DIAGNOSIS — I5022 Chronic systolic (congestive) heart failure: Secondary | ICD-10-CM

## 2011-02-27 DIAGNOSIS — M109 Gout, unspecified: Secondary | ICD-10-CM

## 2011-03-24 ENCOUNTER — Telehealth: Payer: Self-pay

## 2011-03-24 NOTE — Telephone Encounter (Signed)
Completed letter and called the patient to pickup at the front desk.

## 2011-03-24 NOTE — Telephone Encounter (Signed)
The patient has been subpoenaed to appear in court to testify for a situation in her neighborhood. The patient is requesting a letter excusing her due to medical reasons. She stated she also has no transportation, uses a walker and just got out of the hospital. Please advise call back number when letter is complete is [phone]

## 2011-03-24 NOTE — Telephone Encounter (Signed)
Ok for note to indicate to please excuse from testimony due to advance age with mult medical problems, poor ability to ambulate and high risk for fall

## 2011-04-03 ENCOUNTER — Other Ambulatory Visit: Payer: Self-pay | Admitting: Cardiology

## 2011-04-05 ENCOUNTER — Ambulatory Visit (INDEPENDENT_AMBULATORY_CARE_PROVIDER_SITE_OTHER): Payer: Medicare Other | Admitting: Internal Medicine

## 2011-04-05 ENCOUNTER — Encounter: Payer: Self-pay | Admitting: Internal Medicine

## 2011-04-05 ENCOUNTER — Other Ambulatory Visit (INDEPENDENT_AMBULATORY_CARE_PROVIDER_SITE_OTHER): Payer: Medicare Other

## 2011-04-05 VITALS — BP 122/70 | HR 68 | Temp 97.5°F | Ht 61.0 in | Wt 225.1 lb

## 2011-04-05 DIAGNOSIS — R5383 Other fatigue: Secondary | ICD-10-CM

## 2011-04-05 DIAGNOSIS — N182 Chronic kidney disease, stage 2 (mild): Secondary | ICD-10-CM

## 2011-04-05 DIAGNOSIS — R5381 Other malaise: Secondary | ICD-10-CM

## 2011-04-05 DIAGNOSIS — M109 Gout, unspecified: Secondary | ICD-10-CM

## 2011-04-05 DIAGNOSIS — E785 Hyperlipidemia, unspecified: Secondary | ICD-10-CM

## 2011-04-05 DIAGNOSIS — I10 Essential (primary) hypertension: Secondary | ICD-10-CM

## 2011-04-05 DIAGNOSIS — D509 Iron deficiency anemia, unspecified: Secondary | ICD-10-CM | POA: Insufficient documentation

## 2011-04-05 DIAGNOSIS — D649 Anemia, unspecified: Secondary | ICD-10-CM

## 2011-04-05 HISTORY — DX: Anemia, unspecified: D64.9

## 2011-04-05 LAB — HEPATIC FUNCTION PANEL
AST: 14 U/L (ref 0–37)
Albumin: 3.7 g/dL (ref 3.5–5.2)
Alkaline Phosphatase: 66 U/L (ref 39–117)
Total Protein: 6.6 g/dL (ref 6.0–8.3)

## 2011-04-05 LAB — BASIC METABOLIC PANEL
CO2: 28 mEq/L (ref 19–32)
Calcium: 8.5 mg/dL (ref 8.4–10.5)
Glucose, Bld: 91 mg/dL (ref 70–99)
Potassium: 4.6 mEq/L (ref 3.5–5.1)
Sodium: 143 mEq/L (ref 135–145)

## 2011-04-05 LAB — LIPID PANEL
HDL: 42.6 mg/dL (ref >39.00)
Total CHOL/HDL Ratio: 4
Triglycerides: 163 mg/dL — ABNORMAL HIGH (ref 0.0–149.0)
VLDL: 32.6 mg/dL (ref 0.0–40.0)

## 2011-04-05 LAB — URIC ACID: Uric Acid, Serum: 6.9 mg/dL (ref 2.4–7.0)

## 2011-04-05 LAB — CBC WITH DIFFERENTIAL/PLATELET
Basophils Absolute: 0.1 10*3/uL (ref 0.0–0.1)
Eosinophils Absolute: 0.1 10*3/uL (ref 0.0–0.7)
Lymphocytes Relative: 19.3 % (ref 12.0–46.0)
MCHC: 32.2 g/dL (ref 30.0–36.0)
Monocytes Relative: 5.3 % (ref 3.0–12.0)
Neutrophils Relative %: 73.8 % (ref 43.0–77.0)
RBC: 3.33 Mil/uL — ABNORMAL LOW (ref 3.87–5.11)
RDW: 16.8 % — ABNORMAL HIGH (ref 11.5–14.6)

## 2011-04-05 LAB — IBC PANEL
Saturation Ratios: 17.3 % — ABNORMAL LOW (ref 20.0–50.0)
Transferrin: 244.3 mg/dL (ref 212.0–360.0)

## 2011-04-05 LAB — TSH: TSH: 0.95 u[IU]/mL (ref 0.35–5.50)

## 2011-04-05 MED ORDER — CLOTRIMAZOLE-BETAMETHASONE 1-0.05 % EX CREA: TOPICAL_CREAM | CUTANEOUS | Status: DC

## 2011-04-05 NOTE — Assessment & Plan Note (Signed)
stable volume overall by hx and exam, most recent data reviewed with pt, and pt to continue medical treatment as before  Lab Results  Component Value Date   CREATININE 1.67* 01/30/2011

## 2011-04-05 NOTE — Patient Instructions (Signed)
Your SCAT form was filled out today Take all new medications as prescribed - the cream Continue all other medications as before Please have the pharmacy call if you need refills Please go to LAB in the Basement for the blood and/or urine tests to be done today Please call the phone number [phone] (the [employer] System) for results of testing in 2-3 days;  When calling, simply dial the number, and when prompted enter the MRN number above (the Medical Record Number) and the # key, then the message should start. Please return in 6 months, or sooner if needed

## 2011-04-05 NOTE — Assessment & Plan Note (Signed)
stable overall by hx and exam, most recent data reviewed with pt, and pt to continue medical treatment as before  Lab Results  Component Value Date   WBC 14.8* 01/30/2011   HGB 9.8* 01/30/2011   HCT 30.5* 01/30/2011   MCV 94.4 01/30/2011   PLT 259 01/30/2011

## 2011-04-05 NOTE — Assessment & Plan Note (Addendum)
stable overall by hx and exam, and pt to continue medical treatment as before, check uric acid - none on file

## 2011-04-05 NOTE — Assessment & Plan Note (Signed)
stable overall by hx and exam, most recent data reviewed with pt, and pt to continue medical treatment as before  BP Readings from Last 3 Encounters:  04/05/11 122/70  02/06/11 110/58  02/02/11 112/61

## 2011-04-05 NOTE — Assessment & Plan Note (Signed)
stable overall by hx and exam, most recent data reviewed with pt, and pt to continue medical treatment as before  Lab Results  Component Value Date   LDLCALC 77 04/21/2010   For lipids today, cont diet

## 2011-04-05 NOTE — Assessment & Plan Note (Signed)
Etiology unclear, Exam otherwise benign, to check labs as documented, follow with expectant management  

## 2011-04-05 NOTE — Progress Notes (Signed)
Subjective:    Patient ID: Beth Gutierrez, female    DOB: 07-15-1921, 78 y.o.   MRN: [id number]  HPI  Here to f/u; overall doing ok,  Pt denies chest pain, increased sob or doe, wheezing, orthopnea, PND, increased LE swelling, palpitations, dizziness or syncope.  Pt denies new neurological symptoms such as new headache, or facial or extremity weakness or numbness   Pt denies polydipsia, polyuria, or low sugar symptoms such as weakness or confusion improved with po intake.  Pt states overall good compliance with meds, trying to follow lower cholesterol diet, wt overall stable but little exercise however.  Does have nonitchy rash to soles of feet, no pain, wears compression type stockings which tends to dry her skin.  Overall quite vigorous congnitive for her age; but does require permanent walker, and has aide for help as well.  Due to this, has SCAT form to fill out for transport purpose as she has no other transport and cannot ride city bus.  Does have sense of ongoing fatigue, but denies signficant hypersomnolence.  Denies worsening depressive symptoms, suicidal ideation, or panic.   No recent overt acute gout or other significant joint pain.  No overt bleeding or bruising Past Medical History  Diagnosis Date  . Acute myocardial infarction, unspecified site, episode of care unspecified   . Congestive heart failure, unspecified   . Coronary atherosclerosis of unspecified type of vessel, native or graft   . Unspecified essential hypertension   . Other and unspecified hyperlipidemia   . Esophageal reflux   . Depressive disorder, not elsewhere classified   . Morbid obesity   . Need for prophylactic vaccination and inoculation against influenza   . Allergic rhinitis, cause unspecified   . Other malaise and fatigue   . Disorder of bone and cartilage, unspecified   . Diverticulosis of colon (without mention of hemorrhage)   . Benign neoplasm of colon   . Gout, unspecified   . Unspecified  glaucoma   . Osteoarthrosis, unspecified whether generalized or localized, unspecified site   . Gout flare 02/06/2011  . Anemia, unspecified 04/05/2011   Past Surgical History  Procedure Date  . Coronary angioplasty with stent placement     RCA stent  . Vesicovaginal fistula closure w/ tah   . Left hip replacement     s/p 2002. Secondary to DJD  . Ectopic pregnancy surgery     reports that she quit smoking about 20 years ago. She has never used smokeless tobacco. She reports that she does not drink alcohol or use illicit drugs. family history includes Breast cancer in her sister; Hypertension in her father; and Stroke in her father. Allergies  Allergen Reactions  . Sulfonamide Derivatives Other (See Comments)    unknown   Current Outpatient Prescriptions on File Prior to Visit  Medication Sig Dispense Refill  . allopurinol (ZYLOPRIM) 100 MG tablet Take 1 tablet (100 mg total) by mouth daily.  90 tablet  3  . aspirin (ECOTRIN LOW STRENGTH) 81 MG EC tablet Take 81 mg by mouth daily.        . brimonidine (ALPHAGAN P) 0.1 % SOLN Place 1 drop into both eyes 2 (two) times daily.        . colchicine 0.6 MG tablet 1 tab by mouth every hour at onset gout pain and swelling for maximum 6 doses or onset diarrhea  40 tablet  1  . metoprolol (TOPROL-XL) 50 MG 24 hr tablet TAKE 1 TABLET BY MOUTH ONCE A  DAY  30 tablet  9  . predniSONE (DELTASONE) 20 MG tablet Take 30mg  po daily for 2 days, then 20mg  po daily for 2 days then 10mg  po daily for 2 days then stop  12 tablet  0  . ramipril (ALTACE) 10 MG capsule TAKE 1 CAPSULE TWICE DAILY  60 capsule  9  . ranitidine (ZANTAC) 150 MG tablet Take 150 mg by mouth as needed. Acid reflux      . simvastatin (ZOCOR) 40 MG tablet TAKE 1 TABLET BY MOUTH EVERY DAY  30 tablet  11  . traMADol (ULTRAM) 50 MG tablet Take 50 mg by mouth every 6 (six) hours as needed. Pain.... Maximum dose= 8 tablets per day       . triamterene-hydrochlorothiazide (MAXZIDE-25) 37.5-25 MG  per tablet TAKE 1 TABLET BY MOUTH ONCE A DAY  30 tablet  4   Review of Systems Review of Systems  Constitutional: Negative for diaphoresis and unexpected weight change.  HENT: Negative for drooling and tinnitus.   Eyes: Negative for photophobia and visual disturbance.  Respiratory: Negative for choking and stridor.   Gastrointestinal: Negative for vomiting and blood in stool.  Genitourinary: Negative for hematuria and decreased urine volume.  Neurological: Negative for tremors and numbness.  Psychiatric/Behavioral: Negative for decreased concentration. The patient is not hyperactive.       Objective:   Physical Exam BP 122/70  Pulse 68  Temp(Src) 97.5 F (36.4 C) (Oral)  Ht 5\' 1"  (1.549 m)  Wt 225 lb 2 oz (102.116 kg)  BMI 42.54 kg/m2  SpO2 98% Physical Exam  VS noted Constitutional: Pt appears well-developed and well-nourished.  HENT: Head: Normocephalic.  Right Ear: External ear normal.  Left Ear: External ear normal.  Eyes: Conjunctivae and EOM are normal. Pupils are equal, round, and reactive to light.  Neck: Normal range of motion. Neck supple.  Cardiovascular: Normal rate and regular rhythm.   Pulmonary/Chest: Effort normal and breath sounds normal. - no rales or wheezing Abd:  Soft, NT, non-distended, + BS Neurological: Pt is alert. No cranial nerve deficit.  Feet with trace edema to ankles Skin: Skin is warm. No erythema. except for whitish rash to soles of feet, nontender, no ulcers, Psychiatric: Pt behavior is normal. Thought content normal. Not overly nervous or depressed affect    Assessment & Plan:

## 2011-08-09 ENCOUNTER — Ambulatory Visit: Payer: Medicare Other | Admitting: Internal Medicine

## 2011-08-14 ENCOUNTER — Other Ambulatory Visit: Payer: Self-pay | Admitting: Cardiology

## 2011-10-11 ENCOUNTER — Ambulatory Visit: Payer: Medicare Other | Admitting: Cardiology

## 2011-10-11 ENCOUNTER — Ambulatory Visit: Payer: Medicare Other | Admitting: Internal Medicine

## 2011-10-11 ENCOUNTER — Other Ambulatory Visit: Payer: Self-pay | Admitting: Cardiology

## 2011-10-11 NOTE — Telephone Encounter (Signed)
Patient will get more refills after his appointment 10-12-2011. Fax Received. Refill Completed. Beth Gutierrez (R.M.A)

## 2011-10-12 ENCOUNTER — Ambulatory Visit (INDEPENDENT_AMBULATORY_CARE_PROVIDER_SITE_OTHER): Payer: Medicare Other | Admitting: Cardiology

## 2011-10-12 ENCOUNTER — Encounter: Payer: Self-pay | Admitting: Cardiology

## 2011-10-12 VITALS — BP 130/60 | HR 62 | Ht 61.0 in | Wt 223.0 lb

## 2011-10-12 DIAGNOSIS — I2510 Atherosclerotic heart disease of native coronary artery without angina pectoris: Secondary | ICD-10-CM

## 2011-10-12 DIAGNOSIS — E785 Hyperlipidemia, unspecified: Secondary | ICD-10-CM

## 2011-10-12 DIAGNOSIS — I10 Essential (primary) hypertension: Secondary | ICD-10-CM

## 2011-10-12 NOTE — Progress Notes (Signed)
HPI Beth Gutierrez returns today for evaluation and management of her history of coronary artery disease and history of myocardial infarction.  Since her last visit, she's had no chest pain or angina. She denies orthopnea, PND or edema. She's had no palpitations.  Her biggest problem again is her arthritis particularly of her knees. She is using a walker and moves fairly slowly. Compliant with her medications and her most recent blood work at goal for LDL.  Past Medical History  Diagnosis Date  . Acute myocardial infarction, unspecified site, episode of care unspecified   . Congestive heart failure, unspecified   . Coronary atherosclerosis of unspecified type of vessel, native or graft   . Unspecified essential hypertension   . Other and unspecified hyperlipidemia   . Esophageal reflux   . Depressive disorder, not elsewhere classified   . Morbid obesity   . Need for prophylactic vaccination and inoculation against influenza   . Allergic rhinitis, cause unspecified   . Other malaise and fatigue   . Disorder of bone and cartilage, unspecified   . Diverticulosis of colon (without mention of hemorrhage)   . Benign neoplasm of colon   . Gout, unspecified   . Unspecified glaucoma   . Osteoarthrosis, unspecified whether generalized or localized, unspecified site   . Gout flare 02/06/2011  . Anemia, unspecified 04/05/2011    Current Outpatient Prescriptions  Medication Sig Dispense Refill  . allopurinol (ZYLOPRIM) 100 MG tablet Take 1 tablet (100 mg total) by mouth daily.  90 tablet  3  . aspirin (ECOTRIN LOW STRENGTH) 81 MG EC tablet Take 81 mg by mouth daily.        . brimonidine (ALPHAGAN P) 0.1 % SOLN Place 1 drop into both eyes 2 (two) times daily.        . ramipril (ALTACE) 10 MG capsule TAKE 1 CAPSULE TWICE DAILY  60 capsule  9  . ranitidine (ZANTAC) 150 MG tablet Take 150 mg by mouth as needed. Acid reflux      . simvastatin (ZOCOR) 40 MG tablet TAKE 1 TABLET BY MOUTH EVERY DAY  30  tablet  1  . traMADol (ULTRAM) 50 MG tablet Take 50 mg by mouth every 6 (six) hours as needed. Pain.... Maximum dose= 8 tablets per day       . triamterene-hydrochlorothiazide (MAXZIDE-25) 37.5-25 MG per tablet TAKE 1 TABLET BY MOUTH ONCE A DAY  30 tablet  2    Allergies  Allergen Reactions  . Sulfonamide Derivatives Other (See Comments)    unknown    Family History  Problem Relation Age of Onset  . Breast cancer Sister   . Hypertension Father   . Stroke Father     History   Social History  . Marital Status: Widowed    Spouse Name: N/A    Number of Children: 2  . Years of Education: N/A   Occupational History  . retired [employer]    Social History Main Topics  . Smoking status: Former Smoker    Quit date: 05/30/1990  . Smokeless tobacco: Never Used  . Alcohol Use: No  . Drug Use: No  . Sexually Active: No   Other Topics Concern  . Not on file   Social History Narrative   Lives alone    ROS ALL NEGATIVE EXCEPT THOSE NOTED IN HPI  PE  General Appearance: well developed, well nourished in no acute distress, overweight, using a walker or HEENT: symmetrical face, PERRLA, good dentition  Neck: no  JVD, thyromegaly, or adenopathy, trachea midline Chest: symmetric without deformity Cardiac: PMI IS APPRECIATED RRR, normal S1, S2, no gallop or murmur Lung: clear to ausculation and percussion Vascular: Diminished in the lower extremities Abdominal: nondistended, nontender, good bowel sounds, no HSM, no bruits Extremities: no cyanosis, clubbing or edema, no sign of DVT, no varicosities  Skin: normal color, no rashes Neuro: alert and oriented x 3, non-focal Pysch: normal affect  EKG Not repeated BMET    Component Value Date/Time   NA 143 04/05/2011 1204   K 4.6 04/05/2011 1204   CL 106 04/05/2011 1204   CO2 28 04/05/2011 1204   GLUCOSE 91 04/05/2011 1204   BUN 16 04/05/2011 1204   CREATININE 1.1 04/05/2011 1204   CALCIUM 8.5 04/05/2011 1204   GFRNONAA  26* 01/30/2011 0339   GFRAA 31* 01/30/2011 0339    Lipid Panel     Component Value Date/Time   CHOL 174 04/05/2011 1204   TRIG 163.0* 04/05/2011 1204   HDL 42.60 04/05/2011 1204   CHOLHDL 4 04/05/2011 1204   VLDL 32.6 04/05/2011 1204   LDLCALC 99 04/05/2011 1204    CBC    Component Value Date/Time   WBC 10.1 04/05/2011 1204   RBC 3.33* 04/05/2011 1204   HGB 10.0* 04/05/2011 1204   HCT 31.1* 04/05/2011 1204   PLT 274.0 04/05/2011 1204   MCV 93.4 04/05/2011 1204   MCH 30.3 01/30/2011 0339   MCHC 32.2 04/05/2011 1204   RDW 16.8* 04/05/2011 1204   LYMPHSABS 1.9 04/05/2011 1204   MONOABS 0.5 04/05/2011 1204   EOSABS 0.1 04/05/2011 1204   BASOSABS 0.1 04/05/2011 1204

## 2011-10-12 NOTE — Patient Instructions (Addendum)
Your physician recommends that you continue on your current medications as directed. Please refer to the Current Medication list given to you today.   Your physician wants you to follow-up in: 1 year with Dr. Wall. You will receive a reminder letter in the mail two months in advance. If you don't receive a letter, please call our office to schedule the follow-up appointment.  

## 2011-10-12 NOTE — Assessment & Plan Note (Signed)
LDL at goal. No change in medication.

## 2011-10-12 NOTE — Assessment & Plan Note (Signed)
Stable. Continue secondary preventative therapy. 

## 2011-10-13 ENCOUNTER — Ambulatory Visit (INDEPENDENT_AMBULATORY_CARE_PROVIDER_SITE_OTHER): Payer: Medicare Other | Admitting: Internal Medicine

## 2011-10-13 ENCOUNTER — Encounter: Payer: Self-pay | Admitting: Internal Medicine

## 2011-10-13 ENCOUNTER — Other Ambulatory Visit (INDEPENDENT_AMBULATORY_CARE_PROVIDER_SITE_OTHER): Payer: Medicare Other

## 2011-10-13 VITALS — BP 122/64 | HR 80 | Temp 97.6°F | Ht 63.0 in | Wt 225.0 lb

## 2011-10-13 DIAGNOSIS — M25519 Pain in unspecified shoulder: Secondary | ICD-10-CM

## 2011-10-13 DIAGNOSIS — M25511 Pain in right shoulder: Secondary | ICD-10-CM

## 2011-10-13 DIAGNOSIS — M25512 Pain in left shoulder: Secondary | ICD-10-CM | POA: Insufficient documentation

## 2011-10-13 DIAGNOSIS — N182 Chronic kidney disease, stage 2 (mild): Secondary | ICD-10-CM

## 2011-10-13 DIAGNOSIS — I10 Essential (primary) hypertension: Secondary | ICD-10-CM

## 2011-10-13 DIAGNOSIS — R269 Unspecified abnormalities of gait and mobility: Secondary | ICD-10-CM

## 2011-10-13 LAB — CBC WITH DIFFERENTIAL/PLATELET
Eosinophils Relative: 2.1 % (ref 0.0–5.0)
HCT: 31 % — ABNORMAL LOW (ref 36.0–46.0)
Hemoglobin: 10.1 g/dL — ABNORMAL LOW (ref 12.0–15.0)
Lymphs Abs: 1.5 10*3/uL (ref 0.7–4.0)
Monocytes Relative: 7.2 % (ref 3.0–12.0)
Neutro Abs: 6.2 10*3/uL (ref 1.4–7.7)
RBC: 3.14 Mil/uL — ABNORMAL LOW (ref 3.87–5.11)
WBC: 8.5 10*3/uL (ref 4.5–10.5)

## 2011-10-13 LAB — BASIC METABOLIC PANEL
GFR: 35.24 mL/min — ABNORMAL LOW (ref >60.00)
Potassium: 5.3 mEq/L — ABNORMAL HIGH (ref 3.5–5.1)
Sodium: 138 mEq/L (ref 135–145)

## 2011-10-13 LAB — SEDIMENTATION RATE: Sed Rate: 49 mm/hr — ABNORMAL HIGH (ref 0–22)

## 2011-10-13 MED ORDER — DULOXETINE HCL 30 MG PO CPEP: 30.0000 mg | ORAL_CAPSULE | Freq: Every day | ORAL | Status: DC

## 2011-10-13 NOTE — Patient Instructions (Addendum)
Take all new medications as prescribed - the cymbalta at 30 mg per day Continue all other medications as before Please go to LAB in the Basement for the blood and/or urine tests to be done today You will be contacted by phone if any changes need to be made immediately.  Otherwise, you will receive a letter about your results with an explanation. Please see Dr Eulah Pont for your knee and shoulder pain if not improved You will be contacted regarding the referral for: Physical Therapy Please return in 6 months, or sooner if needed

## 2011-10-13 NOTE — Progress Notes (Signed)
Subjective:    Patient ID: Beth Gutierrez, female    DOB: Aug 16, 1921, 78 y.o.   MRN: [id number]  HPI  Here to f/u; c/o pain "all over" specifically knees and shoulders, is s/p cortisone per ortho (Dr Eulah Pont) to right knee, last seen months ago, now left knee acting the same with pain and swelling, walks with walker, and "tired of not feeling good."  Has bilat shoulder pain as well, not eval for this, overall improved wiht pain with alleve prn, does not want stronger meds.  Feels angry and frustrated over worsening general function, fear of falling.  No recent falls.  Live alone, 1 floor/no stairs.  Has not tried cymbalta. + mild depressive symtpoms.   Pt denies chest pain, increased sob or doe, wheezing, orthopnea, PND, increased LE swelling, palpitations, dizziness or syncope.  Pt denies new neurological symptoms such as new headache, or facial or extremity weakness or numbness   Pt denies polydipsia, polyuria. Past Medical History  Diagnosis Date  . Acute myocardial infarction, unspecified site, episode of care unspecified   . Congestive heart failure, unspecified   . Coronary atherosclerosis of unspecified type of vessel, native or graft   . Unspecified essential hypertension   . Other and unspecified hyperlipidemia   . Esophageal reflux   . Depressive disorder, not elsewhere classified   . Morbid obesity   . Need for prophylactic vaccination and inoculation against influenza   . Allergic rhinitis, cause unspecified   . Other malaise and fatigue   . Disorder of bone and cartilage, unspecified   . Diverticulosis of colon (without mention of hemorrhage)   . Benign neoplasm of colon   . Gout, unspecified   . Unspecified glaucoma   . Osteoarthrosis, unspecified whether generalized or localized, unspecified site   . Gout flare 02/06/2011  . Anemia, unspecified 04/05/2011   Past Surgical History  Procedure Date  . Coronary angioplasty with stent placement     RCA stent  .  Vesicovaginal fistula closure w/ tah   . Left hip replacement     s/p 2002. Secondary to DJD  . Ectopic pregnancy surgery     reports that she quit smoking about 21 years ago. She has never used smokeless tobacco. She reports that she does not drink alcohol or use illicit drugs. family history includes Breast cancer in her sister; Hypertension in her father; and Stroke in her father. Allergies  Allergen Reactions  . Sulfonamide Derivatives Other (See Comments)    unknown   Current Outpatient Prescriptions on File Prior to Visit  Medication Sig Dispense Refill  . allopurinol (ZYLOPRIM) 100 MG tablet Take 1 tablet (100 mg total) by mouth daily.  90 tablet  3  . aspirin (ECOTRIN LOW STRENGTH) 81 MG EC tablet Take 81 mg by mouth daily.        . brimonidine (ALPHAGAN P) 0.1 % SOLN Place 1 drop into both eyes 2 (two) times daily.        . ramipril (ALTACE) 10 MG capsule TAKE 1 CAPSULE TWICE DAILY  60 capsule  9  . ranitidine (ZANTAC) 150 MG tablet Take 150 mg by mouth as needed. Acid reflux      . simvastatin (ZOCOR) 40 MG tablet TAKE 1 TABLET BY MOUTH EVERY DAY  30 tablet  1  . triamterene-hydrochlorothiazide (MAXZIDE-25) 37.5-25 MG per tablet TAKE 1 TABLET BY MOUTH ONCE A DAY  30 tablet  2  . DULoxetine (CYMBALTA) 30 MG capsule Take 1 capsule (30 mg total)  by mouth daily.  30 capsule  11   Review of Systems Constitutional: Negative for diaphoresis and unexpected weight change.  HENT: Negative for drooling and tinnitus.   Eyes: Negative for photophobia and visual disturbance.  Respiratory: Negative for choking and cough Gastrointestinal: Negative for vomiting and blood in stool.  Genitourinary: Negative for hematuria and decreased urine volume.  Musculoskeletal: Negative for recurrent falls Skin: Negative for color change and wound.  Neurological: Negative for tremors and numbness.     Objective:   Physical Exam BP 122/64  Pulse 80  Temp 97.6 F (36.4 C) (Oral)  Ht 5\' 3"  (1.6 m)   Wt 225 lb (102.059 kg)  BMI 39.86 kg/m2  SpO2 94% Physical Exam  VS noted, walking with walker,  Constitutional: Pt appears well-developed and well-nourished./obese  HENT: Head: Normocephalic.  Right Ear: External ear normal.  Left Ear: External ear normal.  Eyes: Conjunctivae and EOM are normal. Pupils are equal, round, and reactive to light.  Neck: Normal range of motion. Neck supple.  Cardiovascular: Normal rate and regular rhythm.   Pulmonary/Chest: Effort normal and breath sounds normal.  Abd:  Soft, NT, non-distended, + BS Neurological: Pt is alert. Not confused.  Skin: Skin is warm. No erythema.  No rash Bilat shoulder with diffuse tender, decreased ROM Psychiatric: Pt behavior is normal. Thought content normal. + depressed affect    Assessment & Plan:

## 2011-10-14 ENCOUNTER — Encounter: Payer: Self-pay | Admitting: Internal Medicine

## 2011-10-14 NOTE — Assessment & Plan Note (Signed)
For esr, cant r/o pmr, supect more likely related to walker use, for tylenol prn, declines ortho or rheum eval at this time

## 2011-10-14 NOTE — Assessment & Plan Note (Signed)
With signficant difficulty getting up from seated today and walking with walker - for home PT for eval and tx

## 2011-10-14 NOTE — Assessment & Plan Note (Signed)
stable overall by hx and exam, most recent data reviewed with pt, and pt to continue medical treatment as before Lab Results  Component Value Date   CREATININE 1.8* 10/13/2011

## 2011-10-14 NOTE — Assessment & Plan Note (Signed)
stable overall by hx and exam, most recent data reviewed with pt, and pt to continue medical treatment as before BP Readings from Last 3 Encounters:  10/13/11 122/64  10/12/11 130/60  04/05/11 122/70

## 2011-10-17 ENCOUNTER — Encounter: Payer: Self-pay | Admitting: Internal Medicine

## 2011-11-13 ENCOUNTER — Other Ambulatory Visit: Payer: Self-pay | Admitting: Cardiology

## 2011-12-10 ENCOUNTER — Other Ambulatory Visit: Payer: Self-pay | Admitting: Cardiology

## 2012-02-12 ENCOUNTER — Other Ambulatory Visit: Payer: Self-pay

## 2012-02-12 DIAGNOSIS — M109 Gout, unspecified: Secondary | ICD-10-CM

## 2012-02-12 MED ORDER — ALLOPURINOL 100 MG PO TABS: 100.0000 mg | ORAL_TABLET | Freq: Every day | ORAL | Status: DC

## 2012-02-26 ENCOUNTER — Other Ambulatory Visit: Payer: Self-pay | Admitting: Internal Medicine

## 2012-05-28 ENCOUNTER — Encounter (HOSPITAL_COMMUNITY): Payer: Self-pay | Admitting: Nurse Practitioner

## 2012-05-28 ENCOUNTER — Emergency Department (HOSPITAL_COMMUNITY): Payer: Medicare Other

## 2012-05-28 ENCOUNTER — Emergency Department (HOSPITAL_COMMUNITY)
Admission: EM | Admit: 2012-05-28 | Discharge: 2012-05-28 | Disposition: A | Discharge status: Nursing Home (Includes ICF) | Payer: Medicare Other | Attending: Emergency Medicine | Admitting: Emergency Medicine

## 2012-05-28 ENCOUNTER — Other Ambulatory Visit: Payer: Self-pay | Admitting: Internal Medicine

## 2012-05-28 DIAGNOSIS — Z9861 Coronary angioplasty status: Secondary | ICD-10-CM | POA: Insufficient documentation

## 2012-05-28 DIAGNOSIS — F329 Major depressive disorder, single episode, unspecified: Secondary | ICD-10-CM | POA: Insufficient documentation

## 2012-05-28 DIAGNOSIS — Z8669 Personal history of other diseases of the nervous system and sense organs: Secondary | ICD-10-CM | POA: Insufficient documentation

## 2012-05-28 DIAGNOSIS — R531 Weakness: Secondary | ICD-10-CM

## 2012-05-28 DIAGNOSIS — R110 Nausea: Secondary | ICD-10-CM | POA: Insufficient documentation

## 2012-05-28 DIAGNOSIS — I252 Old myocardial infarction: Secondary | ICD-10-CM | POA: Insufficient documentation

## 2012-05-28 DIAGNOSIS — I10 Essential (primary) hypertension: Secondary | ICD-10-CM | POA: Insufficient documentation

## 2012-05-28 DIAGNOSIS — K219 Gastro-esophageal reflux disease without esophagitis: Secondary | ICD-10-CM | POA: Insufficient documentation

## 2012-05-28 DIAGNOSIS — E785 Hyperlipidemia, unspecified: Secondary | ICD-10-CM | POA: Insufficient documentation

## 2012-05-28 DIAGNOSIS — Z7982 Long term (current) use of aspirin: Secondary | ICD-10-CM | POA: Insufficient documentation

## 2012-05-28 DIAGNOSIS — R52 Pain, unspecified: Secondary | ICD-10-CM | POA: Insufficient documentation

## 2012-05-28 DIAGNOSIS — Z862 Personal history of diseases of the blood and blood-forming organs and certain disorders involving the immune mechanism: Secondary | ICD-10-CM | POA: Insufficient documentation

## 2012-05-28 DIAGNOSIS — I2510 Atherosclerotic heart disease of native coronary artery without angina pectoris: Secondary | ICD-10-CM | POA: Insufficient documentation

## 2012-05-28 DIAGNOSIS — R5381 Other malaise: Secondary | ICD-10-CM | POA: Insufficient documentation

## 2012-05-28 DIAGNOSIS — F3289 Other specified depressive episodes: Secondary | ICD-10-CM | POA: Insufficient documentation

## 2012-05-28 DIAGNOSIS — Z8719 Personal history of other diseases of the digestive system: Secondary | ICD-10-CM | POA: Insufficient documentation

## 2012-05-28 DIAGNOSIS — R5383 Other fatigue: Secondary | ICD-10-CM | POA: Insufficient documentation

## 2012-05-28 DIAGNOSIS — M109 Gout, unspecified: Secondary | ICD-10-CM | POA: Insufficient documentation

## 2012-05-28 DIAGNOSIS — Z87891 Personal history of nicotine dependence: Secondary | ICD-10-CM | POA: Insufficient documentation

## 2012-05-28 DIAGNOSIS — I509 Heart failure, unspecified: Secondary | ICD-10-CM | POA: Insufficient documentation

## 2012-05-28 DIAGNOSIS — Z8739 Personal history of other diseases of the musculoskeletal system and connective tissue: Secondary | ICD-10-CM | POA: Insufficient documentation

## 2012-05-28 DIAGNOSIS — Z23 Encounter for immunization: Secondary | ICD-10-CM | POA: Insufficient documentation

## 2012-05-28 DIAGNOSIS — Z79899 Other long term (current) drug therapy: Secondary | ICD-10-CM | POA: Insufficient documentation

## 2012-05-28 LAB — CBC WITH DIFFERENTIAL/PLATELET
Basophils Relative: 0 % (ref 0–1)
Eosinophils Absolute: 0.1 10*3/uL (ref 0.0–0.7)
Hemoglobin: 10.2 g/dL — ABNORMAL LOW (ref 12.0–15.0)
MCH: 30.9 pg (ref 26.0–34.0)
MCHC: 32 g/dL (ref 30.0–36.0)
Monocytes Absolute: 0.4 10*3/uL (ref 0.1–1.0)
Monocytes Relative: 6 % (ref 3–12)
Neutrophils Relative %: 76 % (ref 43–77)
RDW: 15.4 % (ref 11.5–15.5)

## 2012-05-28 LAB — COMPREHENSIVE METABOLIC PANEL
Albumin: 3.5 g/dL (ref 3.5–5.2)
BUN: 26 mg/dL — ABNORMAL HIGH (ref 6–23)
Creatinine, Ser: 1.48 mg/dL — ABNORMAL HIGH (ref 0.50–1.10)
Potassium: 4.5 mEq/L (ref 3.5–5.1)
Total Protein: 6.5 g/dL (ref 6.0–8.3)

## 2012-05-28 LAB — URINALYSIS, ROUTINE W REFLEX MICROSCOPIC
Leukocytes, UA: NEGATIVE
Nitrite: NEGATIVE
Specific Gravity, Urine: 1.015 (ref 1.005–1.030)
pH: 6 (ref 5.0–8.0)

## 2012-05-28 LAB — LIPASE, BLOOD: Lipase: 21 U/L (ref 11–59)

## 2012-05-28 MED ORDER — MORPHINE SULFATE 4 MG/ML IJ SOLN
4.0000 mg | Freq: Once | INTRAMUSCULAR | Status: AC
  Administered 2012-05-28: 4 mg via INTRAVENOUS
  Filled 2012-05-28: qty 1

## 2012-05-28 MED ORDER — SODIUM CHLORIDE 0.9 % IV BOLUS (SEPSIS)
500.0000 mL | Freq: Once | INTRAVENOUS | Status: AC
  Administered 2012-05-28: 500 mL via INTRAVENOUS

## 2012-05-28 MED ORDER — DULOXETINE HCL 30 MG PO CPEP: 30.0000 mg | ORAL_CAPSULE | Freq: Every day | ORAL | Status: DC

## 2012-05-28 MED ORDER — ALLOPURINOL 100 MG PO TABS: 100.0000 mg | ORAL_TABLET | Freq: Every day | ORAL | Status: DC

## 2012-05-28 MED ORDER — ONDANSETRON HCL 4 MG/2ML IJ SOLN
4.0000 mg | Freq: Once | INTRAMUSCULAR | Status: AC
  Administered 2012-05-28: 4 mg via INTRAVENOUS
  Filled 2012-05-28: qty 2

## 2012-05-28 MED ORDER — SIMVASTATIN 40 MG PO TABS: 40.0000 mg | ORAL_TABLET | Freq: Every day | ORAL | Status: DC

## 2012-05-28 MED ORDER — TRIAMTERENE-HCTZ 37.5-25 MG PO TABS: 1.0000 | ORAL_TABLET | Freq: Every day | ORAL | Status: DC

## 2012-05-28 MED ORDER — SODIUM CHLORIDE 0.9 % IV SOLN
Freq: Once | INTRAVENOUS | Status: AC
  Administered 2012-05-28: 11:00:00 via INTRAVENOUS

## 2012-05-28 MED ORDER — ONDANSETRON 8 MG PO TBDP: 8.0000 mg | ORAL_TABLET | Freq: Three times a day (TID) | ORAL | Status: DC | PRN

## 2012-05-28 NOTE — ED Notes (Signed)
Per EMS: pt c/o nausea and bilateral arm pain x 2 days.  Pt is a resident at [facility].  Pt had 12-Lead EKG performed en route- PVC noted.

## 2012-05-28 NOTE — Telephone Encounter (Signed)
Called the caregiver informed to pickup hardcopy's of requested prescriptions at the front desk.

## 2012-05-28 NOTE — ED Notes (Signed)
Patient transported to X-ray (estimated time)

## 2012-05-28 NOTE — ED Notes (Signed)
Two attempts at obtaining urine sample. Patient states unable to provide. RN aware.

## 2012-05-28 NOTE — ED Notes (Signed)
Pt discharged by Jeanene Erb, AD

## 2012-05-28 NOTE — Telephone Encounter (Signed)
[facility] Triage Call Report Triage Record Num: [id number] Operator: Meribeth Mattes Patient Name: Beth Gutierrez Call Date & Time: 05/28/2012 7:38:22AM Patient Phone: [phone] PCP: Oliver Barre Patient Gender: Female PCP Fax : [phone] Patient DOB: [birth year] [facility] Name: [facility] Reason for Call: Caller: Blanche/Other; PCP: Oliver Barre (Adults only); CB#: [phone]; Call regarding L arm pain, onset 05/28/12, nausea and abd'l pain, onset 05/27/12, but this am worse with aches all over and specifically L arm Triage per Arm Non-Injury Protocol. EMS 911 disposition. Friend voiced understanding. Protocol(s) Used: Arm Non-Injury Recommended Outcome per Protocol: Activate EMS 911 Reason for Outcome: New or worsening arm pain AND any cardiac signs/symptoms lasting for more than 5 minutes now or within last hour. Pain is NOT associated with taking a deep breath or a productive cough, movement, or touch to a localized area on the chest.

## 2012-05-28 NOTE — Telephone Encounter (Signed)
[facility] Triage Call Report Triage Record Num: [id number] Operator: Thayer Headings Patient Name: Beth Gutierrez Call Date & Time: 05/27/2012 6:10:02PM Patient Phone: [phone] PCP: Oliver Barre Patient Gender: Female PCP Fax : [phone] Patient DOB: 1921-12-02 [facility] Name[facility] Reason for Call: Caller: Blanche/Friend; PCP: Oliver Barre (Adults only); CB#: [phone]; Calling today 05/27/12 regarding pt having nausea/abdominal pain. Belching a lot. Onset this AM. No vomiting, just very nauseated. Afebrile. No diarrhea. Emergent symptoms r/o by Abdominal Pain guidelines with exception of abdominal pain that has steadily worsened over hours OR has been continuous for 3 hours or more AND any of the following: loss of appetite, vomiting starting after pain, any fever, OR unable to carry out normal activities. (See ED Immediately). Care advice given. Adviced pt to [facility] for evaluation. Protocol(s) Used: Abdominal Pain Recommended Outcome per Protocol: See ED Immediately Reason for Outcome: Abdominal pain that has steadily worsened over hours OR has been continuous for 3 hours or more AND any of the following: loss of appetite, vomiting starting after pain, any fever, OR unable to carry out normal activities Care Advice: ~ Another adult should drive. ~ Pain medication or laxatives should not be taken until symptoms are evaluated. ~ Do not eat or drink anything until evaluated by provider. ~ IMMEDIATE ACTION 04/

## 2012-05-28 NOTE — ED Provider Notes (Signed)
History     CSN: [id number]  Arrival date & time 05/28/12  4098   First MD Initiated Contact with Patient 05/28/12 [phone]      Chief Complaint  Patient presents with  . Generalized Body Aches  . Nausea    (Consider location/radiation/quality/duration/timing/severity/associated sxs/prior treatment) HPI  Patient reports yesterday she started feeling bad. She states she has "no get up". She states she's had nausea and her caretaker states she's had a lot of belching. Patient denies any burning discomfort in her throat. She has had decreased appetite and upper abdominal pain that she describes as dull and cramping. She states she continues to have nausea today. She denies vomiting or diarrhea. She states her bowel movements have been hard. She states she feels weak and dizzy and lightheaded. She denies chest pain, cough, shortness of breath, urinary symptoms except for frequency which she's had for the past 2 weeks. Patient recently moved from living at home to independent living apartment. Her caretaker states the patient always felt like there was someone in the house with her and she needed more help. She also reports she's been having problems with her son who refuses to sell her house. Patient states she feels depressed but she denies suicidal ideation. She states she has non-specific plan to harm her son however her caregiver quickly points out she does not see her son often. Patient states she does not need to be admitted to a psychiatric hospital.  PCP Dr. Sherrie Sport [facility]  Past Medical History  Diagnosis Date  . Acute myocardial infarction, unspecified site, episode of care unspecified   . Congestive heart failure, unspecified   . Coronary atherosclerosis of unspecified type of vessel, native or graft   . Unspecified essential hypertension   . Other and unspecified hyperlipidemia   . Esophageal reflux   . Depressive disorder, not elsewhere classified   . Morbid  obesity   . Need for prophylactic vaccination and inoculation against influenza   . Allergic rhinitis, cause unspecified   . Other malaise and fatigue   . Disorder of bone and cartilage, unspecified   . Diverticulosis of colon (without mention of hemorrhage)   . Benign neoplasm of colon   . Gout, unspecified   . Unspecified glaucoma(365.9)   . Osteoarthrosis, unspecified whether generalized or localized, unspecified site   . Gout flare 02/06/2011  . Anemia, unspecified 04/05/2011    Past Surgical History  Procedure Laterality Date  . Coronary angioplasty with stent placement      RCA stent  . Vesicovaginal fistula closure w/ tah    . Left hip replacement      s/p 2002. Secondary to DJD  . Ectopic pregnancy surgery      Family History  Problem Relation Age of Onset  . Breast cancer Sister   . Hypertension Father   . Stroke Father     History  Substance Use Topics  . Smoking status: Former Smoker    Quit date: 05/30/1990  . Smokeless tobacco: Never Used  . Alcohol Use: No   recently moved into independent living apartment Uses a walker  OB History   Grav Para Term Preterm Abortions TAB SAB Ect Mult Living                  Review of Systems  All other systems reviewed and are negative.    Allergies  Sulfonamide derivatives  Home Medications   Current Outpatient Rx  Name  Route  Sig  Dispense  Refill  . allopurinol (ZYLOPRIM) 100 MG tablet   Oral   Take 1 tablet (100 mg total) by mouth daily.   90 tablet   3   . aspirin (ECOTRIN LOW STRENGTH) 81 MG EC tablet   Oral   Take 81 mg by mouth daily.           . brimonidine (ALPHAGAN P) 0.1 % SOLN   Both Eyes   Place 1 drop into both eyes 2 (two) times daily.           . DULoxetine (CYMBALTA) 30 MG capsule   Oral   Take 1 capsule (30 mg total) by mouth daily.   30 capsule   11   . ramipril (ALTACE) 10 MG capsule      TAKE 1 CAPSULE TWICE DAILY   60 capsule   8   . ranitidine (ZANTAC) 150 MG  tablet   Oral   Take 150 mg by mouth as needed. Acid reflux         . simvastatin (ZOCOR) 40 MG tablet      TAKE 1 TABLET BY MOUTH EVERY DAY   30 tablet   5   . triamterene-hydrochlorothiazide (MAXZIDE-25) 37.5-25 MG per tablet      TAKE 1 TABLET BY MOUTH ONCE A DAY   30 tablet   11     BP 133/48  Pulse 74  Temp(Src) 98.4 F (36.9 C) (Oral)  Resp 15  SpO2 94%  Vital signs normal    Physical Exam  Nursing note and vitals reviewed. Constitutional: She is oriented to person, place, and time. She appears well-developed and well-nourished.  Non-toxic appearance. She does not appear ill. No distress.  HENT:  Head: Normocephalic and atraumatic.  Right Ear: External ear normal.  Left Ear: External ear normal.  Nose: Nose normal. No mucosal edema or rhinorrhea.  Mouth/Throat: Mucous membranes are normal. No dental abscesses or edematous.  Dry tongue  Eyes: Conjunctivae and EOM are normal. Pupils are equal, round, and reactive to light.  Neck: Normal range of motion and full passive range of motion without pain. Neck supple.  Cardiovascular: Normal rate, regular rhythm and normal heart sounds.  Exam reveals no gallop and no friction rub.   No murmur heard. Pulmonary/Chest: Effort normal and breath sounds normal. No respiratory distress. She has no wheezes. She has no rhonchi. She has no rales. She exhibits no tenderness and no crepitus.  Abdominal: Soft. Normal appearance and bowel sounds are normal. She exhibits no distension. There is tenderness. There is no rebound and no guarding.    Tender in the upper abdomen that seems to be most painful in the epigastric area  Musculoskeletal: Normal range of motion. She exhibits no edema and no tenderness.  Moves all extremities well.   Neurological: She is alert and oriented to person, place, and time. She has normal strength. No cranial nerve deficit.  Skin: Skin is warm, dry and intact. No rash noted. No erythema. No pallor.    Psychiatric: She has a normal mood and affect. Her speech is normal and behavior is normal. Her mood appears not anxious.    ED Course  Procedures (including critical care time)   Medications  sodium chloride 0.9 % bolus 500 mL (0 mLs Intravenous Stopped 05/28/12 1053)  0.9 %  sodium chloride infusion ( Intravenous New Bag/Given 05/28/12 1054)  ondansetron (ZOFRAN) injection 4 mg (4 mg Intravenous Given 05/28/12 1023)  morphine 4 MG/ML injection 4 mg (4 mg  Intravenous Given 05/28/12 1025)  sodium chloride 0.9 % bolus 500 mL (0 mLs Intravenous Stopped 05/28/12 1213)   Recheck 11:50 feeling better, nausea gone, abdominal pain gone, ready to try drinking fluids.   At discharge pt has been able to drink fluids and states she still feels fine and ready to go home.   Dg Abd Acute W/chest  05/28/2012  *RADIOLOGY REPORT*  Clinical Data: Epigastric pain with nausea and constipation.  ACUTE ABDOMEN SERIES (ABDOMEN 2 VIEW & CHEST 1 VIEW)  Comparison: Chest radiographs 01/29/2011.  Findings: There is stable cardiomegaly.  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  There is no pleural effusion.  Advanced glenohumeral degenerative changes are present bilaterally.  The bowel gas pattern is nonobstructive.  There is no free intraperitoneal air.  Right pelvic calcifications are likely phleboliths.  There are degenerative changes throughout the lumbar spine and right hip.  Patient is status post left total hip arthroplasty.  IMPRESSION:  1.  No acute cardiopulmonary or abdominal process identified. 2.  Cardiomegaly with chronic bibasilar atelectasis.   Original Report Authenticated By: Carey Bullocks, M.D.      1. Nausea   2. Weakness    New Prescriptions   ONDANSETRON (ZOFRAN ODT) 8 MG DISINTEGRATING TABLET    Take 1 tablet (8 mg total) by mouth every 8 (eight) hours as needed for nausea.    Plan discharge  Devoria Albe, MD,  FACEP     MDM          Ward Givens, MD 05/28/12 1325

## 2012-05-28 NOTE — ED Notes (Signed)
Offered water per fluid challenge order: pt tolerating well as of now.  Will monitor for nausea

## 2012-05-28 NOTE — Telephone Encounter (Signed)
Caretaker called requesting to pick up hard copy Rxs of the above pending medications. Please call her directly once available for pick up.

## 2012-05-29 ENCOUNTER — Telehealth: Payer: Self-pay | Admitting: Internal Medicine

## 2012-05-29 NOTE — Telephone Encounter (Signed)
Patients caregiver informed of MD instructions.

## 2012-05-29 NOTE — Telephone Encounter (Signed)
Pt has an appt Monday for a post hosp.  She still has the nausea.  She is not eating much at all or drinking.  She has had a cup of Sprite today.   Should she be given Gatorade or something like that to keep her being dehydrated.

## 2012-05-29 NOTE — Telephone Encounter (Signed)
A BRAT type diet (banana, rice, applesuace, toast) and clear liquids (sprite or gatorade or similar) would be fine

## 2012-06-03 ENCOUNTER — Ambulatory Visit (INDEPENDENT_AMBULATORY_CARE_PROVIDER_SITE_OTHER): Payer: Medicare Other | Admitting: Internal Medicine

## 2012-06-03 ENCOUNTER — Ambulatory Visit (INDEPENDENT_AMBULATORY_CARE_PROVIDER_SITE_OTHER)
Admission: RE | Admit: 2012-06-03 | Discharge: 2012-06-03 | Disposition: A | Discharge status: Home / Self Care | Payer: Medicare Other | Source: Ambulatory Visit | Attending: Internal Medicine | Admitting: Internal Medicine

## 2012-06-03 ENCOUNTER — Other Ambulatory Visit: Payer: Self-pay | Admitting: Internal Medicine

## 2012-06-03 ENCOUNTER — Encounter: Payer: Self-pay | Admitting: Internal Medicine

## 2012-06-03 ENCOUNTER — Other Ambulatory Visit (INDEPENDENT_AMBULATORY_CARE_PROVIDER_SITE_OTHER): Payer: Medicare Other

## 2012-06-03 ENCOUNTER — Encounter: Payer: Self-pay | Admitting: *Deleted

## 2012-06-03 VITALS — BP 110/62 | HR 86 | Temp 97.5°F | Wt 202.0 lb

## 2012-06-03 DIAGNOSIS — R112 Nausea with vomiting, unspecified: Secondary | ICD-10-CM

## 2012-06-03 DIAGNOSIS — R1013 Epigastric pain: Secondary | ICD-10-CM

## 2012-06-03 DIAGNOSIS — I10 Essential (primary) hypertension: Secondary | ICD-10-CM

## 2012-06-03 DIAGNOSIS — F329 Major depressive disorder, single episode, unspecified: Secondary | ICD-10-CM

## 2012-06-03 LAB — SEDIMENTATION RATE: Sed Rate: 31 mm/hr — ABNORMAL HIGH (ref 0–22)

## 2012-06-03 LAB — CBC WITH DIFFERENTIAL/PLATELET
Basophils Relative: 0.5 % (ref 0.0–3.0)
Eosinophils Absolute: 0.1 10*3/uL (ref 0.0–0.7)
Lymphs Abs: 1 10*3/uL (ref 0.7–4.0)
MCHC: 33.1 g/dL (ref 30.0–36.0)
MCV: 95.6 fl (ref 78.0–100.0)
Monocytes Absolute: 0.6 10*3/uL (ref 0.1–1.0)
Neutrophils Relative %: 82.1 % — ABNORMAL HIGH (ref 43.0–77.0)
Platelets: 324 10*3/uL (ref 150.0–400.0)
RBC: 3.36 Mil/uL — ABNORMAL LOW (ref 3.87–5.11)

## 2012-06-03 LAB — URINALYSIS, ROUTINE W REFLEX MICROSCOPIC
Bilirubin Urine: NEGATIVE
Ketones, ur: NEGATIVE
Total Protein, Urine: 30
Urine Glucose: NEGATIVE
pH: 6 (ref 5.0–8.0)

## 2012-06-03 LAB — HEPATIC FUNCTION PANEL
ALT: 9 U/L (ref 0–35)
AST: 13 U/L (ref 0–37)
Bilirubin, Direct: 0 mg/dL (ref 0.0–0.3)
Total Bilirubin: 0.4 mg/dL (ref 0.3–1.2)

## 2012-06-03 LAB — BASIC METABOLIC PANEL
Chloride: 102 mEq/L (ref 96–112)
Potassium: 4.7 mEq/L (ref 3.5–5.1)

## 2012-06-03 MED ORDER — RAMIPRIL 10 MG PO CAPS: 10.0000 mg | ORAL_CAPSULE | Freq: Two times a day (BID) | ORAL | Status: DC

## 2012-06-03 MED ORDER — ALLOPURINOL 100 MG PO TABS: 100.0000 mg | ORAL_TABLET | Freq: Every day | ORAL | Status: DC

## 2012-06-03 MED ORDER — TRIAMTERENE-HCTZ 37.5-25 MG PO TABS: 1.0000 | ORAL_TABLET | Freq: Every day | ORAL | Status: DC

## 2012-06-03 MED ORDER — RANITIDINE HCL 150 MG PO TABS: 150.0000 mg | ORAL_TABLET | ORAL | Status: DC | PRN

## 2012-06-03 MED ORDER — CEPHALEXIN 500 MG PO CAPS: 500.0000 mg | ORAL_CAPSULE | Freq: Four times a day (QID) | ORAL | Status: DC

## 2012-06-03 MED ORDER — ONDANSETRON 8 MG PO TBDP: 8.0000 mg | ORAL_TABLET | Freq: Three times a day (TID) | ORAL | Status: DC | PRN

## 2012-06-03 MED ORDER — DULOXETINE HCL 30 MG PO CPEP: 30.0000 mg | ORAL_CAPSULE | Freq: Every day | ORAL | Status: DC

## 2012-06-03 NOTE — Assessment & Plan Note (Signed)
For f/u films and labs, hold diuretic for now with decreased po intake, d/c zocor, home health for home enema for probable constipation

## 2012-06-03 NOTE — Patient Instructions (Signed)
OK to stop the simvastatin (cholesterol pill) Please HOLD on taking the fluid pill for now (the triamterene/HCTZ) You are given the refill of the nausea medication to the pharmacy ([facility]) Please continue all other medications as before, and refills have been done if requested - to [facility] Please try to drink more fluids in the next few days if possible You will be contacted regarding the referral for: Home Health for the Home Enema Please go to the XRAY Department in the Basement (go straight as you get off the elevator) for the x-ray testing Please go to the LAB in the Basement (turn left off the elevator) for the tests to be done today You will be contacted by phone if any changes need to be made immediately.  Otherwise, you will receive a letter about your results with an explanation Please remember to sign up for My Chart if you have not done so, as this will be important to you in the future with finding out test results, communicating by private email, and scheduling acute appointments online when needed. Please return in 2 weeks, or sooner if needed

## 2012-06-03 NOTE — Assessment & Plan Note (Signed)
stable overall by history and exam, recent data reviewed with pt, and pt to continue medical treatment as before,  to f/u any worsening symptoms or concerns BP Readings from Last 3 Encounters:  06/03/12 110/62  05/28/12 133/48  10/13/11 122/64

## 2012-06-03 NOTE — Assessment & Plan Note (Signed)
With recent stressors, cont duloxetine, declines counseling, verified nonsuicidal

## 2012-06-03 NOTE — Progress Notes (Signed)
Subjective:    Patient ID: Beth Gutierrez, female    DOB: [birth year], 79 y.o.   MRN: [id number]  HPI  Here with good family friend, who is not POA, but has helped her get moved recently to [place] living, seen in ER apr 15, acute abd films neg but zofran has run out and still with persistent n/v, decreased po intake, belching, upper abd discomfort and constipation. Denies worsening reflux, dysphagia, or blood.  Denies worsening depressive symptoms, suicidal ideation, or panic; has ongoing anxiety,and increased stress over a 2 sons who have only seen her once since moving to the new living place, one of whom friend says also is "not very nice."  Overall good compliance with treatment, and good medicine tolerability.  Pt denies chest pain, increased sob or doe, wheezing, orthopnea, PND, increased LE swelling, palpitations, dizziness or syncope, no hx of aspiration Past Medical History  Diagnosis Date  . Acute myocardial infarction, unspecified site, episode of care unspecified   . Congestive heart failure, unspecified   . Coronary atherosclerosis of unspecified type of vessel, native or graft   . Unspecified essential hypertension   . Other and unspecified hyperlipidemia   . Esophageal reflux   . Depressive disorder, not elsewhere classified   . Morbid obesity   . Need for prophylactic vaccination and inoculation against influenza   . Allergic rhinitis, cause unspecified   . Other malaise and fatigue   . Disorder of bone and cartilage, unspecified   . Diverticulosis of colon (without mention of hemorrhage)   . Benign neoplasm of colon   . Gout, unspecified   . Unspecified glaucoma(365.9)   . Osteoarthrosis, unspecified whether generalized or localized, unspecified site   . Gout flare 02/06/2011  . Anemia, unspecified 04/05/2011   Past Surgical History  Procedure Laterality Date  . Coronary angioplasty with stent placement      RCA stent  . Vesicovaginal fistula closure w/ tah     . Left hip replacement      s/p 2002. Secondary to DJD  . Ectopic pregnancy surgery      reports that she quit smoking about 22 years ago. She has never used smokeless tobacco. She reports that she does not drink alcohol or use illicit drugs. family history includes Breast cancer in her sister; Hypertension in her father; and Stroke in her father. Allergies  Allergen Reactions  . Sulfonamide Derivatives Other (See Comments)    unknown   Current Outpatient Prescriptions on File Prior to Visit  Medication Sig Dispense Refill  . aspirin (ECOTRIN LOW STRENGTH) 81 MG EC tablet Take 81 mg by mouth daily.        . brimonidine (ALPHAGAN P) 0.1 % SOLN Place 1 drop into both eyes 2 (two) times daily.         No current facility-administered medications on file prior to visit.   Review of Systems .mirnos     Objective:   Physical Exam BP 110/62  Pulse 86  Temp(Src) 97.5 F (36.4 C) (Oral)  Wt 202 lb (91.627 kg)  BMI 35.79 kg/m2  SpO2 93% VS noted,  Constitutional: Pt appears ill with n/v in exam room HENT: Head: NCAT.  Right Ear: External ear normal.  Left Ear: External ear normal.  Eyes: Conjunctivae and EOM are normal. Pupils are equal, round, and reactive to light.  Neck: Normal range of motion. Neck supple.  Cardiovascular: Normal rate and regular rhythm.   Pulmonary/Chest: Effort normal and breath sounds normal.  Abd:  Soft, non-distended, + BS with diffuse mild tenderness Neurological: Pt is alert. Skin: Skin is warm. No erythema.  Psychiatric: Pt behavior is normal.     Assessment & Plan:

## 2012-06-03 NOTE — Assessment & Plan Note (Signed)
For refill zofran

## 2012-06-04 ENCOUNTER — Other Ambulatory Visit: Payer: Self-pay

## 2012-06-04 MED ORDER — CEPHALEXIN 500 MG PO CAPS: 500.0000 mg | ORAL_CAPSULE | Freq: Four times a day (QID) | ORAL | Status: DC

## 2012-06-05 ENCOUNTER — Telehealth: Payer: Self-pay | Admitting: Internal Medicine

## 2012-06-05 NOTE — Telephone Encounter (Signed)
Ok to let pt's good friend with her at her last OV (note: who is not POA however), that ok for Home Fleets enema x 1 as Home health unable to provide service

## 2012-06-05 NOTE — Telephone Encounter (Signed)
Patient Information:  Caller Name: [name]  Phone: [phone]  Patient: Beth Gutierrez, Beth Gutierrez  Gender: Female  DOB: 1921/09/10  Age: 79 Years  PCP: Oliver Barre (Adults only)  Office Follow Up:  Does the office need to follow up with this patient?: Yes  Instructions For The Office: PLEASE RETURN CALL TO CAREGIVER/PATIENT AT [phone] REGARDING RECOMMENDATION FOR PATIENT'S CONSTIPATION OR HOME CARE ASSISTANCE.  RN Note:  Care giver states patient has not had a bowel movement since 05/26/12. Caller states patient has been given Miralax without improvement. States patient has complained of intermittent abdominal discomfort and nausea since 05/27/12. States patient was evaluated in the ED on 05/27/12. Care advice given per guidelines. Call back parameters reviewed. Caregiver verbalizes understanding. Caregiver states she is unable to give patient a laxative or an enema at home due to patient's lack of mobility. Caregiver is requesting that a Home Health Nurse be sent to home to receive an enema or laxative. RN reviewed [facility] Health Record. Documentation noted: "Dr Jonny Ruiz, was informed by Lynden Ang at [facility] that no insurance will pay for home enema." Caregiver declines an appt.  Caregiver is also verbalizing concern that during Patient's office visit with Dr. Jonny Ruiz on 06/03/12, patient was advised to discontinue Maxzide adn Simvistatin. Caregiver states both medications were reordered at Pharmacy. RN reviewed [employer] Health Record. Documentation noted, per Dr. Jonny Ruiz on 06/03/12:  "hold diuretic for now with decreased po intake, d/c zocor" Caregiver informed and verbalizes understanding.  PLEASE RETURN CALL TO CAREGIVER/PATIENT AT [phone] REGARDING RECOMMENDATION FOR PATIENT'S CONSTIPATION OR HOME CARE ASSISTANCE.  Symptoms  Reason For Call & Symptoms: Constipation  Reviewed Health History In EMR: Yes  Reviewed Medications In EMR: Yes  Reviewed Allergies In EMR: Yes  Reviewed  Surgeries / Procedures: Yes  Date of Onset of Symptoms: 05/26/2012  Treatments Tried: Miralax  Treatments Tried Worked: No  Guideline(s) Used:  Constipation  Disposition Per Guideline:   See Today in Office  Reason For Disposition Reached:   Last bowel movement (BM) > 4 days ago  Advice Given:  General Constipation Instructions:  Eat a high fiber diet.  Drink adequate liquids.  High Fiber Diet:  Try to eat fresh fruit and vegetables at each meal (peas, prunes, citrus, apples, beans, corn).  Eat more grain foods (bran flakes, bran muffins, graham crackers, oatmeal, brown rice, and whole wheat bread). Popcorn is a source of fiber.  Liquids:  Drink 6-8 glasses of water a day (Caution: certain medical conditions require fluid restriction).  Prune juice is a natural laxative.  Call Back If:  You become worse  Osmotic Laxatives:  Miralax (polyethylene glycol 3350): Miralax is an "osmotic" agent which means that it binds water and causes water to be retained within the stool. You can use this laxative to treat occasional constipation. Do not use for more than 2 weeks without approval from your doctor. Generally, Miralax produces a bowel movement in 1 to 3 days. Side effects include diarrhea (especially at higher doses). If you are pregnant, discuss with your doctor before using. Available in the [place].  Patient Refused Recommendation:  Patient Refused Care Advice  Caregiver declines appt.  PLEASE RETURN CALL TO CAREGIVER/PATIENT AT [phone] REGARDING RECOMMENDATION FOR PATIENT'S CONSTIPATION OR HOME CARE ASSISTANCE.

## 2012-06-05 NOTE — Telephone Encounter (Signed)
Dr Jonny Ruiz, was informed by Lynden Ang at [facility] that no insurance will pay for home enema.

## 2012-06-06 ENCOUNTER — Emergency Department (HOSPITAL_COMMUNITY)
Admission: EM | Admit: 2012-06-06 | Discharge: 2012-06-06 | Disposition: A | Discharge status: Home / Self Care | Payer: Medicare Other | Attending: Emergency Medicine | Admitting: Emergency Medicine

## 2012-06-06 ENCOUNTER — Encounter (HOSPITAL_COMMUNITY): Payer: Self-pay | Admitting: *Deleted

## 2012-06-06 DIAGNOSIS — R109 Unspecified abdominal pain: Secondary | ICD-10-CM | POA: Insufficient documentation

## 2012-06-06 DIAGNOSIS — Z8739 Personal history of other diseases of the musculoskeletal system and connective tissue: Secondary | ICD-10-CM | POA: Insufficient documentation

## 2012-06-06 DIAGNOSIS — Z8719 Personal history of other diseases of the digestive system: Secondary | ICD-10-CM | POA: Insufficient documentation

## 2012-06-06 DIAGNOSIS — K5900 Constipation, unspecified: Secondary | ICD-10-CM | POA: Insufficient documentation

## 2012-06-06 DIAGNOSIS — Z9861 Coronary angioplasty status: Secondary | ICD-10-CM | POA: Insufficient documentation

## 2012-06-06 DIAGNOSIS — Z8659 Personal history of other mental and behavioral disorders: Secondary | ICD-10-CM | POA: Insufficient documentation

## 2012-06-06 DIAGNOSIS — I509 Heart failure, unspecified: Secondary | ICD-10-CM | POA: Insufficient documentation

## 2012-06-06 DIAGNOSIS — Z862 Personal history of diseases of the blood and blood-forming organs and certain disorders involving the immune mechanism: Secondary | ICD-10-CM | POA: Insufficient documentation

## 2012-06-06 DIAGNOSIS — Z87891 Personal history of nicotine dependence: Secondary | ICD-10-CM | POA: Insufficient documentation

## 2012-06-06 DIAGNOSIS — Z8669 Personal history of other diseases of the nervous system and sense organs: Secondary | ICD-10-CM | POA: Insufficient documentation

## 2012-06-06 DIAGNOSIS — I10 Essential (primary) hypertension: Secondary | ICD-10-CM | POA: Insufficient documentation

## 2012-06-06 DIAGNOSIS — Z79899 Other long term (current) drug therapy: Secondary | ICD-10-CM | POA: Insufficient documentation

## 2012-06-06 DIAGNOSIS — Z8639 Personal history of other endocrine, nutritional and metabolic disease: Secondary | ICD-10-CM | POA: Insufficient documentation

## 2012-06-06 DIAGNOSIS — Z7982 Long term (current) use of aspirin: Secondary | ICD-10-CM | POA: Insufficient documentation

## 2012-06-06 DIAGNOSIS — I2510 Atherosclerotic heart disease of native coronary artery without angina pectoris: Secondary | ICD-10-CM | POA: Insufficient documentation

## 2012-06-06 DIAGNOSIS — I252 Old myocardial infarction: Secondary | ICD-10-CM | POA: Insufficient documentation

## 2012-06-06 DIAGNOSIS — M109 Gout, unspecified: Secondary | ICD-10-CM | POA: Insufficient documentation

## 2012-06-06 DIAGNOSIS — R110 Nausea: Secondary | ICD-10-CM

## 2012-06-06 MED ORDER — ONDANSETRON 8 MG PO TBDP
8.0000 mg | ORAL_TABLET | Freq: Once | ORAL | Status: AC
  Administered 2012-06-06: 8 mg via ORAL
  Filled 2012-06-06: qty 1

## 2012-06-06 MED ORDER — POLYETHYLENE GLYCOL 3350 17 GM/SCOOP PO POWD: 17.0000 g | Freq: Every day | ORAL | Status: DC

## 2012-06-06 MED ORDER — MILK AND MOLASSES ENEMA
Freq: Once | RECTAL | Status: AC
  Administered 2012-06-06: 06:00:00 via RECTAL
  Filled 2012-06-06: qty 250

## 2012-06-06 MED ORDER — DOCUSATE SODIUM 100 MG PO CAPS: 100.0000 mg | ORAL_CAPSULE | Freq: Two times a day (BID) | ORAL | Status: DC

## 2012-06-06 NOTE — Telephone Encounter (Signed)
[facility] Triage Call Report Triage Record Num: [id number] Operator: Freddie Breech Patient Name: Beth Gutierrez Call Date & Time: 06/05/2012 7:02:49PM Patient Phone: [phone] PCP: Oliver Barre Patient Gender: Female PCP Fax : [phone] Patient DOB: 1921/03/10 Practice Name: [facility] Reason for Call: Caller: Blanche/Other; PCP: Oliver Barre (Adults only); CB#: [phone]; [name] is calling back after pt was triaged this afternoon for constipation. She was expectig a call back from Dr. Raphael Gibney nurse but has not gotten a call back. Pt's sx have not changed but she still wants to know what to do as she hasn't had a BM since 05/26/12. She advises she will pay out of pocket to have a home health nurse come to pt's house to give an enema. An appt is offered on 06/06/12 and declined. She will either wait for a call back in AM or call back herself. Constipation Protocol. Protocol(s) Used: Constipation Recommended Outcome per Protocol: See Provider within 24 hours Reason for Outcome: No bowel movement for one week and unresponsive to home care Care Advice: ~ Call provider if symptoms worsen or new symptoms develop. Rectal pain or change in bowel habits including recurrent episodes or persistent constipation or constipation alternating with diarrhea, can be the only sign of a serious problem, such as polyps or a tumor. An evaluation by provider must be scheduled.

## 2012-06-06 NOTE — Telephone Encounter (Signed)
Called the patients friend [name] [phone]) informed of MD instructions.  The friend informed she had to take the patient to the ER at 2 AM due to not having a BM in 10 days.  The friend stated how very disappointed in our office that she was not called back yesterday and that on Monday (06/03/12) when the patient had an OV with Dr. Jonny Ruiz that he did not give her something at that time as this problem was discussed.   Stated he did not examine her stomach as they did in the hospital.  The friend was very disappointed in the care her friend has gotten.

## 2012-06-06 NOTE — ED Provider Notes (Signed)
History     CSN: [id number]  Arrival date & time 06/06/12  0321   First MD Initiated Contact with Patient 06/06/12 [phone]      Chief Complaint  Patient presents with  . Abdominal Pain  . Constipation    HPI ADANA MARIK is a 79 y.o. female a 12 day history of worsening abdominal pain and no bowel movements. Patient was seen a week ago for constipation and nausea, laxatives have given her no relief, she is supposed to have an order for an enema however for some reason the order did not get through to her E[facility].  Patient's pain has been steady, cramping, moderate to severe associated with nausea but no vomiting, no diarrhea.  Patient is had a history of constipation in the past. She specifically denies chest pains, shortness of breath, headache, dizziness, lightheadedness, fevers, chills, rash, myalgias, arthralgias  Past Medical History  Diagnosis Date  . Acute myocardial infarction, unspecified site, episode of care unspecified   . Congestive heart failure, unspecified   . Coronary atherosclerosis of unspecified type of vessel, native or graft   . Unspecified essential hypertension   . Other and unspecified hyperlipidemia   . Esophageal reflux   . Depressive disorder, not elsewhere classified   . Morbid obesity   . Need for prophylactic vaccination and inoculation against influenza   . Allergic rhinitis, cause unspecified   . Other malaise and fatigue   . Disorder of bone and cartilage, unspecified   . Diverticulosis of colon (without mention of hemorrhage)   . Benign neoplasm of colon   . Gout, unspecified   . Unspecified glaucoma(365.9)   . Osteoarthrosis, unspecified whether generalized or localized, unspecified site   . Gout flare 02/06/2011  . Anemia, unspecified 04/05/2011    Past Surgical History  Procedure Laterality Date  . Coronary angioplasty with stent placement      RCA stent  . Vesicovaginal fistula closure w/ tah    . Left hip replacement      s/p  2002. Secondary to DJD  . Ectopic pregnancy surgery      Family History  Problem Relation Age of Onset  . Breast cancer Sister   . Hypertension Father   . Stroke Father     History  Substance Use Topics  . Smoking status: Former Smoker    Quit date: 05/30/1990  . Smokeless tobacco: Never Used  . Alcohol Use: No    OB History   Grav Para Term Preterm Abortions TAB SAB Ect Mult Living                  Review of Systems At least 10pt or greater review of systems completed and are negative except where specified in the HPI.  Allergies  Sulfonamide derivatives  Home Medications   Current Outpatient Rx  Name  Route  Sig  Dispense  Refill  . allopurinol (ZYLOPRIM) 100 MG tablet   Oral   Take 1 tablet (100 mg total) by mouth daily.   90 tablet   3   . aspirin (ECOTRIN LOW STRENGTH) 81 MG EC tablet   Oral   Take 81 mg by mouth daily.           . cephALEXin (KEFLEX) 500 MG capsule   Oral   Take 1 capsule (500 mg total) by mouth 4 (four) times daily.   40 capsule   0   . DULoxetine (CYMBALTA) 30 MG capsule   Oral   Take 1  capsule (30 mg total) by mouth daily after breakfast.   90 capsule   3   . ondansetron (ZOFRAN ODT) 8 MG disintegrating tablet   Oral   Take 1 tablet (8 mg total) by mouth every 8 (eight) hours as needed for nausea.   30 tablet   0   . ramipril (ALTACE) 10 MG capsule   Oral   Take 1 capsule (10 mg total) by mouth 2 (two) times daily.   180 capsule   3   . ranitidine (ZANTAC) 150 MG tablet   Oral   Take 1 tablet (150 mg total) by mouth as needed. Acid reflux   90 tablet   3   . triamterene-hydrochlorothiazide (MAXZIDE-25) 37.5-25 MG per tablet   Oral   Take 1 each (1 tablet total) by mouth daily after breakfast.   90 tablet   3   . brimonidine (ALPHAGAN P) 0.1 % SOLN   Both Eyes   Place 1 drop into both eyes 2 (two) times daily.             BP 121/55  Pulse 74  Temp(Src) 98.1 F (36.7 C) (Oral)  Resp 18  SpO2  99%  Physical Exam  Nursing notes reviewed.  Electronic medical record reviewed. VITAL SIGNS:   Filed Vitals:   06/06/12 0323 06/06/12 0708  BP: 121/55 138/54  Pulse: 74 77  Temp: 98.1 F (36.7 C)   TempSrc: Oral   Resp: 18 18  SpO2: 99% 97%   CONSTITUTIONAL: Awake, oriented, appears non-toxic HENT: Atraumatic, normocephalic, oral mucosa pink and moist, airway patent. Nares patent without drainage. External ears normal. EYES: Conjunctiva clear, EOMI, PERRLA NECK: Trachea midline, non-tender, supple CARDIOVASCULAR: Normal heart rate, Normal rhythm, No murmurs, rubs, gallops PULMONARY/CHEST: Clear to auscultation, no rhonchi, wheezes, or rales. Symmetrical breath sounds. Non-tender. ABDOMINAL: Non-distended, obese, soft, non-tender - no rebound or guarding. Masses palpated which could be the patient's a a sending and transverse colon. BS normal. RECTAL: Normal sphincter tone, no gross blood, brown stool in vault, firm stool palpated in the rectum. No hemorrhoids. NEUROLOGIC: Non-focal, moving all four extremities, no gross sensory or motor deficits. EXTREMITIES: No clubbing, cyanosis, or edema SKIN: Warm, Dry, No erythema, No rash  Stool ball in rectum, non-bloody briwn stool, abdomen is soft, NTTP, palpaplemasses  ED Course  Procedures (including critical care time)  Labs Reviewed - No data to display No results found.   1. Constipation   2. Nausea   3. Abdominal pain       MDM  Patient arrives to the ER with 12 days of not having a bowel movement. Home therapy does not work, patient given milk and molasses enema with good results. Patient's pain is relieved, she is no longer nauseous she feels much better. Put the patient on a bowel regimen consists of GlycoLax, docusate, can give her an order for soap suds enema as needed.  I explained the diagnosis and have given explicit precautions to return to the ER including any other new or worsening symptoms. The patient  understands and accepts the medical plan as it's been dictated and I have answered their questions. Discharge instructions concerning home care and prescriptions have been given.  The patient is STABLE and is discharged to home in good condition.         Jones Skene, MD 06/06/12 [phone]

## 2012-06-06 NOTE — ED Notes (Signed)
Moderate hard stool output after MOM, no other stool felt in rectal tunnel, pt states feels better.

## 2012-06-06 NOTE — ED Notes (Signed)
Pt states was here 12 days ago w/ abdominal pain, was told nothing was wrong sent home, went and saw PCP 8 days ago and was told she was constipated and had UTI, was given antibiotic, has not had a BM in 12 days, has been using at home laxatives w/ no relief. Pt complaining of upper abdominal pain w/ nausea, daughter upset nothing has been done in the past 12 days to give her relief.

## 2012-06-06 NOTE — ED Notes (Signed)
Per EMS pt was here a week ago for constipation and nausea, sent home w/ nausea, still no BM after taking at home laxatives, still having abdominal pain, and states has pain around rectum when tries to use restroom. BP 140/70, HR 72, RR 16, 99% RA.

## 2012-06-06 NOTE — ED Notes (Signed)
[id number]<AV> Expected date:06/06/12<BR> Expected time: 3:08 AM<BR> Means of arrival:Ambulance<BR> Comments:<BR> abd pain

## 2012-06-07 ENCOUNTER — Telehealth: Payer: Self-pay

## 2012-06-07 MED ORDER — RANITIDINE HCL 150 MG PO TABS: 150.0000 mg | ORAL_TABLET | Freq: Every day | ORAL | Status: DC | PRN

## 2012-06-07 NOTE — Telephone Encounter (Signed)
Please clarify frequency of Zantac 150 mg as states prn.  [facility]

## 2012-06-07 NOTE — Telephone Encounter (Signed)
rx corrected, sent to [facility]

## 2012-06-12 ENCOUNTER — Ambulatory Visit (INDEPENDENT_AMBULATORY_CARE_PROVIDER_SITE_OTHER): Payer: Medicare Other | Admitting: Internal Medicine

## 2012-06-12 ENCOUNTER — Encounter: Payer: Self-pay | Admitting: Internal Medicine

## 2012-06-12 VITALS — BP 112/70 | HR 89 | Temp 97.8°F | Wt 199.1 lb

## 2012-06-12 DIAGNOSIS — N182 Chronic kidney disease, stage 2 (mild): Secondary | ICD-10-CM

## 2012-06-12 DIAGNOSIS — I10 Essential (primary) hypertension: Secondary | ICD-10-CM

## 2012-06-12 DIAGNOSIS — N390 Urinary tract infection, site not specified: Secondary | ICD-10-CM

## 2012-06-12 NOTE — Progress Notes (Signed)
Subjective:    Patient ID: Beth Gutierrez, female    DOB: [birth year], 79 y.o.   MRN: [id number]  HPI  Here to f/u, n/v resolved, overall much improved with taking the cephalexin, and constipation resolved.  Denies urinary symptoms such as dysuria, frequency, urgency, flank pain, hematuria or n/v, fever, chills.  Still has some general weakness, hard to get her energy back.  Not confused,  Has several bruises to her arms but o/w no overt bleeding or bruising.  Pt denies chest pain, increased sob or doe, wheezing, orthopnea, PND, increased LE swelling, palpitations, dizziness or syncope.  Pt denies new neurological symptoms such as new headache, or facial or extremity weakness or numbness. Has been holding her diuretic and ACE with her weakness. Past Medical History  Diagnosis Date  . Acute myocardial infarction, unspecified site, episode of care unspecified   . Congestive heart failure, unspecified   . Coronary atherosclerosis of unspecified type of vessel, native or graft   . Unspecified essential hypertension   . Other and unspecified hyperlipidemia   . Esophageal reflux   . Depressive disorder, not elsewhere classified   . Morbid obesity   . Need for prophylactic vaccination and inoculation against influenza   . Allergic rhinitis, cause unspecified   . Other malaise and fatigue   . Disorder of bone and cartilage, unspecified   . Diverticulosis of colon (without mention of hemorrhage)   . Benign neoplasm of colon   . Gout, unspecified   . Unspecified glaucoma(365.9)   . Osteoarthrosis, unspecified whether generalized or localized, unspecified site   . Gout flare 02/06/2011  . Anemia, unspecified 04/05/2011   Past Surgical History  Procedure Laterality Date  . Coronary angioplasty with stent placement      RCA stent  . Vesicovaginal fistula closure w/ tah    . Left hip replacement      s/p 2002. Secondary to DJD  . Ectopic pregnancy surgery      reports that she quit  smoking about 22 years ago. She has never used smokeless tobacco. She reports that she does not drink alcohol or use illicit drugs. family history includes Breast cancer in her sister; Hypertension in her father; and Stroke in her father. Allergies  Allergen Reactions  . Sulfonamide Derivatives Other (See Comments)    unknown   Current Outpatient Prescriptions on File Prior to Visit  Medication Sig Dispense Refill  . allopurinol (ZYLOPRIM) 100 MG tablet Take 1 tablet (100 mg total) by mouth daily.  90 tablet  3  . aspirin (ECOTRIN LOW STRENGTH) 81 MG EC tablet Take 81 mg by mouth daily.        . brimonidine (ALPHAGAN P) 0.1 % SOLN Place 1 drop into both eyes 2 (two) times daily.        . cephALEXin (KEFLEX) 500 MG capsule Take 1 capsule (500 mg total) by mouth 4 (four) times daily.  40 capsule  0  . docusate sodium (COLACE) 100 MG capsule Take 1 capsule (100 mg total) by mouth every 12 (twelve) hours.  60 capsule  0  . DULoxetine (CYMBALTA) 30 MG capsule Take 1 capsule (30 mg total) by mouth daily after breakfast.  90 capsule  3  . ondansetron (ZOFRAN ODT) 8 MG disintegrating tablet Take 1 tablet (8 mg total) by mouth every 8 (eight) hours as needed for nausea.  30 tablet  0  . polyethylene glycol powder (GLYCOLAX) powder Take 17 g by mouth daily.  255 g  0  .  ranitidine (ZANTAC) 150 MG tablet Take 1 tablet (150 mg total) by mouth daily as needed. Acid reflux  90 tablet  3   No current facility-administered medications on file prior to visit.   Review of Systems  Constitutional: Negative for unexpected weight change, or unusual diaphoresis  HENT: Negative for tinnitus.   Eyes: Negative for photophobia and visual disturbance.  Respiratory: Negative for choking and stridor.   Gastrointestinal: Negative for vomiting and blood in stool.  Genitourinary: Negative for hematuria and decreased urine volume.  Musculoskeletal: Negative for acute joint swelling Skin: Negative for color change and  wound.  Neurological: Negative for tremors and numbness other than noted  Psychiatric/Behavioral: Negative for decreased concentration or  hyperactivity.       Objective:   Physical Exam BP 112/70  Pulse 89  Temp(Src) 97.8 F (36.6 C) (Oral)  Wt 199 lb 2 oz (90.323 kg)  BMI 35.28 kg/m2  SpO2 93% VS noted,  Constitutional: Pt appears well-developed and well-nourished.  HENT: Head: NCAT.  Right Ear: External ear normal.  Left Ear: External ear normal.  Eyes: Conjunctivae and EOM are normal. Pupils are equal, round, and reactive to light.  Neck: Normal range of motion. Neck supple.  Cardiovascular: Normal rate and regular rhythm.   Pulmonary/Chest: Effort normal and breath sounds normal. - no rales or wheezing Abd:  Soft, NT, non-distended, + BS Neurological: Pt is alert. Not confused , motor intact, walks with walker Skin: Skin is warm. No erythema.  Psychiatric: Pt behavior is normal. Thought content normal.     Assessment & Plan:

## 2012-06-12 NOTE — Assessment & Plan Note (Signed)
N/v resolved, takes po ok, declines further labs today, will need bmet next visit

## 2012-06-12 NOTE — Assessment & Plan Note (Signed)
Tolerating tx, to finish antibx

## 2012-06-12 NOTE — Patient Instructions (Signed)
Please finish your antibiotic as you are doing Please continue all other medications as before, EXCEPT ok to continue to hold on taking the fluid pill (triamterene-hydrochlorothiazide) and the ramipril (blood pressure medication) I think we can hold off on more blood work today Please return in 3 weeks, or sooner if needed

## 2012-06-12 NOTE — Assessment & Plan Note (Signed)
stable overall by history and exam, recent data reviewed with pt, and pt to continue medical treatment as before,  to f/u any worsening symptoms or concerns BP Readings from Last 3 Encounters:  06/12/12 112/70  06/06/12 138/54  06/03/12 110/62   Pt has been holding her BP med due to general weakness, ok to cont this , f/u next visit

## 2012-06-18 ENCOUNTER — Ambulatory Visit: Payer: Medicare Other | Admitting: Internal Medicine

## 2012-07-03 ENCOUNTER — Ambulatory Visit: Payer: Medicare Other | Admitting: Internal Medicine

## 2012-07-18 ENCOUNTER — Ambulatory Visit: Payer: Medicare Other | Admitting: Internal Medicine

## 2012-08-23 ENCOUNTER — Telehealth: Payer: Self-pay

## 2012-08-23 ENCOUNTER — Telehealth: Payer: Self-pay | Admitting: *Deleted

## 2012-08-23 MED ORDER — ONDANSETRON HCL 4 MG PO TABS: 4.0000 mg | ORAL_TABLET | Freq: Three times a day (TID) | ORAL | Status: DC | PRN

## 2012-08-23 NOTE — Telephone Encounter (Signed)
Done erx 

## 2012-08-23 NOTE — Telephone Encounter (Signed)
Refill odansetron

## 2012-08-23 NOTE — Telephone Encounter (Signed)
Patient informed. 

## 2012-08-23 NOTE — Telephone Encounter (Signed)
Pt called requesting an Rx for nausea.  Last Rx was 4.2014.  Please advise

## 2012-09-10 ENCOUNTER — Encounter (HOSPITAL_BASED_OUTPATIENT_CLINIC_OR_DEPARTMENT_OTHER): Payer: Self-pay

## 2012-09-10 ENCOUNTER — Other Ambulatory Visit: Payer: Self-pay

## 2012-09-10 ENCOUNTER — Emergency Department (HOSPITAL_BASED_OUTPATIENT_CLINIC_OR_DEPARTMENT_OTHER): Payer: Medicare Other

## 2012-09-10 ENCOUNTER — Other Ambulatory Visit (HOSPITAL_BASED_OUTPATIENT_CLINIC_OR_DEPARTMENT_OTHER): Payer: Medicare Other

## 2012-09-10 ENCOUNTER — Inpatient Hospital Stay (HOSPITAL_BASED_OUTPATIENT_CLINIC_OR_DEPARTMENT_OTHER)
Admission: EM | Admit: 2012-09-10 | Discharge: 2012-09-16 | DRG: 281 | Disposition: A | Discharge status: Skilled Nursing Facility | Payer: Medicare Other | Attending: Internal Medicine | Admitting: Internal Medicine

## 2012-09-10 DIAGNOSIS — I10 Essential (primary) hypertension: Secondary | ICD-10-CM | POA: Diagnosis present

## 2012-09-10 DIAGNOSIS — F329 Major depressive disorder, single episode, unspecified: Secondary | ICD-10-CM

## 2012-09-10 DIAGNOSIS — F3289 Other specified depressive episodes: Secondary | ICD-10-CM

## 2012-09-10 DIAGNOSIS — N390 Urinary tract infection, site not specified: Secondary | ICD-10-CM

## 2012-09-10 DIAGNOSIS — D509 Iron deficiency anemia, unspecified: Secondary | ICD-10-CM | POA: Diagnosis present

## 2012-09-10 DIAGNOSIS — R6510 Systemic inflammatory response syndrome (SIRS) of non-infectious origin without acute organ dysfunction: Secondary | ICD-10-CM

## 2012-09-10 DIAGNOSIS — M25561 Pain in right knee: Secondary | ICD-10-CM

## 2012-09-10 DIAGNOSIS — R55 Syncope and collapse: Secondary | ICD-10-CM | POA: Diagnosis present

## 2012-09-10 DIAGNOSIS — M1990 Unspecified osteoarthritis, unspecified site: Secondary | ICD-10-CM

## 2012-09-10 DIAGNOSIS — M25512 Pain in left shoulder: Secondary | ICD-10-CM

## 2012-09-10 DIAGNOSIS — S42209A Unspecified fracture of upper end of unspecified humerus, initial encounter for closed fracture: Secondary | ICD-10-CM | POA: Diagnosis present

## 2012-09-10 DIAGNOSIS — M899 Disorder of bone, unspecified: Secondary | ICD-10-CM

## 2012-09-10 DIAGNOSIS — D649 Anemia, unspecified: Secondary | ICD-10-CM

## 2012-09-10 DIAGNOSIS — K5730 Diverticulosis of large intestine without perforation or abscess without bleeding: Secondary | ICD-10-CM

## 2012-09-10 DIAGNOSIS — N182 Chronic kidney disease, stage 2 (mild): Secondary | ICD-10-CM | POA: Diagnosis present

## 2012-09-10 DIAGNOSIS — I129 Hypertensive chronic kidney disease with stage 1 through stage 4 chronic kidney disease, or unspecified chronic kidney disease: Secondary | ICD-10-CM | POA: Diagnosis present

## 2012-09-10 DIAGNOSIS — N184 Chronic kidney disease, stage 4 (severe): Secondary | ICD-10-CM | POA: Diagnosis present

## 2012-09-10 DIAGNOSIS — I5022 Chronic systolic (congestive) heart failure: Secondary | ICD-10-CM | POA: Diagnosis present

## 2012-09-10 DIAGNOSIS — I252 Old myocardial infarction: Secondary | ICD-10-CM

## 2012-09-10 DIAGNOSIS — Z0000 Encounter for general adult medical examination without abnormal findings: Secondary | ICD-10-CM

## 2012-09-10 DIAGNOSIS — F0390 Unspecified dementia without behavioral disturbance: Secondary | ICD-10-CM | POA: Diagnosis present

## 2012-09-10 DIAGNOSIS — R112 Nausea with vomiting, unspecified: Secondary | ICD-10-CM

## 2012-09-10 DIAGNOSIS — R7989 Other specified abnormal findings of blood chemistry: Secondary | ICD-10-CM

## 2012-09-10 DIAGNOSIS — K219 Gastro-esophageal reflux disease without esophagitis: Secondary | ICD-10-CM

## 2012-09-10 DIAGNOSIS — D72829 Elevated white blood cell count, unspecified: Secondary | ICD-10-CM | POA: Diagnosis present

## 2012-09-10 DIAGNOSIS — J309 Allergic rhinitis, unspecified: Secondary | ICD-10-CM

## 2012-09-10 DIAGNOSIS — Z01811 Encounter for preprocedural respiratory examination: Secondary | ICD-10-CM

## 2012-09-10 DIAGNOSIS — I509 Heart failure, unspecified: Secondary | ICD-10-CM

## 2012-09-10 DIAGNOSIS — R1013 Epigastric pain: Secondary | ICD-10-CM

## 2012-09-10 DIAGNOSIS — D126 Benign neoplasm of colon, unspecified: Secondary | ICD-10-CM

## 2012-09-10 DIAGNOSIS — H409 Unspecified glaucoma: Secondary | ICD-10-CM

## 2012-09-10 DIAGNOSIS — W19XXXA Unspecified fall, initial encounter: Secondary | ICD-10-CM

## 2012-09-10 DIAGNOSIS — I2510 Atherosclerotic heart disease of native coronary artery without angina pectoris: Secondary | ICD-10-CM | POA: Diagnosis present

## 2012-09-10 DIAGNOSIS — R269 Unspecified abnormalities of gait and mobility: Secondary | ICD-10-CM

## 2012-09-10 DIAGNOSIS — M79609 Pain in unspecified limb: Secondary | ICD-10-CM

## 2012-09-10 DIAGNOSIS — I214 Non-ST elevation (NSTEMI) myocardial infarction: Secondary | ICD-10-CM | POA: Diagnosis present

## 2012-09-10 DIAGNOSIS — S42213A Unspecified displaced fracture of surgical neck of unspecified humerus, initial encounter for closed fracture: Secondary | ICD-10-CM | POA: Diagnosis present

## 2012-09-10 DIAGNOSIS — R5383 Other fatigue: Secondary | ICD-10-CM

## 2012-09-10 DIAGNOSIS — M109 Gout, unspecified: Secondary | ICD-10-CM

## 2012-09-10 DIAGNOSIS — E785 Hyperlipidemia, unspecified: Secondary | ICD-10-CM

## 2012-09-10 DIAGNOSIS — Z87891 Personal history of nicotine dependence: Secondary | ICD-10-CM

## 2012-09-10 DIAGNOSIS — M25511 Pain in right shoulder: Secondary | ICD-10-CM

## 2012-09-10 DIAGNOSIS — R5381 Other malaise: Secondary | ICD-10-CM

## 2012-09-10 DIAGNOSIS — I959 Hypotension, unspecified: Secondary | ICD-10-CM | POA: Diagnosis present

## 2012-09-10 DIAGNOSIS — E46 Unspecified protein-calorie malnutrition: Secondary | ICD-10-CM | POA: Diagnosis present

## 2012-09-10 LAB — CBC WITH DIFFERENTIAL/PLATELET
Basophils Absolute: 0 10*3/uL (ref 0.0–0.1)
Eosinophils Absolute: 0 10*3/uL (ref 0.0–0.7)
Eosinophils Relative: 0 % (ref 0–5)
Lymphocytes Relative: 5 % — ABNORMAL LOW (ref 12–46)
Monocytes Relative: 4 % (ref 3–12)
Neutrophils Relative %: 87 % — ABNORMAL HIGH (ref 43–77)
Platelets: 297 10*3/uL (ref 150–400)
RBC: 3.2 MIL/uL — ABNORMAL LOW (ref 3.87–5.11)
RDW: 15.7 % — ABNORMAL HIGH (ref 11.5–15.5)
WBC: 19.7 10*3/uL — ABNORMAL HIGH (ref 4.0–10.5)

## 2012-09-10 LAB — COMPREHENSIVE METABOLIC PANEL
ALT: 7 U/L (ref 0–35)
AST: 21 U/L (ref 0–37)
Albumin: 3.9 g/dL (ref 3.5–5.2)
Alkaline Phosphatase: 92 U/L (ref 39–117)
BUN: 20 mg/dL (ref 6–23)
CO2: 20 mEq/L (ref 19–32)
Calcium: 8.8 mg/dL (ref 8.4–10.5)
Chloride: 107 mEq/L (ref 96–112)
Chloride: 108 mEq/L (ref 96–112)
Creatinine, Ser: 1.4 mg/dL — ABNORMAL HIGH (ref 0.50–1.10)
GFR calc Af Amer: 37 mL/min — ABNORMAL LOW (ref >90)
GFR calc non Af Amer: 32 mL/min — ABNORMAL LOW (ref >90)
Glucose, Bld: 118 mg/dL — ABNORMAL HIGH (ref 70–99)
Potassium: 3.9 mEq/L (ref 3.5–5.1)
Total Bilirubin: 0.2 mg/dL — ABNORMAL LOW (ref 0.3–1.2)
Total Bilirubin: 0.3 mg/dL (ref 0.3–1.2)
Total Protein: 7.1 g/dL (ref 6.0–8.3)

## 2012-09-10 LAB — URINALYSIS, ROUTINE W REFLEX MICROSCOPIC
Glucose, UA: NEGATIVE mg/dL
Ketones, ur: 15 mg/dL — AB
Leukocytes, UA: NEGATIVE
Nitrite: NEGATIVE
Specific Gravity, Urine: 1.023 (ref 1.005–1.030)
pH: 5 (ref 5.0–8.0)

## 2012-09-10 LAB — URINE MICROSCOPIC-ADD ON

## 2012-09-10 LAB — TROPONIN I: Troponin I: 5.96 ng/mL (ref <0.30)

## 2012-09-10 LAB — APTT: aPTT: 24 seconds (ref 24–37)

## 2012-09-10 LAB — GLUCOSE, CAPILLARY

## 2012-09-10 LAB — CBC
HCT: 29.6 % — ABNORMAL LOW (ref 36.0–46.0)
Hemoglobin: 9.7 g/dL — ABNORMAL LOW (ref 12.0–15.0)
MCV: 97 fL (ref 78.0–100.0)
WBC: 15 10*3/uL — ABNORMAL HIGH (ref 4.0–10.5)

## 2012-09-10 LAB — TYPE AND SCREEN: ABO/RH(D): A POS

## 2012-09-10 MED ORDER — ASPIRIN 81 MG PO CHEW
324.0000 mg | CHEWABLE_TABLET | ORAL | Status: AC
  Administered 2012-09-10: 324 mg via ORAL
  Filled 2012-09-10: qty 4

## 2012-09-10 MED ORDER — PIPERACILLIN-TAZOBACTAM 3.375 G IVPB
3.3750 g | Freq: Three times a day (TID) | INTRAVENOUS | Status: DC
  Administered 2012-09-10 – 2012-09-12 (×4): 3.375 g via INTRAVENOUS
  Filled 2012-09-10 (×7): qty 50

## 2012-09-10 MED ORDER — SODIUM CHLORIDE 0.9 % IV BOLUS (SEPSIS)
500.0000 mL | Freq: Once | INTRAVENOUS | Status: AC
  Administered 2012-09-10: 500 mL via INTRAVENOUS

## 2012-09-10 MED ORDER — METOPROLOL TARTRATE 12.5 MG HALF TABLET
12.5000 mg | ORAL_TABLET | Freq: Two times a day (BID) | ORAL | Status: DC
  Administered 2012-09-11 – 2012-09-16 (×12): 12.5 mg via ORAL
  Filled 2012-09-10 (×13): qty 1

## 2012-09-10 MED ORDER — ACETAMINOPHEN 325 MG PO TABS: 650.0000 mg | ORAL_TABLET | ORAL | Status: DC | PRN

## 2012-09-10 MED ORDER — ASPIRIN 325 MG PO TABS
325.0000 mg | ORAL_TABLET | Freq: Every day | ORAL | Status: DC
  Filled 2012-09-10 (×2): qty 1

## 2012-09-10 MED ORDER — ATORVASTATIN CALCIUM 80 MG PO TABS
80.0000 mg | ORAL_TABLET | Freq: Every day | ORAL | Status: DC
  Administered 2012-09-11 – 2012-09-15 (×5): 80 mg via ORAL
  Filled 2012-09-10 (×6): qty 1

## 2012-09-10 MED ORDER — FENTANYL CITRATE 0.05 MG/ML IJ SOLN
50.0000 ug | Freq: Once | INTRAMUSCULAR | Status: AC
  Administered 2012-09-10: 50 ug via INTRAVENOUS
  Filled 2012-09-10: qty 2

## 2012-09-10 MED ORDER — ONDANSETRON HCL 4 MG PO TABS: 4.0000 mg | ORAL_TABLET | Freq: Three times a day (TID) | ORAL | Status: DC | PRN

## 2012-09-10 MED ORDER — HEPARIN SODIUM (PORCINE) 5000 UNIT/ML IJ SOLN
5000.0000 [IU] | Freq: Three times a day (TID) | INTRAMUSCULAR | Status: DC
  Administered 2012-09-10 – 2012-09-11 (×2): 5000 [IU] via SUBCUTANEOUS
  Filled 2012-09-10 (×5): qty 1

## 2012-09-10 MED ORDER — SODIUM CHLORIDE 0.9 % IV BOLUS (SEPSIS)
1000.0000 mL | Freq: Once | INTRAVENOUS | Status: AC
  Administered 2012-09-10: 1000 mL via INTRAVENOUS

## 2012-09-10 MED ORDER — FAMOTIDINE 20 MG PO TABS
20.0000 mg | ORAL_TABLET | Freq: Two times a day (BID) | ORAL | Status: DC
  Administered 2012-09-10: 20 mg via ORAL
  Filled 2012-09-10 (×3): qty 1

## 2012-09-10 MED ORDER — MORPHINE SULFATE 2 MG/ML IJ SOLN: 1.0000 mg | Freq: Once | INTRAMUSCULAR | Status: DC

## 2012-09-10 MED ORDER — VANCOMYCIN HCL IN DEXTROSE 1-5 GM/200ML-% IV SOLN
1000.0000 mg | INTRAVENOUS | Status: DC
  Administered 2012-09-11: 1000 mg via INTRAVENOUS
  Filled 2012-09-10 (×2): qty 200

## 2012-09-10 MED ORDER — ALLOPURINOL 100 MG PO TABS
100.0000 mg | ORAL_TABLET | Freq: Every day | ORAL | Status: DC
  Administered 2012-09-10 – 2012-09-16 (×7): 100 mg via ORAL
  Filled 2012-09-10 (×8): qty 1

## 2012-09-10 MED ORDER — SODIUM CHLORIDE 0.9 % IV SOLN
INTRAVENOUS | Status: AC
  Administered 2012-09-10: 17:00:00 via INTRAVENOUS

## 2012-09-10 MED ORDER — POTASSIUM CHLORIDE IN NACL 20-0.9 MEQ/L-% IV SOLN
INTRAVENOUS | Status: DC
  Administered 2012-09-10: 10000 mL via INTRAVENOUS
  Administered 2012-09-11: 10:00:00 via INTRAVENOUS
  Filled 2012-09-10 (×4): qty 1000

## 2012-09-10 MED ORDER — ASPIRIN 300 MG RE SUPP
300.0000 mg | RECTAL | Status: AC
  Filled 2012-09-10: qty 1

## 2012-09-10 MED ORDER — ONDANSETRON HCL 4 MG/2ML IJ SOLN
4.0000 mg | Freq: Four times a day (QID) | INTRAMUSCULAR | Status: DC | PRN
  Administered 2012-09-11: 4 mg via INTRAVENOUS
  Filled 2012-09-10 (×2): qty 2

## 2012-09-10 MED ORDER — NITROGLYCERIN 0.4 MG SL SUBL: 0.4000 mg | SUBLINGUAL_TABLET | SUBLINGUAL | Status: DC | PRN

## 2012-09-10 MED ORDER — PIPERACILLIN-TAZOBACTAM 3.375 G IVPB
3.3750 g | Freq: Once | INTRAVENOUS | Status: AC
  Administered 2012-09-10: 3.375 g via INTRAVENOUS
  Filled 2012-09-10: qty 50

## 2012-09-10 MED ORDER — MORPHINE SULFATE 2 MG/ML IJ SOLN
INTRAMUSCULAR | Status: AC
  Filled 2012-09-10: qty 1

## 2012-09-10 MED ORDER — VANCOMYCIN HCL IN DEXTROSE 1-5 GM/200ML-% IV SOLN
1000.0000 mg | Freq: Once | INTRAVENOUS | Status: AC
  Administered 2012-09-10: 1000 mg via INTRAVENOUS
  Filled 2012-09-10: qty 200

## 2012-09-10 NOTE — Progress Notes (Signed)
PENDING ACCEPTANCE TRANFER NOTE:  Call received from:    Dr. Rosalia Hammers  REASON FOR REQUESTING TRANSFER:    Syncope/fall  HPI:   The patient lives in independent-living facility had a witnessed fall, she was found in the morning by the facility staff laying on the floor, came in with a left humeral fracture. She was found to have report of 6, per ED physician no complaints of chest pain, the lady can tell the history which obtained from her. I have asked to call cardiology to see the except the patient, EDP called me back that [facility] cardiology said patient will be okay to be in the hospital service, they should be reconsulted [facility]. She also needs orthopedic consult for left humeral fracture.  PLAN:  According to telephone report, this patient was accepted for transfer to [facility],   Under [facility] team:  10,  I have requested an order be written to call Flow Manager at [phone] upon patient arrival to the floor for final physician assignment who will do the admission and give admitting orders.  SIGNED: Clint Lipps, MD [facility]  09/10/2012, 4:25 PM

## 2012-09-10 NOTE — ED Provider Notes (Signed)
CSN: [id number]     Arrival date & time 09/10/12  1226 History     First MD Initiated Contact with Patient 09/10/12 1300     Chief Complaint  Patient presents with  . Fall  . Arm Injury   (Consider location/radiation/quality/duration/timing/severity/associated sxs/prior Treatment) HPI This is an 79 year old female who lives in independent living area and she states she lost her balance and fell backwards last night. He was unable to get up off the floor and laid on the floor all night. She is complaining of pain in her left shoulder. She denies any other injuries. She denies any head pain, neck pain, chest pain, abdominal pain, nausea or vomiting. She has not had any urinary frequency, fever or chills. Past Medical History  Diagnosis Date  . Acute myocardial infarction, unspecified site, episode of care unspecified   . Congestive heart failure, unspecified   . Coronary atherosclerosis of unspecified type of vessel, native or graft   . Unspecified essential hypertension   . Other and unspecified hyperlipidemia   . Esophageal reflux   . Depressive disorder, not elsewhere classified   . Morbid obesity   . Need for prophylactic vaccination and inoculation against influenza   . Allergic rhinitis, cause unspecified   . Other malaise and fatigue   . Disorder of bone and cartilage, unspecified   . Diverticulosis of colon (without mention of hemorrhage)   . Benign neoplasm of colon   . Gout, unspecified   . Unspecified glaucoma(365.9)   . Osteoarthrosis, unspecified whether generalized or localized, unspecified site   . Gout flare 02/06/2011  . Anemia, unspecified 04/05/2011   Past Surgical History  Procedure Laterality Date  . Coronary angioplasty with stent placement      RCA stent  . Vesicovaginal fistula closure w/ tah    . Left hip replacement      s/p 2002. Secondary to DJD  . Ectopic pregnancy surgery     Family History  Problem Relation Age of Onset  . Breast cancer  Sister   . Hypertension Father   . Stroke Father    History  Substance Use Topics  . Smoking status: Former Smoker    Quit date: 05/30/1990  . Smokeless tobacco: Never Used  . Alcohol Use: No   OB History   Grav Para Term Preterm Abortions TAB SAB Ect Mult Living                 Review of Systems  All other systems reviewed and are negative.    Allergies  Sulfonamide derivatives  Home Medications   Current Outpatient Rx  Name  Route  Sig  Dispense  Refill  . allopurinol (ZYLOPRIM) 100 MG tablet   Oral   Take 1 tablet (100 mg total) by mouth daily.   90 tablet   3   . aspirin (ECOTRIN LOW STRENGTH) 81 MG EC tablet   Oral   Take 81 mg by mouth daily.           . brimonidine (ALPHAGAN P) 0.1 % SOLN   Both Eyes   Place 1 drop into both eyes 2 (two) times daily.           . cephALEXin (KEFLEX) 500 MG capsule   Oral   Take 1 capsule (500 mg total) by mouth 4 (four) times daily.   40 capsule   0   . docusate sodium (COLACE) 100 MG capsule   Oral   Take 1 capsule (100 mg total)  by mouth every 12 (twelve) hours.   60 capsule   0   . DULoxetine (CYMBALTA) 30 MG capsule   Oral   Take 1 capsule (30 mg total) by mouth daily after breakfast.   90 capsule   3   . ondansetron (ZOFRAN ODT) 8 MG disintegrating tablet   Oral   Take 1 tablet (8 mg total) by mouth every 8 (eight) hours as needed for nausea.   30 tablet   0   . ondansetron (ZOFRAN) 4 MG tablet   Oral   Take 1 tablet (4 mg total) by mouth every 8 (eight) hours as needed for nausea.   30 tablet   4   . polyethylene glycol powder (GLYCOLAX) powder   Oral   Take 17 g by mouth daily.   255 g   0   . ranitidine (ZANTAC) 150 MG tablet   Oral   Take 1 tablet (150 mg total) by mouth daily as needed. Acid reflux   90 tablet   3    BP 87/42  Pulse 93  Temp(Src) 97.7 F (36.5 C) (Oral)  Resp 20  SpO2 96% Physical Exam  Nursing note and vitals reviewed. Constitutional: She is oriented to  person, place, and time. She appears well-developed and well-nourished.  HENT:  Head: Atraumatic.  Mucous membranes are dry  Eyes: Conjunctivae and EOM are normal. Pupils are equal, round, and reactive to light.  Neck: Normal range of motion. Neck supple.  Cardiovascular: Normal rate, regular rhythm and normal heart sounds.   Pulmonary/Chest: Effort normal.  Abdominal: Soft.  Musculoskeletal:  Patient with tenderness palpation of her humerus and shoulder with contusion of her mid humerus and some tenderness palpation of the left hand. His block to range of motion of her fingers wrist and elbow. Shoulder movement is restricted secondary to pain  Neurological: She is alert and oriented to person, place, and time.  Skin: Skin is warm and dry.  Psychiatric: She has a normal mood and affect.    ED Course   Procedures (including critical care time)  Labs Reviewed  CBC WITH DIFFERENTIAL - Abnormal; Notable for the following:    WBC 19.7 (*)    RBC 3.20 (*)    Hemoglobin 10.3 (*)    HCT 32.0 (*)    RDW 15.7 (*)    All other components within normal limits  COMPREHENSIVE METABOLIC PANEL - Abnormal; Notable for the following:    Glucose, Bld 118 (*)    Creatinine, Ser 1.40 (*)    Total Bilirubin 0.2 (*)    GFR calc non Af Amer 32 (*)    GFR calc Af Amer 37 (*)    All other components within normal limits  TROPONIN I - Abnormal; Notable for the following:    Troponin I 5.96 (*)    All other components within normal limits  CK - Abnormal; Notable for the following:    Total CK 240 (*)    All other components within normal limits  URINALYSIS, ROUTINE W REFLEX MICROSCOPIC  LACTIC ACID, PLASMA   No results found. No diagnosis found. Dg Chest 1 View  09/10/2012   *RADIOLOGY REPORT*  Clinical Data: Unwitnessed fall last night.  Left humeral fracture.  CHEST - 1 VIEW  Comparison: 06/03/2012.  Findings: There are lower lung volumes and more lordotic positioning currently.  Allowing for  this, the heart size and mediastinal contours are stable.  There is increased linear atelectasis at the right lung base.  The left lung appears clear. Mildly comminuted fracture of the left humeral neck is noted.  No other acute osseous findings are identified.  Glenohumeral degenerative changes are present bilaterally.  IMPRESSION:  Low lung volumes with resulting right basilar atelectasis.  Left humeral neck fracture.   Original Report Authenticated By: Carey Bullocks, M.D.   Dg Hip Complete Right  09/10/2012   *RADIOLOGY REPORT*  Clinical Data: Larey Seat yesterday.  Right hip pain.  RIGHT HIP - COMPLETE 2+ VIEW  Comparison: None.  Findings: No evidence of acute fracture or dislocation.  Severe joint space narrowing with associated hypertrophic spurring involving the acetabulum and the femoral head.  Moderate osseous demineralization.  Included AP pelvis demonstrates a prior left total hip arthroplasty with anatomic alignment and no complicating features.  Symphysis pubis intact.  Sacroiliac joints intact with degenerative changes. Degenerative changes involving the visualized lower lumbar spine.  IMPRESSION: No acute osseous abnormality.  Severe osteoarthritis involving the right hip.   Original Report Authenticated By: Hulan Saas, M.D.   Ct Head Wo Contrast  09/10/2012   *RADIOLOGY REPORT*  Clinical Data: Fall  CT HEAD WITHOUT CONTRAST  Technique:  Contiguous axial images were obtained from the base of the skull through the vertex without contrast.  Comparison: none  Findings: Generalized atrophy.  Chronic microvascular ischemic change in the white matter.  Negative for acute infarct.  Negative for hemorrhage mass or skull fracture.  Sinusitis with air-fluid levels in the left ethmoid and sphenoid sinuses as well as chronic mucosal thickening and bony thickening of the left ethmoid and sphenoid sinus.  IMPRESSION: Atrophy and chronic microvascular ischemic change.  No acute intracranial abnormality.   Sinusitis.   Original Report Authenticated By: Janeece Riggers, M.D.   Dg Shoulder Left  09/10/2012   *RADIOLOGY REPORT*  Clinical Data: Shoulder pain status post fall.  LEFT SHOULDER - 2+ VIEW  Comparison: None.  Findings: There is a comminuted and mildly displaced acute fracture involving the left humeral neck.  No involvement of the humeral head articular surface is identified.  There is no dislocation. There are underlying advanced glenohumeral degenerative changes. The subacromial space is preserved.  IMPRESSION: Comminuted fracture of the left humeral neck.  Severe underlying glenohumeral degenerative changes.   Original Report Authenticated By: Carey Bullocks, M.D.   Dg Humerus Left  09/10/2012   *RADIOLOGY REPORT*  Clinical Data: Fall yesterday.  Left humeral neck fracture identified on earlier shoulder imaging.  LEFT HUMERUS - 2+ VIEW  Comparison: Left shoulder x-rays obtained earlier same date.  Findings: Comminuted impacted fracture involving the humeral neck as noted on the shoulder x-rays.  No fractures elsewhere involving the humerus.  Elbow joint intact with well-preserved joint spaces. Generalized osseous demineralization.  IMPRESSION: Left humeral neck fracture as noted on the earlier shoulder imaging.  No fractures elsewhere involving the left humerus.   Original Report Authenticated By: Hulan Saas, M.D.   Dg Hand Complete Left  09/10/2012   *RADIOLOGY REPORT*  Clinical Data: Hand injury status post fall last night.  LEFT HAND - COMPLETE 3+ VIEW  Comparison: None.  Findings: The bones appear mildly demineralized.  There is no evidence of acute fracture or dislocation.  There are moderately advanced degenerative changes at the first carpal metacarpal articulation. Mild degenerative changes are noted throughout the metacarpal phalangeal joints.  No focal soft tissue swelling is evident.  IMPRESSION: No acute osseous findings.  Degenerative changes as described.   Original Report  Authenticated By: Carey Bullocks, M.D.  Results for orders placed during the hospital encounter of 09/10/12  CBC WITH DIFFERENTIAL      Result Value Range   WBC 19.7 (*) 4.0 - 10.5 K/uL   RBC 3.20 (*) 3.87 - 5.11 MIL/uL   Hemoglobin 10.3 (*) 12.0 - 15.0 g/dL   HCT 29.5 (*) 62.1 - 30.8 %   MCV 100.0  78.0 - 100.0 fL   MCH 32.2  26.0 - 34.0 pg   MCHC 32.2  30.0 - 36.0 g/dL   RDW 65.7 (*) 84.6 - 96.2 %   Platelets 297  150 - 400 K/uL   Neutrophils Relative % 87 (*) 43 - 77 %   Lymphocytes Relative 5 (*) 12 - 46 %   Monocytes Relative 4  3 - 12 %   Eosinophils Relative 0  0 - 5 %   Basophils Relative 0  0 - 1 %   Band Neutrophils 4  0 - 10 %   Neutro Abs 17.9 (*) 1.7 - 7.7 K/uL   Lymphs Abs 1.0  0.7 - 4.0 K/uL   Monocytes Absolute 0.8  0.1 - 1.0 K/uL   Eosinophils Absolute 0.0  0.0 - 0.7 K/uL   Basophils Absolute 0.0  0.0 - 0.1 K/uL   RBC Morphology STOMATOCYTES    COMPREHENSIVE METABOLIC PANEL      Result Value Range   Sodium 145  135 - 145 mEq/L   Potassium 3.9  3.5 - 5.1 mEq/L   Chloride 107  96 - 112 mEq/L   CO2 22  19 - 32 mEq/L   Glucose, Bld 118 (*) 70 - 99 mg/dL   BUN 20  6 - 23 mg/dL   Creatinine, Ser 9.52 (*) 0.50 - 1.10 mg/dL   Calcium 9.8  8.4 - 84.1 mg/dL   Total Protein 7.1  6.0 - 8.3 g/dL   Albumin 3.9  3.5 - 5.2 g/dL   AST 21  0 - 37 U/L   ALT 6  0 - 35 U/L   Alkaline Phosphatase 92  39 - 117 U/L   Total Bilirubin 0.2 (*) 0.3 - 1.2 mg/dL   GFR calc non Af Amer 32 (*) >90 mL/min   GFR calc Af Amer 37 (*) >90 mL/min  TROPONIN I      Result Value Range   Troponin I 5.96 (*) <0.30 ng/mL  URINALYSIS, ROUTINE W REFLEX MICROSCOPIC      Result Value Range   Color, Urine AMBER (*) YELLOW   APPearance CLOUDY (*) CLEAR   Specific Gravity, Urine 1.023  1.005 - 1.030   pH 5.0  5.0 - 8.0   Glucose, UA NEGATIVE  NEGATIVE mg/dL   Hgb urine dipstick NEGATIVE  NEGATIVE   Bilirubin Urine SMALL (*) NEGATIVE   Ketones, ur 15 (*) NEGATIVE mg/dL   Protein, ur 30 (*)  NEGATIVE mg/dL   Urobilinogen, UA 1.0  0.0 - 1.0 mg/dL   Nitrite NEGATIVE  NEGATIVE   Leukocytes, UA NEGATIVE  NEGATIVE  CK      Result Value Range   Total CK 240 (*) 7 - 177 U/L  URINE MICROSCOPIC-ADD ON      Result Value Range   Squamous Epithelial / LPF RARE  RARE   WBC, UA 0-2  <3 WBC/hpf   RBC / HPF 0-2  <3 RBC/hpf   Bacteria, UA FEW (*) RARE   Casts HYALINE CASTS (*) NEGATIVE   Urine-Other MUCOUS PRESENT    CG4 I-STAT (LACTIC ACID)  Result Value Range   Lactic Acid, Venous 1.24  0.5 - 2.2 mmol/L    MDM  Patient with fall last night with left humerus fracture and hypotension.  Patient without obvious source of infection and may be simple volume depletion. Patient was given iv ns here and bp increased from sbp 80 to sbp 110.   Denies chest pain.  EKG abnormal but stable from prior- total ck elevated and troponin elevated c.w. Fall and immobilization on floor over night.    1-fall 2- humeral neck fx- plan immobilization, f/u ortho 3- hypotension- volume depletion vs cardiac vs sepsis- patient elevated wbc, will cover for sepsis of unkown etiology. Lactic acid normal.   4- ekg abnormality and elevated troponin- ekg changes appear stable from prior.  No chest pain.  Troponin elevated- cardiology consult .  History of mi seen by Dr. Daleen Squibb in office.   PMD Rosine Beat   Discussed with Dr. Arthor Captain and requests cardiology be consulted due to elevated troponin.  Cardiology page.   Hilario Quarry, MD 09/10/12 [phone]

## 2012-09-10 NOTE — ED Notes (Signed)
Pt fell last pm while reaching for her nightgown.  Pt was lying on floor all night and was found [facility].  She has left arm pain.

## 2012-09-10 NOTE — Consult Note (Signed)
Reason for Consult: Elevated troponin with known CAD Referring Physician: Dr. Allena Katz Primary Cardiologist: Dr. Georges Lynch is an 79 y.o. female.  HPI: Ms. Gores is an 79 yo woman with PMH of hypertension, GERD, dyslipidemia and 2v CAD last LHC 2001 who had a fall last night while standing up and putting her clothes away. She lives in an assisted living facility. She gets around well, tells me she never has CP (not > 10 years), no lightheadedness, no syncope/presyncope and that her fall today is not normal for her and without presyncope/syncope or balance issues. She doesn't recall palpitations or chest pain at time of fall. In the ER she had an unrevealing CT head but an x-ray series revealed a left humeral neck fracture. Cardiology consulted because an elevated troponin was found. She has no chest pain, no pre-syncope, no syncope. No dark stools or bright red blood in stools. + pain left shoulder is her only complaint. She does have urgency that is normal/stable. No dysuria. No fever. Occasional chills. Give leukocytosis she is being treated with broad spectrum antibiotics.       Past Medical History  Diagnosis Date  . Acute myocardial infarction, unspecified site, episode of care unspecified   . Congestive heart failure, unspecified   . Coronary atherosclerosis of unspecified type of vessel, native or graft   . Unspecified essential hypertension   . Other and unspecified hyperlipidemia   . Esophageal reflux   . Depressive disorder, not elsewhere classified   . Morbid obesity   . Need for prophylactic vaccination and inoculation against influenza   . Allergic rhinitis, cause unspecified   . Other malaise and fatigue   . Disorder of bone and cartilage, unspecified   . Diverticulosis of colon (without mention of hemorrhage)   . Benign neoplasm of colon   . Gout, unspecified   . Unspecified glaucoma(365.9)   . Osteoarthrosis, unspecified whether generalized or localized,  unspecified site   . Gout flare 02/06/2011  . Anemia, unspecified 04/05/2011    Past Surgical History  Procedure Laterality Date  . Coronary angioplasty with stent placement      RCA stent  . Vesicovaginal fistula closure w/ tah    . Left hip replacement      s/p 2002. Secondary to DJD  . Ectopic pregnancy surgery      Family History  Problem Relation Age of Onset  . Breast cancer Sister   . Hypertension Father   . Stroke Father     Social History:  reports that she quit smoking about 22 years ago. She has never used smokeless tobacco. She reports that she does not drink alcohol or use illicit drugs.  Allergies:  Allergies  Allergen Reactions  . Sulfonamide Derivatives Other (See Comments)    unknown    Medications:  I have reviewed the patient's current medications. Prior to Admission:  Prescriptions prior to admission  Medication Sig Dispense Refill  . allopurinol (ZYLOPRIM) 100 MG tablet Take 1 tablet (100 mg total) by mouth daily.  90 tablet  3  . aspirin (ECOTRIN LOW STRENGTH) 81 MG EC tablet Take 81 mg by mouth daily.        Marland Kitchen docusate sodium (COLACE) 100 MG capsule Take 100 mg by mouth daily as needed for constipation.      . ondansetron (ZOFRAN) 4 MG tablet Take 1 tablet (4 mg total) by mouth every 8 (eight) hours as needed for nausea.  30 tablet  4  . ramipril (  ALTACE) 10 MG capsule Take 10 mg by mouth 2 (two) times daily.      . ranitidine (ZANTAC) 150 MG tablet Take 1 tablet (150 mg total) by mouth daily as needed. Acid reflux  90 tablet  3   Scheduled: . sodium chloride   Intravenous STAT  . allopurinol  100 mg Oral Daily  . aspirin  325 mg Oral Daily  . [START ON 09/11/2012] atorvastatin  80 mg Oral q1800  . famotidine  20 mg Oral BID  . heparin  5,000 Units Subcutaneous Q8H  . morphine      . piperacillin-tazobactam (ZOSYN)  IV  3.375 g Intravenous Q8H  . [START ON 09/11/2012] vancomycin  1,000 mg Intravenous Q24H    Results for orders placed during  the hospital encounter of 09/10/12 (from the past 48 hour(s))  CBC WITH DIFFERENTIAL     Status: Abnormal   Collection Time    09/10/12  1:46 PM      Result Value Range   WBC 19.7 (*) 4.0 - 10.5 K/uL   RBC 3.20 (*) 3.87 - 5.11 MIL/uL   Hemoglobin 10.3 (*) 12.0 - 15.0 g/dL   HCT 40.9 (*) 81.1 - 91.4 %   MCV 100.0  78.0 - 100.0 fL   MCH 32.2  26.0 - 34.0 pg   MCHC 32.2  30.0 - 36.0 g/dL   RDW 78.2 (*) 95.6 - 21.3 %   Platelets 297  150 - 400 K/uL   Neutrophils Relative % 87 (*) 43 - 77 %   Lymphocytes Relative 5 (*) 12 - 46 %   Monocytes Relative 4  3 - 12 %   Eosinophils Relative 0  0 - 5 %   Basophils Relative 0  0 - 1 %   Band Neutrophils 4  0 - 10 %   Neutro Abs 17.9 (*) 1.7 - 7.7 K/uL   Lymphs Abs 1.0  0.7 - 4.0 K/uL   Monocytes Absolute 0.8  0.1 - 1.0 K/uL   Eosinophils Absolute 0.0  0.0 - 0.7 K/uL   Basophils Absolute 0.0  0.0 - 0.1 K/uL   RBC Morphology STOMATOCYTES     Comment: SPHEROCYTES  COMPREHENSIVE METABOLIC PANEL     Status: Abnormal   Collection Time    09/10/12  1:46 PM      Result Value Range   Sodium 145  135 - 145 mEq/L   Potassium 3.9  3.5 - 5.1 mEq/L   Chloride 107  96 - 112 mEq/L   CO2 22  19 - 32 mEq/L   Glucose, Bld 118 (*) 70 - 99 mg/dL   BUN 20  6 - 23 mg/dL   Creatinine, Ser 0.86 (*) 0.50 - 1.10 mg/dL   Calcium 9.8  8.4 - 57.8 mg/dL   Total Protein 7.1  6.0 - 8.3 g/dL   Albumin 3.9  3.5 - 5.2 g/dL   AST 21  0 - 37 U/L   ALT 6  0 - 35 U/L   Alkaline Phosphatase 92  39 - 117 U/L   Total Bilirubin 0.2 (*) 0.3 - 1.2 mg/dL   GFR calc non Af Amer 32 (*) >90 mL/min   GFR calc Af Amer 37 (*) >90 mL/min   Comment:            The eGFR has been calculated     using the CKD EPI equation.     This calculation has not been     validated in all  clinical     situations.     eGFR's persistently     <90 mL/min signify     possible Chronic Kidney Disease.  TROPONIN I     Status: Abnormal   Collection Time    09/10/12  1:46 PM      Result Value Range    Troponin I 5.96 (*) <0.30 ng/mL   Comment:            Due to the release kinetics of cTnI,     a negative result within the first hours     of the onset of symptoms does not rule out     myocardial infarction with certainty.     If myocardial infarction is still suspected,     repeat the test at appropriate intervals.     CRITICAL RESULT CALLED TO, READ BACK BY AND VERIFIED WITH:     CALLED TO K.PRESNELL AT 1430 ON 09/10/12 BY S.ROY  CK     Status: Abnormal   Collection Time    09/10/12  1:46 PM      Result Value Range   Total CK 240 (*) 7 - 177 U/L  URINALYSIS, ROUTINE W REFLEX MICROSCOPIC     Status: Abnormal   Collection Time    09/10/12  2:10 PM      Result Value Range   Color, Urine AMBER (*) YELLOW   Comment: BIOCHEMICALS MAY BE AFFECTED BY COLOR   APPearance CLOUDY (*) CLEAR   Specific Gravity, Urine 1.023  1.005 - 1.030   pH 5.0  5.0 - 8.0   Glucose, UA NEGATIVE  NEGATIVE mg/dL   Hgb urine dipstick NEGATIVE  NEGATIVE   Bilirubin Urine SMALL (*) NEGATIVE   Ketones, ur 15 (*) NEGATIVE mg/dL   Protein, ur 30 (*) NEGATIVE mg/dL   Urobilinogen, UA 1.0  0.0 - 1.0 mg/dL   Nitrite NEGATIVE  NEGATIVE   Leukocytes, UA NEGATIVE  NEGATIVE  URINE MICROSCOPIC-ADD ON     Status: Abnormal   Collection Time    09/10/12  2:10 PM      Result Value Range   Squamous Epithelial / LPF RARE  RARE   WBC, UA 0-2  <3 WBC/hpf   RBC / HPF 0-2  <3 RBC/hpf   Bacteria, UA FEW (*) RARE   Casts HYALINE CASTS (*) NEGATIVE   Comment: GRANULAR CAST   Urine-Other MUCOUS PRESENT     Comment: AMORPHOUS URATES/PHOSPHATES  CG4 I-STAT (LACTIC ACID)     Status: None   Collection Time    09/10/12  3:30 PM      Result Value Range   Lactic Acid, Venous 1.24  0.5 - 2.2 mmol/L  GLUCOSE, CAPILLARY     Status: Abnormal   Collection Time    09/10/12  8:39 PM      Result Value Range   Glucose-Capillary 109 (*) 70 - 99 mg/dL   Comment 1 Notify RN     Comment 2 Documented in Chart    TYPE AND SCREEN      Status: None   Collection Time    09/10/12  8:45 PM      Result Value Range   ABO/RH(D) A POS     Antibody Screen NEG     Sample Expiration 09/13/2012    ABO/RH     Status: None   Collection Time    09/10/12  8:45 PM      Result Value Range   ABO/RH(D) A POS  TROPONIN I     Status: Abnormal   Collection Time    09/10/12  8:55 PM      Result Value Range   Troponin I 6.10 (*) <0.30 ng/mL   Comment:            Due to the release kinetics of cTnI,     a negative result within the first hours     of the onset of symptoms does not rule out     myocardial infarction with certainty.     If myocardial infarction is still suspected,     repeat the test at appropriate intervals.     CRITICAL RESULT CALLED TO, READ BACK BY AND VERIFIED WITH:     Kristine Royal 2141 09/10/12 WBOND  COMPREHENSIVE METABOLIC PANEL     Status: Abnormal   Collection Time    09/10/12  8:56 PM      Result Value Range   Sodium 141  135 - 145 mEq/L   Potassium 5.2 (*) 3.5 - 5.1 mEq/L   Chloride 108  96 - 112 mEq/L   CO2 20  19 - 32 mEq/L   Glucose, Bld 118 (*) 70 - 99 mg/dL   BUN 22  6 - 23 mg/dL   Creatinine, Ser 4.33 (*) 0.50 - 1.10 mg/dL   Calcium 8.8  8.4 - 29.5 mg/dL   Total Protein 6.4  6.0 - 8.3 g/dL   Albumin 3.3 (*) 3.5 - 5.2 g/dL   AST 31  0 - 37 U/L   ALT 7  0 - 35 U/L   Alkaline Phosphatase 79  39 - 117 U/L   Total Bilirubin 0.3  0.3 - 1.2 mg/dL   GFR calc non Af Amer 32 (*) >90 mL/min   GFR calc Af Amer 37 (*) >90 mL/min   Comment:            The eGFR has been calculated     using the CKD EPI equation.     This calculation has not been     validated in all clinical     situations.     eGFR's persistently     <90 mL/min signify     possible Chronic Kidney Disease.  PROTIME-INR     Status: None   Collection Time    09/10/12  8:56 PM      Result Value Range   Prothrombin Time 13.8  11.6 - 15.2 seconds   INR 1.08  0.00 - 1.49  MAGNESIUM     Status: None   Collection Time    09/10/12   8:56 PM      Result Value Range   Magnesium 1.9  1.5 - 2.5 mg/dL  APTT     Status: None   Collection Time    09/10/12  8:56 PM      Result Value Range   aPTT 24  24 - 37 seconds  CBC     Status: Abnormal   Collection Time    09/10/12  8:56 PM      Result Value Range   WBC 15.0 (*) 4.0 - 10.5 K/uL   RBC 3.05 (*) 3.87 - 5.11 MIL/uL   Hemoglobin 9.7 (*) 12.0 - 15.0 g/dL   HCT 18.8 (*) 41.6 - 60.6 %   MCV 97.0  78.0 - 100.0 fL   MCH 31.8  26.0 - 34.0 pg   MCHC 32.8  30.0 - 36.0 g/dL   RDW 30.1 (*) 60.1 - 09.3 %  Platelets 275  150 - 400 K/uL    Dg Chest 1 View  09/10/2012   *RADIOLOGY REPORT*  Clinical Data: Unwitnessed fall last night.  Left humeral fracture.  CHEST - 1 VIEW  Comparison: 06/03/2012.  Findings: There are lower lung volumes and more lordotic positioning currently.  Allowing for this, the heart size and mediastinal contours are stable.  There is increased linear atelectasis at the right lung base.  The left lung appears clear. Mildly comminuted fracture of the left humeral neck is noted.  No other acute osseous findings are identified.  Glenohumeral degenerative changes are present bilaterally.  IMPRESSION:  Low lung volumes with resulting right basilar atelectasis.  Left humeral neck fracture.   Original Report Authenticated By: Carey Bullocks, M.D.   Dg Hip Complete Right  09/10/2012   *RADIOLOGY REPORT*  Clinical Data: Larey Seat yesterday.  Right hip pain.  RIGHT HIP - COMPLETE 2+ VIEW  Comparison: None.  Findings: No evidence of acute fracture or dislocation.  Severe joint space narrowing with associated hypertrophic spurring involving the acetabulum and the femoral head.  Moderate osseous demineralization.  Included AP pelvis demonstrates a prior left total hip arthroplasty with anatomic alignment and no complicating features.  Symphysis pubis intact.  Sacroiliac joints intact with degenerative changes. Degenerative changes involving the visualized lower lumbar spine.   IMPRESSION: No acute osseous abnormality.  Severe osteoarthritis involving the right hip.   Original Report Authenticated By: Hulan Saas, M.D.   Ct Head Wo Contrast  09/10/2012   *RADIOLOGY REPORT*  Clinical Data: Fall  CT HEAD WITHOUT CONTRAST  Technique:  Contiguous axial images were obtained from the base of the skull through the vertex without contrast.  Comparison: none  Findings: Generalized atrophy.  Chronic microvascular ischemic change in the white matter.  Negative for acute infarct.  Negative for hemorrhage mass or skull fracture.  Sinusitis with air-fluid levels in the left ethmoid and sphenoid sinuses as well as chronic mucosal thickening and bony thickening of the left ethmoid and sphenoid sinus.  IMPRESSION: Atrophy and chronic microvascular ischemic change.  No acute intracranial abnormality.  Sinusitis.   Original Report Authenticated By: Janeece Riggers, M.D.   Dg Shoulder Left  09/10/2012   *RADIOLOGY REPORT*  Clinical Data: Shoulder pain status post fall.  LEFT SHOULDER - 2+ VIEW  Comparison: None.  Findings: There is a comminuted and mildly displaced acute fracture involving the left humeral neck.  No involvement of the humeral head articular surface is identified.  There is no dislocation. There are underlying advanced glenohumeral degenerative changes. The subacromial space is preserved.  IMPRESSION: Comminuted fracture of the left humeral neck.  Severe underlying glenohumeral degenerative changes.   Original Report Authenticated By: Carey Bullocks, M.D.   Dg Humerus Left  09/10/2012   *RADIOLOGY REPORT*  Clinical Data: Fall yesterday.  Left humeral neck fracture identified on earlier shoulder imaging.  LEFT HUMERUS - 2+ VIEW  Comparison: Left shoulder x-rays obtained earlier same date.  Findings: Comminuted impacted fracture involving the humeral neck as noted on the shoulder x-rays.  No fractures elsewhere involving the humerus.  Elbow joint intact with well-preserved joint spaces.  Generalized osseous demineralization.  IMPRESSION: Left humeral neck fracture as noted on the earlier shoulder imaging.  No fractures elsewhere involving the left humerus.   Original Report Authenticated By: Hulan Saas, M.D.   Dg Hand Complete Left  09/10/2012   *RADIOLOGY REPORT*  Clinical Data: Hand injury status post fall last night.  LEFT HAND - COMPLETE 3+  VIEW  Comparison: None.  Findings: The bones appear mildly demineralized.  There is no evidence of acute fracture or dislocation.  There are moderately advanced degenerative changes at the first carpal metacarpal articulation. Mild degenerative changes are noted throughout the metacarpal phalangeal joints.  No focal soft tissue swelling is evident.  IMPRESSION: No acute osseous findings.  Degenerative changes as described.   Original Report Authenticated By: Carey Bullocks, M.D.    Review of Systems  Constitutional: Positive for malaise/fatigue. Negative for fever and chills.  HENT: Positive for hearing loss. Negative for ear pain and tinnitus.   Eyes: Negative for double vision, photophobia and pain.  Respiratory: Negative for cough, hemoptysis and sputum production.   Cardiovascular: Negative for chest pain, palpitations, orthopnea and claudication.  Gastrointestinal: Negative for nausea, vomiting and abdominal pain.  Genitourinary: Positive for urgency. Negative for dysuria and hematuria.  Musculoskeletal: Positive for joint pain and falls. Negative for myalgias.  Skin: Negative for itching.  Neurological: Negative for dizziness, tingling, tremors and headaches.  Endo/Heme/Allergies: Negative for environmental allergies. Does not bruise/bleed easily.  Psychiatric/Behavioral: Negative for depression, suicidal ideas and substance abuse.   Blood pressure 105/63, pulse 100, temperature 98.5 F (36.9 C), temperature source Oral, resp. rate 18, height 5\' 2"  (1.575 m), weight 94.6 kg (208 lb 8.9 oz), SpO2 99.00%. Physical Exam  Nursing  note and vitals reviewed. Constitutional: She is oriented to person, place, and time. She appears well-developed and well-nourished. No distress.  HENT:  Head: Normocephalic and atraumatic.  Nose: Nose normal.  Mouth/Throat: Oropharynx is clear and moist. No oropharyngeal exudate.  Eyes: Conjunctivae and EOM are normal. Pupils are equal, round, and reactive to light. No scleral icterus.  Neck: Normal range of motion. Neck supple. No tracheal deviation present. No thyromegaly present.  Cardiovascular: Regular rhythm, normal heart sounds and intact distal pulses.  Exam reveals no gallop.   No murmur heard. tachycardic  Respiratory: Effort normal and breath sounds normal. No respiratory distress. She has no wheezes.  GI: Soft. Bowel sounds are normal. She exhibits no distension. There is no tenderness.  Musculoskeletal: Normal range of motion. She exhibits edema. She exhibits no tenderness.  Trace edema in ankles  Neurological: She is alert and oriented to person, place, and time. No cranial nerve deficit.  Skin: Skin is warm and dry. No rash noted. No erythema.  Psychiatric: She has a normal mood and affect. Her behavior is normal.   Labs reviewed; wbc 19.7, h/h 10.7/32, plt 297 Na 145, K 3.9 --> 5.2, bun/cr 20/1.4 Troponin 5.96 --> 6.1 ECG: inferior ST depression slightly; V2-V6 with ST flattening/twi, NSR, prolonged QT I reviewed the notes from 2001 LHC - she had restenosis of RCA stent with 50% mLAD lesion, 25% LCx and no further LHC since  Problem List Fall/left humeral neck fracture Elevated troponin Leukocytosis Anemia Known CAD Dyslipidemia Chronic Kidney Disease Stage III+  Assessment/Plan: 79 yo woman with PMH of CAD, dyslipidemia, CKD who had a fall at assisted living and ultimately with left humeral neck fracture. Troponin elevation also found. Differential diagnosis is demand ischemia vs. Type I nstemi. I favor a diagnosis of type II nstemi/demand ischemia given no chest  pain and in setting of fall/left humeral neck fracture and leukocytosis (infection). At this juncture, she's on aspirin, she had 324 mg today and she had a fall with a humeral neck fracture. We need orthopaedic input to assess need for surgery. Current troponin x2 elevated but not rising and falling c/w true definition. CTH  negative. Will trend troponins, defer initial therapy with heparin for now. Will obtain echo in AM to assist in further risk stratification and decision-making. NPO after MN for any procedures per primary team.  - trending enzymes, on aspirin/statin for now, start low dose metoprolol  - would also start low dose beta-blocker, metoprolol 12.5 mg bid - aspirin 324 mg already given; 81 mg PO daily now - atorvastatin 80 mg qHS - review echo ordered by primary team in AM  Jalin Alicea 09/10/2012, 10:47 PM

## 2012-09-10 NOTE — Progress Notes (Signed)
NOtified by lab of critical Troponin 6.1  Result texted to Donnamarie Poag, PA

## 2012-09-10 NOTE — H&P (Addendum)
[facility] History and Physical  Beth Gutierrez  NWG:[id number]  DOB: Aug 27, 1921  DOA: 09/10/2012  Referring physician: Dr Arthor Captain PCP: Oliver Barre, MD  Specialists: Dr Tresa Endo. cardiology  Chief Complaint: fall  HPI: Beth Gutierrez is a 79 y.o. female with Past medical history of hypertension, CAD, GERD, Dyslipidemia, who is from an assisted living facility, presented with complain of a fall that happen last night. The pt was at her room and was getting her clothes from the closed and she fell down, she does not think she passed out or felt dizzy or had palpitation with that condition. She also denies any prior symptoms and mention that she was at her baseline. At present she does not have any fever, chills, headache, nausea, vomiting, abdominal pain, diarrhea, constipation, active bleeding, chest pain, palpitation, dizziness, pedal edema, orthopnea, PND, focal neurological deficit, burning urination, cough, shortness of breath. She does have some pain in the left shoulder and feels tired.   Review of Systems: as mentioned in the history of present illness.  A Comprehensive review of the other systems is negative.  Past Medical History  Diagnosis Date  . Acute myocardial infarction, unspecified site, episode of care unspecified   . Congestive heart failure, unspecified   . Coronary atherosclerosis of unspecified type of vessel, native or graft   . Unspecified essential hypertension   . Other and unspecified hyperlipidemia   . Esophageal reflux   . Depressive disorder, not elsewhere classified   . Morbid obesity   . Need for prophylactic vaccination and inoculation against influenza   . Allergic rhinitis, cause unspecified   . Other malaise and fatigue   . Disorder of bone and cartilage, unspecified   . Diverticulosis of colon (without mention of hemorrhage)   . Benign neoplasm of colon   . Gout, unspecified   . Unspecified glaucoma(365.9)   . Osteoarthrosis,  unspecified whether generalized or localized, unspecified site   . Gout flare 02/06/2011  . Anemia, unspecified 04/05/2011   Past Surgical History  Procedure Laterality Date  . Coronary angioplasty with stent placement      RCA stent  . Vesicovaginal fistula closure w/ tah    . Left hip replacement      s/p 2002. Secondary to DJD  . Ectopic pregnancy surgery     Social History:  reports that she quit smoking about 22 years ago. She has never used smokeless tobacco. She reports that she does not drink alcohol or use illicit drugs. Patient is coming from ALF. Patient can participate in ADLs.  Allergies  Allergen Reactions  . Sulfonamide Derivatives Other (See Comments)    unknown    Family History  Problem Relation Age of Onset  . Breast cancer Sister   . Hypertension Father   . Stroke Father     Prior to Admission medications   Medication Sig Start Date End Date Taking? Authorizing Provider  allopurinol (ZYLOPRIM) 100 MG tablet Take 1 tablet (100 mg total) by mouth daily. 06/03/12  Yes Corwin Levins, MD  aspirin (ECOTRIN LOW STRENGTH) 81 MG EC tablet Take 81 mg by mouth daily.     Yes Historical Provider, MD  docusate sodium (COLACE) 100 MG capsule Take 100 mg by mouth daily as needed for constipation.   Yes Historical Provider, MD  ondansetron (ZOFRAN) 4 MG tablet Take 1 tablet (4 mg total) by mouth every 8 (eight) hours as needed for nausea. 08/23/12  Yes Corwin Levins, MD  ramipril (ALTACE) 10  MG capsule Take 10 mg by mouth 2 (two) times daily.   Yes Historical Provider, MD  ranitidine (ZANTAC) 150 MG tablet Take 1 tablet (150 mg total) by mouth daily as needed. Acid reflux 06/07/12  Yes Corwin Levins, MD    Physical Exam: Filed Vitals:   09/10/12 1515 09/10/12 1526 09/10/12 1710 09/10/12 1846  BP: 110/61  95/57 111/57  Pulse: 95  93 100  Temp:  98.3 F (36.8 C)  99.1 F (37.3 C)  TempSrc:  Rectal  Oral  Resp:   16 18  Height:    5\' 2"  (1.575 m)  Weight:    94.6 kg (208  lb 8.9 oz)  SpO2: 99%  100% 99%    General: Alert, Awake and Oriented to Time, Place and Person. Appear in mild distress Eyes: PERRL ENT: Oral Mucosa clear dry. Neck: No JVD, no Carotid Bruits, no Stiffness Cardiovascular: S1 and S2 Present, Murmur absent, Peripheral Pulses Present Respiratory: Clear to Auscultation, Bilateral Air entry equal and Decreased, minimal basal crackles occasionally heard Abdomen: Bowel Sound Present, Soft and Non tender, no Organomegaly Extremities: Pedal edema no, calf tenderness no. Significant pain on movement at the left upper extremity, pulses present, sensation present. Neurologic: Mental status, Motor strength, Sensation, reflexes, Proprioception Grossly Unremarkable.  Labs on Admission:  Basic Metabolic Panel:  Recent Labs Lab 09/10/12 1346  NA 145  K 3.9  CL 107  CO2 22  GLUCOSE 118*  BUN 20  CREATININE 1.40*  CALCIUM 9.8   Liver Function Tests:  Recent Labs Lab 09/10/12 1346  AST 21  ALT 6  ALKPHOS 92  BILITOT 0.2*  PROT 7.1  ALBUMIN 3.9   No results found for this basename: LIPASE, AMYLASE,  in the last 168 hours No results found for this basename: AMMONIA,  in the last 168 hours CBC:  Recent Labs Lab 09/10/12 1346  WBC 19.7*  NEUTROABS 17.9*  HGB 10.3*  HCT 32.0*  MCV 100.0  PLT 297   Cardiac Enzymes:  Recent Labs Lab 09/10/12 1346  CKTOTAL 240*  TROPONINI 5.96*    BNP (last 3 results) No results found for this basename: PROBNP,  in the last 8760 hours CBG: No results found for this basename: GLUCAP,  in the last 168 hours  Radiological Exams on Admission: Dg Chest 1 View  09/10/2012   *RADIOLOGY REPORT*  Clinical Data: Unwitnessed fall last night.  Left humeral fracture.  CHEST - 1 VIEW  Comparison: 06/03/2012.  Findings: There are lower lung volumes and more lordotic positioning currently.  Allowing for this, the heart size and mediastinal contours are stable.  There is increased linear atelectasis at the  right lung base.  The left lung appears clear. Mildly comminuted fracture of the left humeral neck is noted.  No other acute osseous findings are identified.  Glenohumeral degenerative changes are present bilaterally.  IMPRESSION:  Low lung volumes with resulting right basilar atelectasis.  Left humeral neck fracture.   Original Report Authenticated By: Carey Bullocks, M.D.   Dg Hip Complete Right  09/10/2012   *RADIOLOGY REPORT*  Clinical Data: Larey Seat yesterday.  Right hip pain.  RIGHT HIP - COMPLETE 2+ VIEW  Comparison: None.  Findings: No evidence of acute fracture or dislocation.  Severe joint space narrowing with associated hypertrophic spurring involving the acetabulum and the femoral head.  Moderate osseous demineralization.  Included AP pelvis demonstrates a prior left total hip arthroplasty with anatomic alignment and no complicating features.  Symphysis pubis intact.  Sacroiliac joints intact with degenerative changes. Degenerative changes involving the visualized lower lumbar spine.  IMPRESSION: No acute osseous abnormality.  Severe osteoarthritis involving the right hip.   Original Report Authenticated By: Hulan Saas, M.D.   Ct Head Wo Contrast  09/10/2012   *RADIOLOGY REPORT*  Clinical Data: Fall  CT HEAD WITHOUT CONTRAST  Technique:  Contiguous axial images were obtained from the base of the skull through the vertex without contrast.  Comparison: none  Findings: Generalized atrophy.  Chronic microvascular ischemic change in the white matter.  Negative for acute infarct.  Negative for hemorrhage mass or skull fracture.  Sinusitis with air-fluid levels in the left ethmoid and sphenoid sinuses as well as chronic mucosal thickening and bony thickening of the left ethmoid and sphenoid sinus.  IMPRESSION: Atrophy and chronic microvascular ischemic change.  No acute intracranial abnormality.  Sinusitis.   Original Report Authenticated By: Janeece Riggers, M.D.   Dg Shoulder Left  09/10/2012    *RADIOLOGY REPORT*  Clinical Data: Shoulder pain status post fall.  LEFT SHOULDER - 2+ VIEW  Comparison: None.  Findings: There is a comminuted and mildly displaced acute fracture involving the left humeral neck.  No involvement of the humeral head articular surface is identified.  There is no dislocation. There are underlying advanced glenohumeral degenerative changes. The subacromial space is preserved.  IMPRESSION: Comminuted fracture of the left humeral neck.  Severe underlying glenohumeral degenerative changes.   Original Report Authenticated By: Carey Bullocks, M.D.   Dg Humerus Left  09/10/2012   *RADIOLOGY REPORT*  Clinical Data: Fall yesterday.  Left humeral neck fracture identified on earlier shoulder imaging.  LEFT HUMERUS - 2+ VIEW  Comparison: Left shoulder x-rays obtained earlier same date.  Findings: Comminuted impacted fracture involving the humeral neck as noted on the shoulder x-rays.  No fractures elsewhere involving the humerus.  Elbow joint intact with well-preserved joint spaces. Generalized osseous demineralization.  IMPRESSION: Left humeral neck fracture as noted on the earlier shoulder imaging.  No fractures elsewhere involving the left humerus.   Original Report Authenticated By: Hulan Saas, M.D.   Dg Hand Complete Left  09/10/2012   *RADIOLOGY REPORT*  Clinical Data: Hand injury status post fall last night.  LEFT HAND - COMPLETE 3+ VIEW  Comparison: None.  Findings: The bones appear mildly demineralized.  There is no evidence of acute fracture or dislocation.  There are moderately advanced degenerative changes at the first carpal metacarpal articulation. Mild degenerative changes are noted throughout the metacarpal phalangeal joints.  No focal soft tissue swelling is evident.  IMPRESSION: No acute osseous findings.  Degenerative changes as described.   Original Report Authenticated By: Carey Bullocks, M.D.    EKG: Independently reviewed. No ST-T wave changes suggestive of  acute ischemia.  Assessment/Plan Principal Problem:   Elevated troponin Active Problems:   SIRS (systemic inflammatory response syndrome)   Hypotension   Fall   Closed fracture of unspecified part of upper end of humerus   NSTEMI (non-ST elevated myocardial infarction)   1. Non-STEMI The patient has elevated troponin without any signs of acute ischemia on the EKG or chest pain or shortness of breath or any other angina equivalent symptoms. She does have history of coronary artery disease. I could likely be Demand ischemia from stress after the fall. Cardiology consulted, Dr. Tresa Endo will see the patient and make recommendation regarding heparin for therapeutic anticoagulation.  Patient given full dose aspirin, Lipitor 80. Discussed with orthopedic Dr. Ave Filter on phone for clearance for anticoagulation.  Telemetry, EKG tomorrow, severe troponins, 2-D echocardiogram. Further management as per cardiology  2. Hypotension and sirs. Patient had hypotension, tachycardia, but denies any complaint of active infection. Urinalysis is clear chest x-ray shows mild atelectasis versus congestion. Patient does not have any fever. Patient had been started on IV antibiotics and the other facility. At present we will continue broad-spectrum IV antibiotics as infection could have precipitated the fall.  Antibiotics can be discontinued or narrowed if the workup is negative for any infection. Potential cause of hypotension could be medication- Ramipril or pain medications or vasovagal. Cardiogenic shock is also potential cause, and would defer to cardiology for further workup. At present. Using the rate for hydration for maintenance fluid only.  3. Left humerus fracture acute Patient is in a sling, orthopedic consulted, pain management ordered, bowel regimen ordered,  No signs of neurovascular damage at present.  4. Fall: Etiology is unclear, patient denies any symptoms before or off for the fall. She  remembers the whole event and has not passed out.  She denies any change in her baseline status since last couple of days. PTOT consult  DVT Prophylaxis: Therapeutic heparin Nutrition: N.p.o. and once the cardiac tomorrow  Code Status: Full code  Family Communication: Plan Discussed with granddaughter and caretaker over the phone   Author: Lynden Oxford, MD [facility] Pager: [phone] 09/10/2012 8:20 PM    If 7PM-7AM, please contact night-coverage [email or web address] Password [email or web address]

## 2012-09-10 NOTE — ED Notes (Signed)
MD at bedside. 

## 2012-09-10 NOTE — ED Notes (Signed)
Patient changed into gown.

## 2012-09-10 NOTE — Progress Notes (Signed)
ANTIBIOTIC CONSULT NOTE - INITIAL  Pharmacy Consult for Vanco/Zosyn Indication: rule out sepsis vs PNA  Allergies  Allergen Reactions  . Sulfonamide Derivatives Other (See Comments)    unknown    Patient Measurements: Height: 5\' 2"  (157.5 cm) Weight: 208 lb 8.9 oz (94.6 kg) IBW/kg (Calculated) : 50.1 Adjusted Body Weight:    Vital Signs: Temp: 99.1 F (37.3 C) (07/29 1846) Temp src: Oral (07/29 1846) BP: 111/57 mmHg (07/29 1846) Pulse Rate: 100 (07/29 1846) Intake/Output from previous day:   Intake/Output from this shift:    Labs:  Recent Labs  09/10/12 1346  WBC 19.7*  HGB 10.3*  PLT 297  CREATININE 1.40*   Estimated Creatinine Clearance: 29.2 ml/min (by C-G formula based on Cr of 1.4). No results found for this basename: VANCOTROUGH, VANCOPEAK, VANCORANDOM, GENTTROUGH, GENTPEAK, GENTRANDOM, TOBRATROUGH, TOBRAPEAK, TOBRARND, AMIKACINPEAK, AMIKACINTROU, AMIKACIN,  in the last 72 hours   Microbiology: No results found for this or any previous visit (from the past 720 hour(s)).  Medical History: Past Medical History  Diagnosis Date  . Acute myocardial infarction, unspecified site, episode of care unspecified   . Congestive heart failure, unspecified   . Coronary atherosclerosis of unspecified type of vessel, native or graft   . Unspecified essential hypertension   . Other and unspecified hyperlipidemia   . Esophageal reflux   . Depressive disorder, not elsewhere classified   . Morbid obesity   . Need for prophylactic vaccination and inoculation against influenza   . Allergic rhinitis, cause unspecified   . Other malaise and fatigue   . Disorder of bone and cartilage, unspecified   . Diverticulosis of colon (without mention of hemorrhage)   . Benign neoplasm of colon   . Gout, unspecified   . Unspecified glaucoma(365.9)   . Osteoarthrosis, unspecified whether generalized or localized, unspecified site   . Gout flare 02/06/2011  . Anemia, unspecified  04/05/2011    Medications:  Prescriptions prior to admission  Medication Sig Dispense Refill  . allopurinol (ZYLOPRIM) 100 MG tablet Take 1 tablet (100 mg total) by mouth daily.  90 tablet  3  . aspirin (ECOTRIN LOW STRENGTH) 81 MG EC tablet Take 81 mg by mouth daily.        Marland Kitchen docusate sodium (COLACE) 100 MG capsule Take 100 mg by mouth daily as needed for constipation.      . ondansetron (ZOFRAN) 4 MG tablet Take 1 tablet (4 mg total) by mouth every 8 (eight) hours as needed for nausea.  30 tablet  4  . ramipril (ALTACE) 10 MG capsule Take 10 mg by mouth 2 (two) times daily.      . ranitidine (ZANTAC) 150 MG tablet Take 1 tablet (150 mg total) by mouth daily as needed. Acid reflux  90 tablet  3   Beth Gutierrez: [name] found after fall at her independent living facility with L humeral fracture. Complex PMH.  Troponin 5.96 elevated. WBC elevated 19.7. Tmax 99.1. Scr 1.4 with estimated CrCl 30. Plan to start abx for r/o PNA vs sepsis?  Goal of Therapy:  Vancomycin trough level 15-20 mcg/ml  Plan:  Zosyn 3.375g IV q8 hrs. Vanco 1g IV q24h (started in ER tonight) Vanco trough after 3-5 doses at steady state.   Beth Gutierrez 09/10/2012,8:03 PM

## 2012-09-10 NOTE — ED Notes (Addendum)
At 1330, EDP Ray informed of VS, ordered 1000cc NS bolus vs 500cc

## 2012-09-10 NOTE — ED Notes (Signed)
EDP Ray notified of elevated troponin

## 2012-09-10 NOTE — ED Notes (Signed)
EDP notified of difficulty obtaining blood cultures due to poor patient vein access. Attempted to pull blood back from IV site and was unsuccessful. RT at the bedside to do arterial stick for blood culture.

## 2012-09-11 ENCOUNTER — Encounter (HOSPITAL_COMMUNITY)
Admission: EM | Disposition: A | Discharge status: Skilled Nursing Facility | Payer: Self-pay | Source: Home / Self Care | Attending: Internal Medicine

## 2012-09-11 DIAGNOSIS — I2510 Atherosclerotic heart disease of native coronary artery without angina pectoris: Secondary | ICD-10-CM

## 2012-09-11 DIAGNOSIS — I369 Nonrheumatic tricuspid valve disorder, unspecified: Secondary | ICD-10-CM

## 2012-09-11 DIAGNOSIS — I214 Non-ST elevation (NSTEMI) myocardial infarction: Secondary | ICD-10-CM

## 2012-09-11 DIAGNOSIS — Z01811 Encounter for preprocedural respiratory examination: Secondary | ICD-10-CM | POA: Insufficient documentation

## 2012-09-11 DIAGNOSIS — R7989 Other specified abnormal findings of blood chemistry: Secondary | ICD-10-CM

## 2012-09-11 DIAGNOSIS — W19XXXA Unspecified fall, initial encounter: Secondary | ICD-10-CM

## 2012-09-11 DIAGNOSIS — S42209A Unspecified fracture of upper end of unspecified humerus, initial encounter for closed fracture: Secondary | ICD-10-CM

## 2012-09-11 DIAGNOSIS — N182 Chronic kidney disease, stage 2 (mild): Secondary | ICD-10-CM

## 2012-09-11 HISTORY — PX: LEFT HEART CATHETERIZATION WITH CORONARY ANGIOGRAM: SHX5451

## 2012-09-11 LAB — CBC
HCT: 28.6 % — ABNORMAL LOW (ref 36.0–46.0)
Hemoglobin: 8.7 g/dL — ABNORMAL LOW (ref 12.0–15.0)
MCH: 31.9 pg (ref 26.0–34.0)
MCHC: 32.7 g/dL (ref 30.0–36.0)
MCHC: 32.2 g/dL (ref 30.0–36.0)
Platelets: 280 10*3/uL (ref 150–400)
RDW: 16.6 % — ABNORMAL HIGH (ref 11.5–15.5)

## 2012-09-11 LAB — TROPONIN I
Troponin I: 7.09 ng/mL (ref <0.30)
Troponin I: 5.16 ng/mL (ref <0.30)

## 2012-09-11 LAB — GLUCOSE, CAPILLARY
Glucose-Capillary: 100 mg/dL — ABNORMAL HIGH (ref 70–99)
Glucose-Capillary: 103 mg/dL — ABNORMAL HIGH (ref 70–99)
Glucose-Capillary: 94 mg/dL (ref 70–99)

## 2012-09-11 LAB — CREATININE, SERUM: GFR calc non Af Amer: 27 mL/min — ABNORMAL LOW (ref >90)

## 2012-09-11 SURGERY — LEFT HEART CATHETERIZATION WITH CORONARY ANGIOGRAM
Anesthesia: LOCAL

## 2012-09-11 MED ORDER — ASPIRIN 81 MG PO CHEW
324.0000 mg | CHEWABLE_TABLET | ORAL | Status: DC
  Filled 2012-09-11: qty 4

## 2012-09-11 MED ORDER — FENTANYL CITRATE 0.05 MG/ML IJ SOLN
INTRAMUSCULAR | Status: AC
  Filled 2012-09-11: qty 2

## 2012-09-11 MED ORDER — SODIUM CHLORIDE 0.9 % IV SOLN
INTRAVENOUS | Status: DC
  Administered 2012-09-11: 11:00:00 via INTRAVENOUS

## 2012-09-11 MED ORDER — NITROGLYCERIN 0.2 MG/ML ON CALL CATH LAB
INTRAVENOUS | Status: AC
  Filled 2012-09-11: qty 1

## 2012-09-11 MED ORDER — ASPIRIN 81 MG PO CHEW
324.0000 mg | CHEWABLE_TABLET | ORAL | Status: AC
  Administered 2012-09-11: 324 mg via ORAL

## 2012-09-11 MED ORDER — FAMOTIDINE 20 MG PO TABS
20.0000 mg | ORAL_TABLET | Freq: Every day | ORAL | Status: DC
  Administered 2012-09-11 – 2012-09-16 (×6): 20 mg via ORAL
  Filled 2012-09-11 (×6): qty 1

## 2012-09-11 MED ORDER — SODIUM CHLORIDE 0.9 % IJ SOLN
3.0000 mL | INTRAMUSCULAR | Status: DC | PRN
  Administered 2012-09-12: 3 mL via INTRAVENOUS

## 2012-09-11 MED ORDER — HEPARIN (PORCINE) IN NACL 2-0.9 UNIT/ML-% IJ SOLN
INTRAMUSCULAR | Status: AC
  Filled 2012-09-11: qty 1000

## 2012-09-11 MED ORDER — SODIUM CHLORIDE 0.9 % IV SOLN: 250.0000 mL | INTRAVENOUS | Status: DC | PRN

## 2012-09-11 MED ORDER — SODIUM CHLORIDE 0.9 % IJ SOLN: 3.0000 mL | INTRAMUSCULAR | Status: DC | PRN

## 2012-09-11 MED ORDER — HEPARIN SODIUM (PORCINE) 5000 UNIT/ML IJ SOLN
5000.0000 [IU] | Freq: Three times a day (TID) | INTRAMUSCULAR | Status: DC
  Administered 2012-09-11 – 2012-09-16 (×15): 5000 [IU] via SUBCUTANEOUS
  Filled 2012-09-11 (×18): qty 1

## 2012-09-11 MED ORDER — MORPHINE SULFATE 2 MG/ML IJ SOLN
1.0000 mg | INTRAMUSCULAR | Status: DC | PRN
  Administered 2012-09-12 (×3): 2 mg via INTRAVENOUS
  Filled 2012-09-11: qty 2
  Filled 2012-09-11 (×3): qty 1

## 2012-09-11 MED ORDER — LIDOCAINE HCL (PF) 1 % IJ SOLN
INTRAMUSCULAR | Status: AC
  Filled 2012-09-11: qty 30

## 2012-09-11 MED ORDER — ACETAMINOPHEN 325 MG PO TABS: 650.0000 mg | ORAL_TABLET | ORAL | Status: DC | PRN

## 2012-09-11 MED ORDER — MIDAZOLAM HCL 2 MG/2ML IJ SOLN
INTRAMUSCULAR | Status: AC
  Filled 2012-09-11: qty 2

## 2012-09-11 MED ORDER — SODIUM CHLORIDE 0.9 % IJ SOLN
3.0000 mL | Freq: Two times a day (BID) | INTRAMUSCULAR | Status: DC
  Administered 2012-09-11 – 2012-09-15 (×9): 3 mL via INTRAVENOUS

## 2012-09-11 MED ORDER — ONDANSETRON HCL 4 MG/2ML IJ SOLN: 4.0000 mg | Freq: Four times a day (QID) | INTRAMUSCULAR | Status: DC | PRN

## 2012-09-11 MED ORDER — MORPHINE SULFATE 10 MG/ML IJ SOLN: 2.0000 mg | Freq: Once | INTRAMUSCULAR | Status: DC

## 2012-09-11 MED ORDER — ASPIRIN 81 MG PO CHEW
81.0000 mg | CHEWABLE_TABLET | Freq: Every day | ORAL | Status: DC
  Administered 2012-09-11 – 2012-09-16 (×6): 81 mg via ORAL
  Filled 2012-09-11 (×7): qty 1

## 2012-09-11 MED ORDER — SODIUM CHLORIDE 0.9 % IJ SOLN
3.0000 mL | Freq: Two times a day (BID) | INTRAMUSCULAR | Status: DC
  Administered 2012-09-11: 3 mL via INTRAVENOUS

## 2012-09-11 MED ORDER — MORPHINE SULFATE 10 MG/ML IJ SOLN
INTRAMUSCULAR | Status: AC
  Filled 2012-09-11: qty 1

## 2012-09-11 MED ORDER — FUROSEMIDE 10 MG/ML IJ SOLN
40.0000 mg | Freq: Two times a day (BID) | INTRAMUSCULAR | Status: AC
  Administered 2012-09-11 – 2012-09-12 (×2): 40 mg via INTRAVENOUS
  Filled 2012-09-11 (×2): qty 4

## 2012-09-11 NOTE — Progress Notes (Signed)
Utilization review completed.  P.J. Jazia Faraci,RN,BSN Case Manager [phone]  

## 2012-09-11 NOTE — H&P (View-Only) (Signed)
Patient ID: Beth Gutierrez, female   DOB: [birth year], 79 y.o.   MRN: [id number]    Subjective:  Denies SSCP, palpitations or Dyspnea She has had a lot of belching and GI symptoms the last 3 months  Ortho has not seen yet  Objective:  Filed Vitals:   09/11/12 0000 09/11/12 0400 09/11/12 0448 09/11/12 0600  BP: 102/56 92/60    Pulse: 90 78 82   Temp: 98.4 F (36.9 C) 98 F (36.7 C)    TempSrc: Oral Oral    Resp: 21  21   Height:      Weight:   208 lb 5.4 oz (94.5 kg)   SpO2: 97% 98% 97% 100%    Intake/Output from previous day:  Intake/Output Summary (Last 24 hours) at 09/11/12 0744 Last data filed at 09/11/12 0700  Gross per 24 hour  Intake    900 ml  Output    300 ml  Net    600 ml    Physical Exam: Affect appropriate Obese female HEENT: normal Neck supple with no adenopathy JVP normal no bruits no thyromegaly Lungs basilar crackles with no wheezing  Heart:  S1/S2 no murmur, no rub, gallop or click PMI normal Abdomen: benighn, BS positve, no tenderness, no AAA no bruit.  No HSM or HJR Distal pulses intact with no bruits No edema Neuro non-focal Skin warm and dry Left humeral neck fracture    Lab Results: Basic Metabolic Panel:  Recent Labs  69/62/95 1346 09/10/12 2056  NA 145 141  K 3.9 5.2*  CL 107 108  CO2 22 20  GLUCOSE 118* 118*  BUN 20 22  CREATININE 1.40* 1.40*  CALCIUM 9.8 8.8  MG  --  1.9   Liver Function Tests:  Recent Labs  09/10/12 1346 09/10/12 2056  AST 21 31  ALT 6 7  ALKPHOS 92 79  BILITOT 0.2* 0.3  PROT 7.1 6.4  ALBUMIN 3.9 3.3*   CBC:  Recent Labs  09/10/12 1346 09/10/12 2056  WBC 19.7* 15.0*  NEUTROABS 17.9*  --   HGB 10.3* 9.7*  HCT 32.0* 29.6*  MCV 100.0 97.0  PLT 297 275   Cardiac Enzymes:  Recent Labs  09/10/12 1346 09/10/12 2055 09/11/12 0255  CKTOTAL 240*  --   --   TROPONINI 5.96* 6.10* 7.09*   Hemoglobin A1C:  Recent Labs  09/10/12 2056  HGBA1C 5.2   Thyroid Function  Tests:  Recent Labs  09/10/12 2056  TSH 0.889    Imaging: Dg Chest 1 View  09/10/2012   *RADIOLOGY REPORT*  Clinical Data: Unwitnessed fall last night.  Left humeral fracture.  CHEST - 1 VIEW  Comparison: 06/03/2012.  Findings: There are lower lung volumes and more lordotic positioning currently.  Allowing for this, the heart size and mediastinal contours are stable.  There is increased linear atelectasis at the right lung base.  The left lung appears clear. Mildly comminuted fracture of the left humeral neck is noted.  No other acute osseous findings are identified.  Glenohumeral degenerative changes are present bilaterally.  IMPRESSION:  Low lung volumes with resulting right basilar atelectasis.  Left humeral neck fracture.   Original Report Authenticated By: Carey Bullocks, M.D.   Dg Hip Complete Right  09/10/2012   *RADIOLOGY REPORT*  Clinical Data: Larey Seat yesterday.  Right hip pain.  RIGHT HIP - COMPLETE 2+ VIEW  Comparison: None.  Findings: No evidence of acute fracture or dislocation.  Severe joint space narrowing with associated hypertrophic spurring involving  the acetabulum and the femoral head.  Moderate osseous demineralization.  Included AP pelvis demonstrates a prior left total hip arthroplasty with anatomic alignment and no complicating features.  Symphysis pubis intact.  Sacroiliac joints intact with degenerative changes. Degenerative changes involving the visualized lower lumbar spine.  IMPRESSION: No acute osseous abnormality.  Severe osteoarthritis involving the right hip.   Original Report Authenticated By: Hulan Saas, M.D.   Ct Head Wo Contrast  09/10/2012   *RADIOLOGY REPORT*  Clinical Data: Fall  CT HEAD WITHOUT CONTRAST  Technique:  Contiguous axial images were obtained from the base of the skull through the vertex without contrast.  Comparison: none  Findings: Generalized atrophy.  Chronic microvascular ischemic change in the white matter.  Negative for acute infarct.   Negative for hemorrhage mass or skull fracture.  Sinusitis with air-fluid levels in the left ethmoid and sphenoid sinuses as well as chronic mucosal thickening and bony thickening of the left ethmoid and sphenoid sinus.  IMPRESSION: Atrophy and chronic microvascular ischemic change.  No acute intracranial abnormality.  Sinusitis.   Original Report Authenticated By: Janeece Riggers, M.D.   Dg Shoulder Left  09/10/2012   *RADIOLOGY REPORT*  Clinical Data: Shoulder pain status post fall.  LEFT SHOULDER - 2+ VIEW  Comparison: None.  Findings: There is a comminuted and mildly displaced acute fracture involving the left humeral neck.  No involvement of the humeral head articular surface is identified.  There is no dislocation. There are underlying advanced glenohumeral degenerative changes. The subacromial space is preserved.  IMPRESSION: Comminuted fracture of the left humeral neck.  Severe underlying glenohumeral degenerative changes.   Original Report Authenticated By: Carey Bullocks, M.D.   Dg Humerus Left  09/10/2012   *RADIOLOGY REPORT*  Clinical Data: Fall yesterday.  Left humeral neck fracture identified on earlier shoulder imaging.  LEFT HUMERUS - 2+ VIEW  Comparison: Left shoulder x-rays obtained earlier same date.  Findings: Comminuted impacted fracture involving the humeral neck as noted on the shoulder x-rays.  No fractures elsewhere involving the humerus.  Elbow joint intact with well-preserved joint spaces. Generalized osseous demineralization.  IMPRESSION: Left humeral neck fracture as noted on the earlier shoulder imaging.  No fractures elsewhere involving the left humerus.   Original Report Authenticated By: Hulan Saas, M.D.   Dg Hand Complete Left  09/10/2012   *RADIOLOGY REPORT*  Clinical Data: Hand injury status post fall last night.  LEFT HAND - COMPLETE 3+ VIEW  Comparison: None.  Findings: The bones appear mildly demineralized.  There is no evidence of acute fracture or dislocation.   There are moderately advanced degenerative changes at the first carpal metacarpal articulation. Mild degenerative changes are noted throughout the metacarpal phalangeal joints.  No focal soft tissue swelling is evident.  IMPRESSION: No acute osseous findings.  Degenerative changes as described.   Original Report Authenticated By: Carey Bullocks, M.D.    Cardiac Studies:  ECG:  SR inferolateral T wave changes J point elevation in I and AVL   Telemetry:  NSR no VT  Echo: pending  Medications:   . allopurinol  100 mg Oral Daily  . aspirin  325 mg Oral Daily  . atorvastatin  80 mg Oral q1800  . famotidine  20 mg Oral BID  . heparin  5,000 Units Subcutaneous Q8H  . metoprolol tartrate  12.5 mg Oral BID  . piperacillin-tazobactam (ZOSYN)  IV  3.375 g Intravenous Q8H  . vancomycin  1,000 mg Intravenous Q24H     . 0.9 %  NaCl with KCl 20 mEq / L 10,000 mL (09/10/12 2026)    Assessment/Plan:  CAD:  Her humeral fracture is displaced and impacted Will likely need surgery.  History of CAD and troponin quite high with some ECG changes will arrange cath today to clear for potential surgery Will hydrate for soft BP and Cr of 1.4  Continue asa and beta blocker  Chol:  Continue statin Ortho:  Have given nurse my cell for ortho to call.  Would not put under general anesthesia until cors cleared  Echo pending  Charlton Haws 09/11/2012, 7:44 AM

## 2012-09-11 NOTE — Progress Notes (Signed)
Pt with no urine output since I and O cath in ED at 1400 hrs, 8 hours ago.  IVF at 75 cc/hr. VSS.  No urinary urgency.  Bladder scan done with ZERO cc urine.  Findings called to Maren Reamer, PA.  Will continue to monitor

## 2012-09-11 NOTE — Progress Notes (Addendum)
[facility] Progress Note [name] TEAM 1 - Stepdown/ICU TEAM   Beth Gutierrez BJY:[id number] DOB: Feb 11, 1922 DOA: 09/10/2012 PCP: Beth Barre, MD  Brief narrative: 79 year old female resident of independent living multiple medical problems including known CAD and hypertension. Was sent to the emergency department after falling at home. Based on history patient apparently had a mechanical fall said she denied a sensation of feeling dizzy or any true syncope. She has significant pain in the left shoulder. X-ray revealed a nondisplaced comminuted impacted humeral neck fracture. Because of her history of coronary disease cardiac isoenzymes were checked and her troponin was elevated at 5.96. EKG was nonischemic.  Assessment/Plan: Active Problems:   CORONARY ARTERY DISEASE/NSTEMI (non-ST elevated myocardial infarction) -Cards following -cath planned 7/30 -cont low dose BB as long as BP supports -cont ASA and statin -ECHO pending   Falls/question of syncope -could've been precipitated by acute cardiac ischemia or orthostasis due to [facility] -PT/OT eval -family and patient friend reported pt with recent falls, unexplained nausea and progressive fatigue over past 3 months so ?? This is cardiac ischemic equivalent    Hypotension -seems due to [facility] -still no UOP (bladder scan confirmed) after > 12 hrs of IVF at 75 so will increase to 150   Leukocytosis -Likely related to acute fracture -UA not c/w UTI -consider dc anbx's    HYPERTENSION -BP soft and in the 90-100 range    CKD (chronic kidney disease) stage 2, GFR 60-89 ml/min -current GFR in the CKD 4 range so suspect ARF due to [facility] and recent Hypotension -follow lytes    Closed, comminuted fracture upper end of humerus -Ortho following -can treat non operatively with immobilization -sx management with IV pain meds until post cath then consider transition to oral med    Anemia, unspecified -hgb baseline around 9-10   DVT  prophylaxis: Subcutaneous heparin Code Status: Full Family Communication: Spoke with very close friend of the patient [name]) who is speaking on behalf of the patient's granddaughter (actual power of attorney) who resides in [place]. She will obtain POA paperwork and bring copy to hospital. [name] [phone]) Disposition Plan/Expected LOS: Remain in step down Isolation: None Nutritional Status: Appears to have acute on chronic protein calorie malnutrition. Seems to have a degree of dementia and may not be eating adequately prior to admission.  Consultants: Cardiology Orthopedics  Procedures: Cardiac catheterization pending 2-D echocardiogram pending  Antibiotics: Zosyn 7/29 >>> Vancomycin 7/29 >>>  HPI/Subjective: Patient awake but seems quite confused. Complaining of arm discomfort and thirst.   Objective: Blood pressure 104/56, pulse 85, temperature 97.5 F (36.4 C), temperature source Oral, resp. rate 19, height 5\' 2"  (1.575 m), weight 94.5 kg (208 lb 5.4 oz), SpO2 94.00%.  Intake/Output Summary (Last 24 hours) at 09/11/12 1219 Last data filed at 09/11/12 1148  Gross per 24 hour  Intake    903 ml  Output    300 ml  Net    603 ml     Exam: General: No acute respiratory distress Lungs: Clear to auscultation bilaterally without wheezes or crackles, RA Cardiovascular: Regular rate and rhythm without murmur gallop or rub normal S1 and S2, no peripheral edema or JVD-IVF@ 75/hr Abdomen: Nontender, nondistended, soft, bowel sounds positive, no rebound, no ascites, no appreciable mass Musculoskeletal: No significant cyanosis, clubbing of bilateral lower extremities Neurological: Alert and oriented x name, moves all extremities x 4 without focal neurological deficits, CN 2-12 intact  Scheduled Meds: Scheduled Meds: . allopurinol  100 mg Oral  Daily  . aspirin  325 mg Oral Daily  . atorvastatin  80 mg Oral q1800  . famotidine  20 mg Oral Daily  . heparin   5,000 Units Subcutaneous Q8H  . metoprolol tartrate  12.5 mg Oral BID  . piperacillin-tazobactam (ZOSYN)  IV  3.375 g Intravenous Q8H  . sodium chloride  3 mL Intravenous Q12H  . vancomycin  1,000 mg Intravenous Q24H   Continuous Infusions: . sodium chloride 75 mL/hr at 09/11/12 1129    **Reviewed in detail by the Attending Physician   Data Reviewed: Basic Metabolic Panel:  Recent Labs Lab 09/10/12 1346 09/10/12 2056  NA 145 141  K 3.9 5.2*  CL 107 108  CO2 22 20  GLUCOSE 118* 118*  BUN 20 22  CREATININE 1.40* 1.40*  CALCIUM 9.8 8.8  MG  --  1.9   Liver Function Tests:  Recent Labs Lab 09/10/12 1346 09/10/12 2056  AST 21 31  ALT 6 7  ALKPHOS 92 79  BILITOT 0.2* 0.3  PROT 7.1 6.4  ALBUMIN 3.9 3.3*   No results found for this basename: LIPASE, AMYLASE,  in the last 168 hours No results found for this basename: AMMONIA,  in the last 168 hours CBC:  Recent Labs Lab 09/10/12 1346 09/10/12 2056  WBC 19.7* 15.0*  NEUTROABS 17.9*  --   HGB 10.3* 9.7*  HCT 32.0* 29.6*  MCV 100.0 97.0  PLT 297 275   Cardiac Enzymes:  Recent Labs Lab 09/10/12 1346 09/10/12 2055 09/11/12 0255 09/11/12 0820  CKTOTAL 240*  --   --   --   TROPONINI 5.96* 6.10* 7.09* 5.16*   BNP (last 3 results) No results found for this basename: PROBNP,  in the last 8760 hours CBG:  Recent Labs Lab 09/10/12 2039 09/10/12 2348 09/11/12 0349  GLUCAP 109* 100* 103*    Recent Results (from the past 240 hour(s))  CULTURE, BLOOD (ROUTINE X 2)     Status: None   Collection Time    09/10/12  4:00 PM      Result Value Range Status   Specimen Description BLOOD R RADIAL ARTERY   Final   Special Requests BOTTLES DRAWN AEROBIC AND ANAEROBIC 10 CC   Final   Culture  Setup Time 09/10/2012 23:52   Final   Culture     Final   Value:        BLOOD CULTURE RECEIVED NO GROWTH TO DATE CULTURE WILL BE HELD FOR 5 DAYS BEFORE ISSUING A FINAL NEGATIVE REPORT   Report Status PENDING   Incomplete   MRSA PCR SCREENING     Status: None   Collection Time    09/10/12  6:54 PM      Result Value Range Status   MRSA by PCR NEGATIVE  NEGATIVE Final   Comment:            The GeneXpert MRSA Assay (FDA     approved for NASAL specimens     only), is one component of a     comprehensive MRSA colonization     surveillance program. It is not     intended to diagnose MRSA     infection nor to guide or     monitor treatment for     MRSA infections.     Studies:  Recent x-ray studies have been reviewed in detail by the Attending Physician    Junious Silk, ANP [facility] Office  [phone] Pager [phone]  **If unable to reach the  above provider after paging please contact the Flow Manager @ [phone]  On-Call/Text Page:      [email or web address]      password [email or web address]  If 7PM-7AM, please contact night-coverage [email or web address] Password [facility] 09/11/2012, 12:19 PM   LOS: 1 day   I have examined the patient, reviewed the chart and modified the above note which I agree with.   RIZWAN,SAIMA,MD [phone] 09/11/2012, 3:36 PM

## 2012-09-11 NOTE — Progress Notes (Signed)
Patient ID: Beth Gutierrez, female   DOB: 04/18/1921, 79 y.o.   MRN: [id number]    Subjective:  Denies SSCP, palpitations or Dyspnea She has had a lot of belching and GI symptoms the last 3 months  Ortho has not seen yet  Objective:  Filed Vitals:   09/11/12 0000 09/11/12 0400 09/11/12 0448 09/11/12 0600  BP: 102/56 92/60    Pulse: 90 78 82   Temp: 98.4 F (36.9 C) 98 F (36.7 C)    TempSrc: Oral Oral    Resp: 21  21   Height:      Weight:   208 lb 5.4 oz (94.5 kg)   SpO2: 97% 98% 97% 100%    Intake/Output from previous day:  Intake/Output Summary (Last 24 hours) at 09/11/12 0744 Last data filed at 09/11/12 0700  Gross per 24 hour  Intake    900 ml  Output    300 ml  Net    600 ml    Physical Exam: Affect appropriate Obese female HEENT: normal Neck supple with no adenopathy JVP normal no bruits no thyromegaly Lungs basilar crackles with no wheezing  Heart:  S1/S2 no murmur, no rub, gallop or click PMI normal Abdomen: benighn, BS positve, no tenderness, no AAA no bruit.  No HSM or HJR Distal pulses intact with no bruits No edema Neuro non-focal Skin warm and dry Left humeral neck fracture    Lab Results: Basic Metabolic Panel:  Recent Labs  09/10/12 1346 09/10/12 2056  NA 145 141  K 3.9 5.2*  CL 107 108  CO2 22 20  GLUCOSE 118* 118*  BUN 20 22  CREATININE 1.40* 1.40*  CALCIUM 9.8 8.8  MG  --  1.9   Liver Function Tests:  Recent Labs  09/10/12 1346 09/10/12 2056  AST 21 31  ALT 6 7  ALKPHOS 92 79  BILITOT 0.2* 0.3  PROT 7.1 6.4  ALBUMIN 3.9 3.3*   CBC:  Recent Labs  09/10/12 1346 09/10/12 2056  WBC 19.7* 15.0*  NEUTROABS 17.9*  --   HGB 10.3* 9.7*  HCT 32.0* 29.6*  MCV 100.0 97.0  PLT 297 275   Cardiac Enzymes:  Recent Labs  09/10/12 1346 09/10/12 2055 09/11/12 0255  CKTOTAL 240*  --   --   TROPONINI 5.96* 6.10* 7.09*   Hemoglobin A1C:  Recent Labs  09/10/12 2056  HGBA1C 5.2   Thyroid Function  Tests:  Recent Labs  09/10/12 2056  TSH 0.889    Imaging: Dg Chest 1 View  09/10/2012   *RADIOLOGY REPORT*  Clinical Data: Unwitnessed fall last night.  Left humeral fracture.  CHEST - 1 VIEW  Comparison: 06/03/2012.  Findings: There are lower lung volumes and more lordotic positioning currently.  Allowing for this, the heart size and mediastinal contours are stable.  There is increased linear atelectasis at the right lung base.  The left lung appears clear. Mildly comminuted fracture of the left humeral neck is noted.  No other acute osseous findings are identified.  Glenohumeral degenerative changes are present bilaterally.  IMPRESSION:  Low lung volumes with resulting right basilar atelectasis.  Left humeral neck fracture.   Original Report Authenticated By: William Veazey, M.D.   Dg Hip Complete Right  09/10/2012   *RADIOLOGY REPORT*  Clinical Data: Fell yesterday.  Right hip pain.  RIGHT HIP - COMPLETE 2+ VIEW  Comparison: None.  Findings: No evidence of acute fracture or dislocation.  Severe joint space narrowing with associated hypertrophic spurring involving   the acetabulum and the femoral head.  Moderate osseous demineralization.  Included AP pelvis demonstrates a prior left total hip arthroplasty with anatomic alignment and no complicating features.  Symphysis pubis intact.  Sacroiliac joints intact with degenerative changes. Degenerative changes involving the visualized lower lumbar spine.  IMPRESSION: No acute osseous abnormality.  Severe osteoarthritis involving the right hip.   Original Report Authenticated By: Thomas Lawrence, M.D.   Ct Head Wo Contrast  09/10/2012   *RADIOLOGY REPORT*  Clinical Data: Fall  CT HEAD WITHOUT CONTRAST  Technique:  Contiguous axial images were obtained from the base of the skull through the vertex without contrast.  Comparison: none  Findings: Generalized atrophy.  Chronic microvascular ischemic change in the white matter.  Negative for acute infarct.   Negative for hemorrhage mass or skull fracture.  Sinusitis with air-fluid levels in the left ethmoid and sphenoid sinuses as well as chronic mucosal thickening and bony thickening of the left ethmoid and sphenoid sinus.  IMPRESSION: Atrophy and chronic microvascular ischemic change.  No acute intracranial abnormality.  Sinusitis.   Original Report Authenticated By: David Clark, M.D.   Dg Shoulder Left  09/10/2012   *RADIOLOGY REPORT*  Clinical Data: Shoulder pain status post fall.  LEFT SHOULDER - 2+ VIEW  Comparison: None.  Findings: There is a comminuted and mildly displaced acute fracture involving the left humeral neck.  No involvement of the humeral head articular surface is identified.  There is no dislocation. There are underlying advanced glenohumeral degenerative changes. The subacromial space is preserved.  IMPRESSION: Comminuted fracture of the left humeral neck.  Severe underlying glenohumeral degenerative changes.   Original Report Authenticated By: William Veazey, M.D.   Dg Humerus Left  09/10/2012   *RADIOLOGY REPORT*  Clinical Data: Fall yesterday.  Left humeral neck fracture identified on earlier shoulder imaging.  LEFT HUMERUS - 2+ VIEW  Comparison: Left shoulder x-rays obtained earlier same date.  Findings: Comminuted impacted fracture involving the humeral neck as noted on the shoulder x-rays.  No fractures elsewhere involving the humerus.  Elbow joint intact with well-preserved joint spaces. Generalized osseous demineralization.  IMPRESSION: Left humeral neck fracture as noted on the earlier shoulder imaging.  No fractures elsewhere involving the left humerus.   Original Report Authenticated By: Thomas Lawrence, M.D.   Dg Hand Complete Left  09/10/2012   *RADIOLOGY REPORT*  Clinical Data: Hand injury status post fall last night.  LEFT HAND - COMPLETE 3+ VIEW  Comparison: None.  Findings: The bones appear mildly demineralized.  There is no evidence of acute fracture or dislocation.   There are moderately advanced degenerative changes at the first carpal metacarpal articulation. Mild degenerative changes are noted throughout the metacarpal phalangeal joints.  No focal soft tissue swelling is evident.  IMPRESSION: No acute osseous findings.  Degenerative changes as described.   Original Report Authenticated By: William Veazey, M.D.    Cardiac Studies:  ECG:  SR inferolateral T wave changes J point elevation in I and AVL   Telemetry:  NSR no VT  Echo: pending  Medications:   . allopurinol  100 mg Oral Daily  . aspirin  325 mg Oral Daily  . atorvastatin  80 mg Oral q1800  . famotidine  20 mg Oral BID  . heparin  5,000 Units Subcutaneous Q8H  . metoprolol tartrate  12.5 mg Oral BID  . piperacillin-tazobactam (ZOSYN)  IV  3.375 g Intravenous Q8H  . vancomycin  1,000 mg Intravenous Q24H     . 0.9 %   NaCl with KCl 20 mEq / L 10,000 mL (09/10/12 2026)    Assessment/Plan:  CAD:  Her humeral fracture is displaced and impacted Will likely need surgery.  History of CAD and troponin quite high with some ECG changes will arrange cath today to clear for potential surgery Will hydrate for soft BP and Cr of 1.4  Continue asa and beta blocker  Chol:  Continue statin Ortho:  Have given nurse my cell for ortho to call.  Would not put under general anesthesia until cors cleared  Echo pending  Peter Nishan 09/11/2012, 7:44 AM     

## 2012-09-11 NOTE — Progress Notes (Signed)
Followup :  No urinary output for 13 hrs with IVF at 75 cc/hr , no urinary urgency , BP 93/51 HR 88.  Bladder scan for ZERO cc urine.  Finding reported to Maren Reamer, PA.  Will continue to monitor

## 2012-09-11 NOTE — CV Procedure (Signed)
   Cardiac Catheterization Procedure Note  Name: Beth Gutierrez MRN: [id number] DOB: June 05, 1921  Procedure: Left Heart Cath, Selective Coronary Angiography  Indication: Elevated troponin, history of CAD.    Procedural details: We were unable to use radial access due to shoulder injury/pain and IV positioning.  The right groin was prepped, draped, and anesthetized with 1% lidocaine. Using modified Seldinger technique, a 5 French sheath was introduced into the right femoral artery. Standard Judkins catheters were used for coronary angiography. Catheter exchanges were performed over a guidewire. There were no immediate procedural complications. The patient was transferred to the post catheterization recovery area for further monitoring.  Procedural Findings: Hemodynamics:  AO 105/65 LV 111/29   Coronary angiography: Coronary dominance: right  Left mainstem: 40% distal left main stenosis.   Left anterior descending (LAD): 30% ostial LAD stenosis, 30% mid LAD stenosis. Large D1 with 30% ostial stenosis.  Moderate D2.   Left circumflex (LCx): 30% mid LCx stenosis.   Right coronary artery (RCA): Long area of up to 70% in-stent restenosis in the mid RCA.   Left ventriculography: Not done due to CKD.  Final Conclusions:  LAD and LCx with nonobstructive disease.  RCA with 70% in-stent restenosis, not a critical lesion/good flow down RCA.  I did not do an LV-gram due to CKD but the apex did not appear to move well.  I query a Takotsubo cardiomyopathy in the setting of fall/humeral fracture.  She will have echo done.  I would plan medical management at this time.  LVEDP is elevated in the setting of IV fluid.  I am going to discontinue IV fluid and will give her 2 doses of IV Lasix, the first tonight.   Marca Ancona 09/11/2012, 1:16 PM

## 2012-09-11 NOTE — Care Management Note (Signed)
    Page 1 of 2   09/16/2012     4:53:26 PM   CARE MANAGEMENT NOTE 09/16/2012  Patient:  Beth Gutierrez, Beth Gutierrez   Account Number:  [id number]  Date Initiated:  09/11/2012  Documentation initiated by:  Donn Pierini  Subjective/Objective Assessment:   Pt admitted s/p fall with elevated troponin and ?sirs     Action/Plan:   PTA pt lived at ALF- [facility]- may need highter level of care and will need PT/OT when medically stable   Anticipated DC Date:  09/16/2012   Anticipated DC Plan:  SKILLED NURSING FACILITY  In-house referral  Clinical Social Worker      DC Planning Services  CM consult      Choice offered to / List presented to:             Status of service:  Completed, signed off Medicare Important Message given?   (If response is "NO", the following Medicare IM given date fields will be blank) Date Medicare IM given:   Date Additional Medicare IM given:    Discharge Disposition:  SKILLED NURSING FACILITY  Per UR Regulation:  Reviewed for med. necessity/level of care/duration of stay  If discussed at Long Length of Stay Meetings, dates discussed:    Comments:  Contact:  Reather Laurence [phone]                 [facility] [phone]                  [place], [place] [phone]  09/16/12 Raynelle Fanning Qamar Aughenbaugh,RN,BSN [phone] PT DISCHARGING TO SNF TODAY, PER CSW ARRANGEMENTS.   09/13/12 Manasseh Pittsley,RN,BSN [phone] MET WITH PT AND FRIEND BLANCHE AT BEDSIDE.  NIECE, POA ON SPEAKER PHONE.  EXPLAINED THAT P.T. RECOMMENDING SNF AT DC FOR REHAB.  PT/FAMILY AGREEABLE TO THIS PLAN.  WILL CONSULT CSW TO FACILITATE DC TO SNF WHEN STABLE.  PT IS FROM [facility], AND HAS NO ASSISTANCE AT HOME.  FAMILY/FRIEND FEEL PT MAY NOT BE ABLE TO RETURN THERE.  CSW TO FOLLOW.

## 2012-09-11 NOTE — Progress Notes (Signed)
Echocardiogram 2D Echocardiogram has been performed.  Lorrie Strauch 09/11/2012, 10:12 AM

## 2012-09-11 NOTE — Consult Note (Signed)
Reason for Consult: Left shoulder pain Referring Physician: Zionna, [place] is an 79 y.o. female.  HPI: Beth Gutierrez is a 79 y.o. female with Past medical history of hypertension, CAD, GERD, Dyslipidemia, who is from an assisted living facility, presented with complain of a fall that happen last night. She reports she fell in her room last night onto the left outstretched arm.She had immediate shoulder pain, but did not have any immediate assistance and laid on an outstretched left arm until help arrived. Denies history of injury to the shoulder. It is worse with any movement, better with rest.It is sore, throbbing and sharp with movement.Denies pain in her elbow hand or wrist.Denies numbness or tingling distally.Over the last 24 hours, it has been treated in a sling.    Past Medical History  Diagnosis Date  . Acute myocardial infarction, unspecified site, episode of care unspecified   . Congestive heart failure, unspecified   . Coronary atherosclerosis of unspecified type of vessel, native or graft   . Unspecified essential hypertension   . Other and unspecified hyperlipidemia   . Esophageal reflux   . Depressive disorder, not elsewhere classified   . Morbid obesity   . Need for prophylactic vaccination and inoculation against influenza   . Allergic rhinitis, cause unspecified   . Other malaise and fatigue   . Disorder of bone and cartilage, unspecified   . Diverticulosis of colon (without mention of hemorrhage)   . Benign neoplasm of colon   . Gout, unspecified   . Unspecified glaucoma(365.9)   . Osteoarthrosis, unspecified whether generalized or localized, unspecified site   . Gout flare 02/06/2011  . Anemia, unspecified 04/05/2011    Past Surgical History  Procedure Laterality Date  . Coronary angioplasty with stent placement      RCA stent  . Vesicovaginal fistula closure w/ tah    . Left hip replacement      s/p 2002. Secondary to DJD  . Ectopic  pregnancy surgery      Family History  Problem Relation Age of Onset  . Breast cancer Sister   . Hypertension Father   . Stroke Father     Social History:  reports that she quit smoking about 22 years ago. She has never used smokeless tobacco. She reports that she does not drink alcohol or use illicit drugs.  Allergies:  Allergies  Allergen Reactions  . Sulfonamide Derivatives Other (See Comments)    unknown    Medications: I have reviewed the patient's current medications.  Results for orders placed during the hospital encounter of 09/10/12 (from the past 48 hour(s))  CBC WITH DIFFERENTIAL     Status: Abnormal   Collection Time    09/10/12  1:46 PM      Result Value Range   WBC 19.7 (*) 4.0 - 10.5 K/uL   RBC 3.20 (*) 3.87 - 5.11 MIL/uL   Hemoglobin 10.3 (*) 12.0 - 15.0 g/dL   HCT 14.7 (*) 82.9 - 56.2 %   MCV 100.0  78.0 - 100.0 fL   MCH 32.2  26.0 - 34.0 pg   MCHC 32.2  30.0 - 36.0 g/dL   RDW 13.0 (*) 86.5 - 78.4 %   Platelets 297  150 - 400 K/uL   Neutrophils Relative % 87 (*) 43 - 77 %   Lymphocytes Relative 5 (*) 12 - 46 %   Monocytes Relative 4  3 - 12 %   Eosinophils Relative 0  0 - 5 %  Basophils Relative 0  0 - 1 %   Band Neutrophils 4  0 - 10 %   Neutro Abs 17.9 (*) 1.7 - 7.7 K/uL   Lymphs Abs 1.0  0.7 - 4.0 K/uL   Monocytes Absolute 0.8  0.1 - 1.0 K/uL   Eosinophils Absolute 0.0  0.0 - 0.7 K/uL   Basophils Absolute 0.0  0.0 - 0.1 K/uL   RBC Morphology STOMATOCYTES     Comment: SPHEROCYTES  COMPREHENSIVE METABOLIC PANEL     Status: Abnormal   Collection Time    09/10/12  1:46 PM      Result Value Range   Sodium 145  135 - 145 mEq/L   Potassium 3.9  3.5 - 5.1 mEq/L   Chloride 107  96 - 112 mEq/L   CO2 22  19 - 32 mEq/L   Glucose, Bld 118 (*) 70 - 99 mg/dL   BUN 20  6 - 23 mg/dL   Creatinine, Ser 4.09 (*) 0.50 - 1.10 mg/dL   Calcium 9.8  8.4 - 81.1 mg/dL   Total Protein 7.1  6.0 - 8.3 g/dL   Albumin 3.9  3.5 - 5.2 g/dL   AST 21  0 - 37 U/L   ALT  6  0 - 35 U/L   Alkaline Phosphatase 92  39 - 117 U/L   Total Bilirubin 0.2 (*) 0.3 - 1.2 mg/dL   GFR calc non Af Amer 32 (*) >90 mL/min   GFR calc Af Amer 37 (*) >90 mL/min   Comment:            The eGFR has been calculated     using the CKD EPI equation.     This calculation has not been     validated in all clinical     situations.     eGFR's persistently     <90 mL/min signify     possible Chronic Kidney Disease.  TROPONIN I     Status: Abnormal   Collection Time    09/10/12  1:46 PM      Result Value Range   Troponin I 5.96 (*) <0.30 ng/mL   Comment:            Due to the release kinetics of cTnI,     a negative result within the first hours     of the onset of symptoms does not rule out     myocardial infarction with certainty.     If myocardial infarction is still suspected,     repeat the test at appropriate intervals.     CRITICAL RESULT CALLED TO, READ BACK BY AND VERIFIED WITH:     CALLED TO K.PRESNELL AT 1430 ON 09/10/12 BY S.ROY  CK     Status: Abnormal   Collection Time    09/10/12  1:46 PM      Result Value Range   Total CK 240 (*) 7 - 177 U/L  URINALYSIS, ROUTINE W REFLEX MICROSCOPIC     Status: Abnormal   Collection Time    09/10/12  2:10 PM      Result Value Range   Color, Urine AMBER (*) YELLOW   Comment: BIOCHEMICALS MAY BE AFFECTED BY COLOR   APPearance CLOUDY (*) CLEAR   Specific Gravity, Urine 1.023  1.005 - 1.030   pH 5.0  5.0 - 8.0   Glucose, UA NEGATIVE  NEGATIVE mg/dL   Hgb urine dipstick NEGATIVE  NEGATIVE   Bilirubin Urine SMALL (*) NEGATIVE  Ketones, ur 15 (*) NEGATIVE mg/dL   Protein, ur 30 (*) NEGATIVE mg/dL   Urobilinogen, UA 1.0  0.0 - 1.0 mg/dL   Nitrite NEGATIVE  NEGATIVE   Leukocytes, UA NEGATIVE  NEGATIVE  URINE MICROSCOPIC-ADD ON     Status: Abnormal   Collection Time    09/10/12  2:10 PM      Result Value Range   Squamous Epithelial / LPF RARE  RARE   WBC, UA 0-2  <3 WBC/hpf   RBC / HPF 0-2  <3 RBC/hpf   Bacteria, UA  FEW (*) RARE   Casts HYALINE CASTS (*) NEGATIVE   Comment: GRANULAR CAST   Urine-Other MUCOUS PRESENT     Comment: AMORPHOUS URATES/PHOSPHATES  CG4 I-STAT (LACTIC ACID)     Status: None   Collection Time    09/10/12  3:30 PM      Result Value Range   Lactic Acid, Venous 1.24  0.5 - 2.2 mmol/L  CULTURE, BLOOD (ROUTINE X 2)     Status: None   Collection Time    09/10/12  4:00 PM      Result Value Range   Specimen Description BLOOD R RADIAL ARTERY     Special Requests BOTTLES DRAWN AEROBIC AND ANAEROBIC 10 CC     Culture  Setup Time 09/10/2012 23:52     Culture       Value:        BLOOD CULTURE RECEIVED NO GROWTH TO DATE CULTURE WILL BE HELD FOR 5 DAYS BEFORE ISSUING A FINAL NEGATIVE REPORT   Report Status PENDING    MRSA PCR SCREENING     Status: None   Collection Time    09/10/12  6:54 PM      Result Value Range   MRSA by PCR NEGATIVE  NEGATIVE   Comment:            The GeneXpert MRSA Assay (FDA     approved for NASAL specimens     only), is one component of a     comprehensive MRSA colonization     surveillance program. It is not     intended to diagnose MRSA     infection nor to guide or     monitor treatment for     MRSA infections.  GLUCOSE, CAPILLARY     Status: Abnormal   Collection Time    09/10/12  8:39 PM      Result Value Range   Glucose-Capillary 109 (*) 70 - 99 mg/dL   Comment 1 Notify RN     Comment 2 Documented in Chart    TYPE AND SCREEN     Status: None   Collection Time    09/10/12  8:45 PM      Result Value Range   ABO/RH(D) A POS     Antibody Screen NEG     Sample Expiration 09/13/2012    ABO/RH     Status: None   Collection Time    09/10/12  8:45 PM      Result Value Range   ABO/RH(D) A POS    TROPONIN I     Status: Abnormal   Collection Time    09/10/12  8:55 PM      Result Value Range   Troponin I 6.10 (*) <0.30 ng/mL   Comment:            Due to the release kinetics of cTnI,     a negative result within the first hours  of the  onset of symptoms does not rule out     myocardial infarction with certainty.     If myocardial infarction is still suspected,     repeat the test at appropriate intervals.     CRITICAL RESULT CALLED TO, READ BACK BY AND VERIFIED WITH:     Kristine Royal [id number] 09/10/12 WBOND  COMPREHENSIVE METABOLIC PANEL     Status: Abnormal   Collection Time    09/10/12  8:56 PM      Result Value Range   Sodium 141  135 - 145 mEq/L   Potassium 5.2 (*) 3.5 - 5.1 mEq/L   Chloride 108  96 - 112 mEq/L   CO2 20  19 - 32 mEq/L   Glucose, Bld 118 (*) 70 - 99 mg/dL   BUN 22  6 - 23 mg/dL   Creatinine, Ser 1.61 (*) 0.50 - 1.10 mg/dL   Calcium 8.8  8.4 - 09.6 mg/dL   Total Protein 6.4  6.0 - 8.3 g/dL   Albumin 3.3 (*) 3.5 - 5.2 g/dL   AST 31  0 - 37 U/L   ALT 7  0 - 35 U/L   Alkaline Phosphatase 79  39 - 117 U/L   Total Bilirubin 0.3  0.3 - 1.2 mg/dL   GFR calc non Af Amer 32 (*) >90 mL/min   GFR calc Af Amer 37 (*) >90 mL/min   Comment:            The eGFR has been calculated     using the CKD EPI equation.     This calculation has not been     validated in all clinical     situations.     eGFR's persistently     <90 mL/min signify     possible Chronic Kidney Disease.  TSH     Status: None   Collection Time    09/10/12  8:56 PM      Result Value Range   TSH 0.889  0.350 - 4.500 uIU/mL  PROTIME-INR     Status: None   Collection Time    09/10/12  8:56 PM      Result Value Range   Prothrombin Time 13.8  11.6 - 15.2 seconds   INR 1.08  0.00 - 1.49  HEMOGLOBIN A1C     Status: None   Collection Time    09/10/12  8:56 PM      Result Value Range   Hemoglobin A1C 5.2  <5.7 %   Comment: (NOTE)                                                                               According to the ADA Clinical Practice Recommendations for 2011, when     HbA1c is used as a screening test:      >=6.5%   Diagnostic of Diabetes Mellitus               (if abnormal result is confirmed)     5.7-6.4%   Increased risk  of developing Diabetes Mellitus     References:Diagnosis and Classification of Diabetes Mellitus,Diabetes     Care,2011,34(Suppl 1):S62-S69 and Standards of Medical Care in  Diabetes - 2011,Diabetes Care,2011,34 (Suppl 1):S11-S61.   Mean Plasma Glucose 103  <117 mg/dL  MAGNESIUM     Status: None   Collection Time    09/10/12  8:56 PM      Result Value Range   Magnesium 1.9  1.5 - 2.5 mg/dL  APTT     Status: None   Collection Time    09/10/12  8:56 PM      Result Value Range   aPTT 24  24 - 37 seconds  CBC     Status: Abnormal   Collection Time    09/10/12  8:56 PM      Result Value Range   WBC 15.0 (*) 4.0 - 10.5 K/uL   RBC 3.05 (*) 3.87 - 5.11 MIL/uL   Hemoglobin 9.7 (*) 12.0 - 15.0 g/dL   HCT 16.1 (*) 09.6 - 04.5 %   MCV 97.0  78.0 - 100.0 fL   MCH 31.8  26.0 - 34.0 pg   MCHC 32.8  30.0 - 36.0 g/dL   RDW 40.9 (*) 81.1 - 91.4 %   Platelets 275  150 - 400 K/uL  GLUCOSE, CAPILLARY     Status: Abnormal   Collection Time    09/10/12 11:48 PM      Result Value Range   Glucose-Capillary 100 (*) 70 - 99 mg/dL   Comment 1 Notify RN     Comment 2 Documented in Chart    TROPONIN I     Status: Abnormal   Collection Time    09/11/12  2:55 AM      Result Value Range   Troponin I 7.09 (*) <0.30 ng/mL   Comment:            Due to the release kinetics of cTnI,     a negative result within the first hours     of the onset of symptoms does not rule out     myocardial infarction with certainty.     If myocardial infarction is still suspected,     repeat the test at appropriate intervals.     CRITICAL VALUE NOTED.  VALUE IS CONSISTENT WITH PREVIOUSLY REPORTED AND CALLED VALUE.  GLUCOSE, CAPILLARY     Status: Abnormal   Collection Time    09/11/12  3:49 AM      Result Value Range   Glucose-Capillary 103 (*) 70 - 99 mg/dL   Comment 1 Notify RN     Comment 2 Documented in Chart      Dg Chest 1 View  09/10/2012   *RADIOLOGY REPORT*  Clinical Data: Unwitnessed fall last  night.  Left humeral fracture.  CHEST - 1 VIEW  Comparison: 06/03/2012.  Findings: There are lower lung volumes and more lordotic positioning currently.  Allowing for this, the heart size and mediastinal contours are stable.  There is increased linear atelectasis at the right lung base.  The left lung appears clear. Mildly comminuted fracture of the left humeral neck is noted.  No other acute osseous findings are identified.  Glenohumeral degenerative changes are present bilaterally.  IMPRESSION:  Low lung volumes with resulting right basilar atelectasis.  Left humeral neck fracture.   Original Report Authenticated By: Carey Bullocks, M.D.   Dg Hip Complete Right  09/10/2012   *RADIOLOGY REPORT*  Clinical Data: Larey Seat yesterday.  Right hip pain.  RIGHT HIP - COMPLETE 2+ VIEW  Comparison: None.  Findings: No evidence of acute fracture or dislocation.  Severe joint space narrowing with associated hypertrophic  spurring involving the acetabulum and the femoral head.  Moderate osseous demineralization.  Included AP pelvis demonstrates a prior left total hip arthroplasty with anatomic alignment and no complicating features.  Symphysis pubis intact.  Sacroiliac joints intact with degenerative changes. Degenerative changes involving the visualized lower lumbar spine.  IMPRESSION: No acute osseous abnormality.  Severe osteoarthritis involving the right hip.   Original Report Authenticated By: Hulan Saas, M.D.   Ct Head Wo Contrast  09/10/2012   *RADIOLOGY REPORT*  Clinical Data: Fall  CT HEAD WITHOUT CONTRAST  Technique:  Contiguous axial images were obtained from the base of the skull through the vertex without contrast.  Comparison: none  Findings: Generalized atrophy.  Chronic microvascular ischemic change in the white matter.  Negative for acute infarct.  Negative for hemorrhage mass or skull fracture.  Sinusitis with air-fluid levels in the left ethmoid and sphenoid sinuses as well as chronic mucosal thickening  and bony thickening of the left ethmoid and sphenoid sinus.  IMPRESSION: Atrophy and chronic microvascular ischemic change.  No acute intracranial abnormality.  Sinusitis.   Original Report Authenticated By: Janeece Riggers, M.D.   Dg Shoulder Left  09/10/2012   *RADIOLOGY REPORT*  Clinical Data: Shoulder pain status post fall.  LEFT SHOULDER - 2+ VIEW  Comparison: None.  Findings: There is a comminuted and mildly displaced acute fracture involving the left humeral neck.  No involvement of the humeral head articular surface is identified.  There is no dislocation. There are underlying advanced glenohumeral degenerative changes. The subacromial space is preserved.  IMPRESSION: Comminuted fracture of the left humeral neck.  Severe underlying glenohumeral degenerative changes.   Original Report Authenticated By: Carey Bullocks, M.D.   Dg Humerus Left  09/10/2012   *RADIOLOGY REPORT*  Clinical Data: Fall yesterday.  Left humeral neck fracture identified on earlier shoulder imaging.  LEFT HUMERUS - 2+ VIEW  Comparison: Left shoulder x-rays obtained earlier same date.  Findings: Comminuted impacted fracture involving the humeral neck as noted on the shoulder x-rays.  No fractures elsewhere involving the humerus.  Elbow joint intact with well-preserved joint spaces. Generalized osseous demineralization.  IMPRESSION: Left humeral neck fracture as noted on the earlier shoulder imaging.  No fractures elsewhere involving the left humerus.   Original Report Authenticated By: Hulan Saas, M.D.   Dg Hand Complete Left  09/10/2012   *RADIOLOGY REPORT*  Clinical Data: Hand injury status post fall last night.  LEFT HAND - COMPLETE 3+ VIEW  Comparison: None.  Findings: The bones appear mildly demineralized.  There is no evidence of acute fracture or dislocation.  There are moderately advanced degenerative changes at the first carpal metacarpal articulation. Mild degenerative changes are noted throughout the metacarpal  phalangeal joints.  No focal soft tissue swelling is evident.  IMPRESSION: No acute osseous findings.  Degenerative changes as described.   Original Report Authenticated By: Carey Bullocks, M.D.    Review of Systems  Musculoskeletal: Positive for joint pain.       Left shoulder with pain.  No ecchymosis, erythema or warmth.  Some mild swelling distally of the left upper extremity.  All other systems reviewed and are negative.   Blood pressure 92/60, pulse 82, temperature 97.9 F (36.6 C), temperature source Oral, resp. rate 21, height 5\' 2"  (1.575 m), weight 94.5 kg (208 lb 5.4 oz), SpO2 100.00%. Physical Exam  Constitutional: She is oriented to person, place, and time. She appears well-developed and well-nourished.  HENT:  Head: Normocephalic and atraumatic.  Eyes: EOM are  normal.  Respiratory: Effort normal.  Musculoskeletal:  Left shoulder exam limited by pain. Mild swelling distally around the left upper extremity, no ecchymosis, erythema or warmth. Left shoulder tender to palpation diffusely. Left elbow with full range of motion, nontender to palpation.  Hand and wrist with full range of motion and nontender to palpation. Distally she is neurovascularly intact.  Neurological: She is alert and oriented to person, place, and time.  Skin: Skin is warm and dry.  Psychiatric: She has a normal mood and affect. Her behavior is normal. Judgment and thought content normal.    Assessment/Plan: Left shoulder nondisplaced comminuted impacted Humeral neck fracture Nondisplaced and we'll treat it nonoperatively, And in a sling Discussed with the patient that she can come out of the sling to perform gentle hand wrist and elbow range of motion activities.  No range of motion of the shoulder is allowed. If she is to Go to a skilled nursing facility or assisted living after this admission, Occupational therapy can perform gentle hand wrist and elbow range of motion exercises, no shoulder range of  motion exercises She will follow up with Dr. Ave Filter at [facility] orthopedics in 2 weeks  Jannell Franta 09/11/2012, 9:05 AM

## 2012-09-11 NOTE — Interval H&P Note (Signed)
History and Physical Interval Note:  09/11/2012 12:42 PM  Beth Gutierrez  has presented today for surgery, with the diagnosis of NSTEMI  The various methods of treatment have been discussed with the patient and family. After consideration of risks, benefits and other options for treatment, the patient has consented to  Procedure(s): LEFT HEART CATHETERIZATION WITH CORONARY ANGIOGRAM (N/A) as a surgical intervention .  The patient's history has been reviewed, patient examined, no change in status, stable for surgery.  I have reviewed the patient's chart and labs.  Questions were answered to the patient's satisfaction.     [name] [employer]

## 2012-09-12 DIAGNOSIS — I509 Heart failure, unspecified: Secondary | ICD-10-CM

## 2012-09-12 DIAGNOSIS — N390 Urinary tract infection, site not specified: Secondary | ICD-10-CM

## 2012-09-12 LAB — BASIC METABOLIC PANEL
BUN: 28 mg/dL — ABNORMAL HIGH (ref 6–23)
Calcium: 8.3 mg/dL — ABNORMAL LOW (ref 8.4–10.5)
GFR calc Af Amer: 27 mL/min — ABNORMAL LOW (ref >90)
GFR calc non Af Amer: 23 mL/min — ABNORMAL LOW (ref >90)
Potassium: 4.6 mEq/L (ref 3.5–5.1)

## 2012-09-12 LAB — CBC
Hemoglobin: 9.1 g/dL — ABNORMAL LOW (ref 12.0–15.0)
MCHC: 32.2 g/dL (ref 30.0–36.0)
Platelets: 280 10*3/uL (ref 150–400)
RDW: 16.3 % — ABNORMAL HIGH (ref 11.5–15.5)

## 2012-09-12 LAB — GLUCOSE, CAPILLARY
Glucose-Capillary: 102 mg/dL — ABNORMAL HIGH (ref 70–99)
Glucose-Capillary: 110 mg/dL — ABNORMAL HIGH (ref 70–99)
Glucose-Capillary: 149 mg/dL — ABNORMAL HIGH (ref 70–99)
Glucose-Capillary: 88 mg/dL (ref 70–99)

## 2012-09-12 MED ORDER — HYDROCODONE-ACETAMINOPHEN 5-325 MG PO TABS
1.0000 | ORAL_TABLET | ORAL | Status: DC | PRN
  Administered 2012-09-13 – 2012-09-15 (×10): 1 via ORAL
  Filled 2012-09-12 (×11): qty 1

## 2012-09-12 NOTE — Progress Notes (Signed)
[facility] Progress Note [place] TEAM 1 - Stepdown/ICU TEAM   SILVINA HACKLEMAN ZOX:[id number] DOB: [birth year] DOA: 09/10/2012 PCP: Oliver Barre, MD  Brief narrative: 79 year old female resident of independent living multiple medical problems including known CAD and hypertension. Was sent to the emergency department after falling at home. Based on history patient apparently had a mechanical fall said she denied a sensation of feeling dizzy or any true syncope. She has significant pain in the left shoulder. X-ray revealed a nondisplaced comminuted impacted humeral neck fracture. Because of her history of coronary disease cardiac isoenzymes were checked and her troponin was elevated at 5.96. EKG was nonischemic.  Assessment/Plan: Active Problems:   CORONARY ARTERY DISEASE/NSTEMI (non-ST elevated myocardial infarction) -Cards following -cath 7/30 70% in stent restenosis that did not meet criteria for PCI so focus on med management -cont low dose BB as long as BP supports -cont ASA and statin -ECHO with focal RWMA and low EF (see below) with ventricular dilatation --pt's friend reported pt with recent falls, unexplained nausea and progressive fatigue over past 3 months so ?? this is cardiac ischemic equivalent    Falls/question of syncope -could've been precipitated by acute cardiac ischemia or orthostasis due to [facility] -PT/OT eval   Acute systolic heart failure/EF 30% -compensated from a respiratory standpoint -Cards feels is due to Takatsubo Syndrome therefore should be reversible -no prior ECHO for review so unclear if this is actually a progressive issue due to ischemia or HTN    Hypotension -seemed due to DH-better but BP still soft so will check OVS -No UOP (bladder scan confirmed) so was hydrated -PCWP was elevated on cath but no significant UOP after 2 doses Lasix so suspect actually needed volume replacement in setting of acute DCM   Leukocytosis -Likely related to  acute fracture -UA not c/w UTI -consider dc anbx's    HYPERTENSION -BP soft and in the 90-100 range    CKD (chronic kidney disease) stage 2, GFR 60-89 ml/min -current GFR in the CKD 4 range so suspect ARF due to [facility] and recent Hypotension -follow lytes    Closed, comminuted fracture upper end of humerus -Ortho following and d/w patient the following: *she can come out of the sling to perform gentle hand wrist and elbow range of motion activities.  *No range of motion of the shoulder is allowed.  *Will consult PT and OT. At baseline she ambulated with a walker.  *Discussed with patient and room guest that SNF placement may be necessary.  *OT to work on gentle hand, wrist, elbow ROM. No shoulder ROM. Sling education  *She will follow up with Dr. Ave Filter at [facility] orthopedics in 2 weeks -can treat non operatively with immobilization    Anemia, unspecified -hgb baseline around 9-10   DVT prophylaxis: Subcutaneous heparin Code Status: Full Family Communication: Spoke with very close friend of the patient  [name] [phone]) at bedside with pt permission Disposition Plan/Expected LOS: Transfer to Telemetry. Still following volume status and mobility. DC dispo pending re: ALF vs SNF-was from Independent Living Isolation: None Nutritional Status: Appears to have acute on chronic protein calorie malnutrition. Seems to have a degree of dementia and may not be eating adequately prior to admission.  Consultants: Cardiology Orthopedics  Procedures: Cardiac catheterization pending 2-D echocardiogram pending  Antibiotics: Zosyn 7/29 >>> Vancomycin 7/29 >>>  HPI/Subjective: Patient awake and alert. Endorsed shoulder pain with activity.   Objective: Blood pressure 95/49, pulse 86, temperature 98.3 F (36.8 C), temperature source Oral,  resp. rate 17, height 5\' 2"  (1.575 m), weight 93.1 kg (205 lb 4 oz), SpO2 99.00%.  Intake/Output Summary (Last 24 hours) at 09/12/12  1202 Last data filed at 09/12/12 0900  Gross per 24 hour  Intake    743 ml  Output    900 ml  Net   -157 ml     Exam: General: No acute respiratory distress Lungs: Clear to auscultation bilaterally without wheezes or crackles, RA Cardiovascular: Regular rate and rhythm without murmur gallop or rub normal S1 and S2, no peripheral edema or JVD Abdomen: Nontender, nondistended, soft, bowel sounds positive, no rebound, no ascites, no appreciable mass Musculoskeletal: No significant cyanosis, clubbing of bilateral lower extremities Neurological: Alert and oriented x name, moves all extremities x 4 without focal neurological deficits, CN 2-12 intact  Scheduled Meds: Scheduled Meds: . allopurinol  100 mg Oral Daily  . aspirin  81 mg Oral Daily  . atorvastatin  80 mg Oral q1800  . famotidine  20 mg Oral Daily  . heparin  5,000 Units Subcutaneous Q8H  . metoprolol tartrate  12.5 mg Oral BID  .  morphine injection  2 mg Intravenous Once  . sodium chloride  3 mL Intravenous Q12H   Continuous Infusions:    **Reviewed in detail by the Attending Physician   Data Reviewed: Basic Metabolic Panel:  Recent Labs Lab 09/10/12 1346 09/10/12 2056 09/11/12 1554 09/12/12 0755  NA 145 141  --  141  K 3.9 5.2*  --  4.6  CL 107 108  --  107  CO2 22 20  --  20  GLUCOSE 118* 118*  --  109*  BUN 20 22  --  28*  CREATININE 1.40* 1.40* 1.64* 1.85*  CALCIUM 9.8 8.8  --  8.3*  MG  --  1.9  --   --    Liver Function Tests:  Recent Labs Lab 09/10/12 1346 09/10/12 2056  AST 21 31  ALT 6 7  ALKPHOS 92 79  BILITOT 0.2* 0.3  PROT 7.1 6.4  ALBUMIN 3.9 3.3*   No results found for this basename: LIPASE, AMYLASE,  in the last 168 hours No results found for this basename: AMMONIA,  in the last 168 hours CBC:  Recent Labs Lab 09/10/12 1346 09/10/12 2056 09/11/12 1335 09/11/12 1554 09/12/12 0755  WBC 19.7* 15.0* 13.6* 16.2* 18.9*  NEUTROABS 17.9*  --   --   --   --   HGB 10.3* 9.7*  8.7* 9.2* 9.1*  HCT 32.0* 29.6* 26.6* 28.6* 28.3*  MCV 100.0 97.0 97.4 97.9 98.3  PLT 297 275 280 294 280   Cardiac Enzymes:  Recent Labs Lab 09/10/12 1346 09/10/12 2055 09/11/12 0255 09/11/12 0820  CKTOTAL 240*  --   --   --   TROPONINI 5.96* 6.10* 7.09* 5.16*   BNP (last 3 results) No results found for this basename: PROBNP,  in the last 8760 hours CBG:  Recent Labs Lab 09/11/12 1747 09/11/12 2034 09/11/12 2351 09/12/12 0342 09/12/12 0817  GLUCAP 93 102* 149* 121* 88    Recent Results (from the past 240 hour(s))  CULTURE, BLOOD (ROUTINE X 2)     Status: None   Collection Time    09/10/12  4:00 PM      Result Value Range Status   Specimen Description BLOOD R RADIAL ARTERY   Final   Special Requests BOTTLES DRAWN AEROBIC AND ANAEROBIC 10 CC   Final   Culture  Setup Time 09/10/2012 23:52  Final   Culture     Final   Value:        BLOOD CULTURE RECEIVED NO GROWTH TO DATE CULTURE WILL BE HELD FOR 5 DAYS BEFORE ISSUING A FINAL NEGATIVE REPORT   Report Status PENDING   Incomplete  MRSA PCR SCREENING     Status: None   Collection Time    09/10/12  6:54 PM      Result Value Range Status   MRSA by PCR NEGATIVE  NEGATIVE Final   Comment:            The GeneXpert MRSA Assay (FDA     approved for NASAL specimens     only), is one component of a     comprehensive MRSA colonization     surveillance program. It is not     intended to diagnose MRSA     infection nor to guide or     monitor treatment for     MRSA infections.     Studies:  Recent x-ray studies have been reviewed in detail by the Attending Physician    Junious Silk, ANP [facility] Office  [phone] Pager [phone]  **If unable to reach the above provider after paging please contact the Flow Manager @ [phone]  On-Call/Text Page:      [email or web address]      password [email or web address]  If 7PM-7AM, please contact night-coverage [email or web address] Password [email or web address] 09/12/2012, 12:02 PM   LOS: 2 days   I  have examined the patient, reviewed the chart and modified the above note which I agree with.   Maximillion Gill,MD [phone] 09/12/2012, 4:29 PM

## 2012-09-12 NOTE — Evaluation (Signed)
Physical Therapy Evaluation Patient Details Name: Beth Gutierrez MRN: [id number] DOB: 1921-03-20 Today's Date: 09/12/2012 Time: 1130-1201 PT Time Calculation (min): 31 min  PT Assessment / Plan / Recommendation History of Present Illness  79 year old female resident of independent living multiple medical problems including known CAD and hypertension. Was sent to the emergency department after falling at home. Based on history patient apparently had a mechanical fall said she denied a sensation of feeling dizzy or any true syncope. She has significant pain in the left shoulder. X-ray revealed a nondisplaced comminuted impacted humeral neck fracture. Because of her history of coronary disease cardiac isoenzymes were checked and her troponin was elevated at 5.96. EKG was nonischemic.  Pt found to have NSTEMI.   Clinical Impression  Pt moving well and able to get OOB.  Pt needed total (A) to perform pericare.   Pt will benefit from acute PT services to improve overall mobility to prepare for safe d/c to next venue.     PT Assessment  Patient needs continued PT services    Follow Up Recommendations  SNF    Equipment Recommendations  None recommended by PT    Frequency Min 3X/week    Precautions / Restrictions Precautions Precautions: Fall Required Braces or Orthoses: Sling (left UE sling) Restrictions Weight Bearing Restrictions: Yes LLE Weight Bearing: Non weight bearing   Pertinent Vitals/Pain Mild c/o pain; however moans at time due to anticipating pain; pt does not rate      Mobility  Bed Mobility Bed Mobility: Supine to Sit Supine to Sit: 1: +2 Total assist;With rails Supine to Sit: Patient Percentage: 60% Details for Bed Mobility Assistance: +2 (A) to elevate trunk OOB with cues for technique Transfers Transfers: Sit to Stand;Stand to Sit;Stand Pivot Transfers Sit to Stand: 1: +2 Total assist;From bed Sit to Stand: Patient Percentage: 60% Stand to Sit: 1: +2  Total assist;To chair/3-in-1 Stand to Sit: Patient Percentage: 60% Stand Pivot Transfers: 1: +2 Total assist Stand Pivot Transfers: Patient Percentage: 60% Details for Transfer Assistance: +2 (A) to maintain balance with cues for technique. (A) to initiate transfer and slowly descend with cues for hand placement Ambulation/Gait Ambulation/Gait Assistance: Not tested (comment)    Exercises     PT Diagnosis: Difficulty walking;Acute pain;Generalized weakness  PT Problem List: Decreased strength;Decreased activity tolerance;Decreased balance;Decreased mobility;Decreased coordination;Decreased knowledge of use of DME;Pain PT Treatment Interventions: DME instruction;Gait training;Functional mobility training;Therapeutic activities;Therapeutic exercise;Balance training;Neuromuscular re-education;Patient/family education     PT Goals(Current goals can be found in the care plan section) Acute Rehab PT Goals Patient Stated Goal: To get moving PT Goal Formulation: With patient Time For Goal Achievement: 09/19/12 Potential to Achieve Goals: Good  Visit Information  Last PT Received On: 09/12/12 Assistance Needed: +2 (safety) History of Present Illness: 79 year old female resident of independent living multiple medical problems including known CAD and hypertension. Was sent to the emergency department after falling at home. Based on history patient apparently had a mechanical fall said she denied a sensation of feeling dizzy or any true syncope. She has significant pain in the left shoulder. X-ray revealed a nondisplaced comminuted impacted humeral neck fracture. Because of her history of coronary disease cardiac isoenzymes were checked and her troponin was elevated at 5.96. EKG was nonischemic.  Pt found to have NSTEMI.        Prior Functioning  Home Living Family/patient expects to be discharged to:: Skilled nursing facility Prior Function Level of Independence: Independent with assistive  device(s) (Ambulates with RW) Communication Communication:  No difficulties Dominant Hand: Right    Cognition  Cognition Arousal/Alertness: Awake/alert Behavior During Therapy: WFL for tasks assessed/performed Overall Cognitive Status: Within Functional Limits for tasks assessed    Extremity/Trunk Assessment Upper Extremity Assessment Upper Extremity Assessment: LUE deficits/detail LUE: Unable to fully assess due to pain Lower Extremity Assessment Lower Extremity Assessment: Generalized weakness   Balance Balance Balance Assessed: Yes Static Sitting Balance Static Sitting - Balance Support: Feet supported Static Sitting - Level of Assistance: 5: Stand by assistance Static Sitting - Comment/# of Minutes: ~5 minutes to maintain balance and   End of Session PT - End of Session Equipment Utilized During Treatment: Gait belt Activity Tolerance: Patient tolerated treatment well Patient left: in chair;with call bell/phone within reach Nurse Communication: Mobility status  GP     Teran Knittle 09/12/2012, 1:04 PM  Jake Shark, PT DPT [phone]

## 2012-09-12 NOTE — Consult Note (Signed)
Patient seen in conjunction with PA Damita Lack. Agree with assessment and plan above. This should do fine with conservative treatment in a sling. She should have hand wrist and elbow motion only over the next 4 weeks. She can followup with me in my office after discharge 4 weeks from the date of her injury to repeat x-rays and get her out of her sling. Given her preinjury dependent on a walker it is likely she is going to require significantly increased care and may need skilled nursing facility.

## 2012-09-12 NOTE — Progress Notes (Signed)
Patient ID: Beth Gutierrez, female   DOB: 1921-05-18, 79 y.o.   MRN: [id number]    Subjective:  No chest pain Some confusion and agitation last night  Objective:  Filed Vitals:   09/12/12 0649 09/12/12 0650 09/12/12 0700 09/12/12 0800  BP: 86/38 107/50 110/47 107/55  Pulse:    85  Temp:    98.1 F (36.7 C)  TempSrc:    Oral  Resp: 15 25 17    Height:      Weight:      SpO2: 100% 98% 100% 100%    Intake/Output from previous day:  Intake/Output Summary (Last 24 hours) at 09/12/12 0840 Last data filed at 09/12/12 0600  Gross per 24 hour  Intake    626 ml  Output    900 ml  Net   -274 ml    Physical Exam: Affect appropriate Obese female HEENT: normal Neck supple with no adenopathy JVP normal no bruits no thyromegaly Lungs basilar crackles with no wheezing  Heart:  S1/S2 no murmur, no rub, gallop or click PMI normal Abdomen: benighn, BS positve, no tenderness, no AAA no bruit.  No HSM or HJR Distal pulses intact with no bruits No edema Neuro non-focal Skin warm and dry Left humeral neck fracture    Lab Results: Basic Metabolic Panel:  Recent Labs  40/98/11 1346 09/10/12 2056 09/11/12 1554  NA 145 141  --   K 3.9 5.2*  --   CL 107 108  --   CO2 22 20  --   GLUCOSE 118* 118*  --   BUN 20 22  --   CREATININE 1.40* 1.40* 1.64*  CALCIUM 9.8 8.8  --   MG  --  1.9  --    Liver Function Tests:  Recent Labs  09/10/12 1346 09/10/12 2056  AST 21 31  ALT 6 7  ALKPHOS 92 79  BILITOT 0.2* 0.3  PROT 7.1 6.4  ALBUMIN 3.9 3.3*   CBC:  Recent Labs  09/10/12 1346  09/11/12 1554 09/12/12 0755  WBC 19.7*  < > 16.2* 18.9*  NEUTROABS 17.9*  --   --   --   HGB 10.3*  < > 9.2* 9.1*  HCT 32.0*  < > 28.6* 28.3*  MCV 100.0  < > 97.9 98.3  PLT 297  < > 294 280  < > = values in this interval not displayed. Cardiac Enzymes:  Recent Labs  09/10/12 1346 09/10/12 2055 09/11/12 0255 09/11/12 0820  CKTOTAL 240*  --   --   --   TROPONINI 5.96* 6.10*  7.09* 5.16*   Hemoglobin A1C:  Recent Labs  09/10/12 2056  HGBA1C 5.2   Thyroid Function Tests:  Recent Labs  09/10/12 2056  TSH 0.889    Imaging: Dg Chest 1 View  09/10/2012   *RADIOLOGY REPORT*  Clinical Data: Unwitnessed fall last night.  Left humeral fracture.  CHEST - 1 VIEW  Comparison: 06/03/2012.  Findings: There are lower lung volumes and more lordotic positioning currently.  Allowing for this, the heart size and mediastinal contours are stable.  There is increased linear atelectasis at the right lung base.  The left lung appears clear. Mildly comminuted fracture of the left humeral neck is noted.  No other acute osseous findings are identified.  Glenohumeral degenerative changes are present bilaterally.  IMPRESSION:  Low lung volumes with resulting right basilar atelectasis.  Left humeral neck fracture.   Original Report Authenticated By: Carey Bullocks, M.D.   Dg Hip Complete  Right  09/10/2012   *RADIOLOGY REPORT*  Clinical Data: Larey Seat yesterday.  Right hip pain.  RIGHT HIP - COMPLETE 2+ VIEW  Comparison: None.  Findings: No evidence of acute fracture or dislocation.  Severe joint space narrowing with associated hypertrophic spurring involving the acetabulum and the femoral head.  Moderate osseous demineralization.  Included AP pelvis demonstrates a prior left total hip arthroplasty with anatomic alignment and no complicating features.  Symphysis pubis intact.  Sacroiliac joints intact with degenerative changes. Degenerative changes involving the visualized lower lumbar spine.  IMPRESSION: No acute osseous abnormality.  Severe osteoarthritis involving the right hip.   Original Report Authenticated By: Hulan Saas, M.D.   Ct Head Wo Contrast  09/10/2012   *RADIOLOGY REPORT*  Clinical Data: Fall  CT HEAD WITHOUT CONTRAST  Technique:  Contiguous axial images were obtained from the base of the skull through the vertex without contrast.  Comparison: none  Findings: Generalized  atrophy.  Chronic microvascular ischemic change in the white matter.  Negative for acute infarct.  Negative for hemorrhage mass or skull fracture.  Sinusitis with air-fluid levels in the left ethmoid and sphenoid sinuses as well as chronic mucosal thickening and bony thickening of the left ethmoid and sphenoid sinus.  IMPRESSION: Atrophy and chronic microvascular ischemic change.  No acute intracranial abnormality.  Sinusitis.   Original Report Authenticated By: Janeece Riggers, M.D.   Dg Shoulder Left  09/10/2012   *RADIOLOGY REPORT*  Clinical Data: Shoulder pain status post fall.  LEFT SHOULDER - 2+ VIEW  Comparison: None.  Findings: There is a comminuted and mildly displaced acute fracture involving the left humeral neck.  No involvement of the humeral head articular surface is identified.  There is no dislocation. There are underlying advanced glenohumeral degenerative changes. The subacromial space is preserved.  IMPRESSION: Comminuted fracture of the left humeral neck.  Severe underlying glenohumeral degenerative changes.   Original Report Authenticated By: Carey Bullocks, M.D.   Dg Humerus Left  09/10/2012   *RADIOLOGY REPORT*  Clinical Data: Fall yesterday.  Left humeral neck fracture identified on earlier shoulder imaging.  LEFT HUMERUS - 2+ VIEW  Comparison: Left shoulder x-rays obtained earlier same date.  Findings: Comminuted impacted fracture involving the humeral neck as noted on the shoulder x-rays.  No fractures elsewhere involving the humerus.  Elbow joint intact with well-preserved joint spaces. Generalized osseous demineralization.  IMPRESSION: Left humeral neck fracture as noted on the earlier shoulder imaging.  No fractures elsewhere involving the left humerus.   Original Report Authenticated By: Hulan Saas, M.D.   Dg Hand Complete Left  09/10/2012   *RADIOLOGY REPORT*  Clinical Data: Hand injury status post fall last night.  LEFT HAND - COMPLETE 3+ VIEW  Comparison: None.  Findings:  The bones appear mildly demineralized.  There is no evidence of acute fracture or dislocation.  There are moderately advanced degenerative changes at the first carpal metacarpal articulation. Mild degenerative changes are noted throughout the metacarpal phalangeal joints.  No focal soft tissue swelling is evident.  IMPRESSION: No acute osseous findings.  Degenerative changes as described.   Original Report Authenticated By: Carey Bullocks, M.D.    Cardiac Studies:  ECG:  SR inferolateral T wave changes J point elevation in I and AVL   Telemetry:  NSR no VT  Echo: EF 25-30 % consistent with Taka Tsubo DCM  Medications:   . allopurinol  100 mg Oral Daily  . aspirin  81 mg Oral Daily  . atorvastatin  80 mg Oral  q1800  . famotidine  20 mg Oral Daily  . heparin  5,000 Units Subcutaneous Q8H  . metoprolol tartrate  12.5 mg Oral BID  .  morphine injection  2 mg Intravenous Once  . piperacillin-tazobactam (ZOSYN)  IV  3.375 g Intravenous Q8H  . sodium chloride  3 mL Intravenous Q12H  . vancomycin  1,000 mg Intravenous Q24H        Assessment/Plan:  CAD:  Cath with no acute occlusion.  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 PRN lasix if she gets dyspnic Chol:  Continue statin Ortho:  Left humeral fracture No surgery planned   Echo pending  Charlton Haws 09/12/2012, 8:40 AM

## 2012-09-12 NOTE — Progress Notes (Signed)
PATIENT ID: Beth Gutierrez   1 Day Post-Op Procedure(s) (LRB): LEFT HEART CATHETERIZATION WITH CORONARY ANGIOGRAM (N/A)  Subjective: reports being comfortable in bed, left shoulder pain with any movement. She is relieved by the negative heart catheterization.  Objective:  Filed Vitals:   09/12/12 0800  BP: 107/55  Pulse: 85  Temp: 98.1 F (36.7 C)  Resp:      Left upper extremity with intact skin Mild ecchymosis around the axilla Sling repositioned, pain with movement of the left arm No distal swelling  Labs:   Recent Labs  09/10/12 1346 09/10/12 2056 09/11/12 1335 09/11/12 1554 09/12/12 0755  HGB 10.3* 9.7* 8.7* 9.2* 9.1*   Recent Labs  09/11/12 1554 09/12/12 0755  WBC 16.2* 18.9*  RBC 2.92* 2.88*  HCT 28.6* 28.3*  PLT 294 280   Recent Labs  09/10/12 2056 09/11/12 1554 09/12/12 0755  NA 141  --  141  K 5.2*  --  4.6  CL 108  --  107  CO2 20  --  20  BUN 22  --  28*  CREATININE 1.40* 1.64* 1.85*  GLUCOSE 118*  --  109*  CALCIUM 8.8  --  8.3*    Assessment and Plan: Left shoulder nondisplaced comminuted impacted Humeral neck fracture  tx with immobilization in sling Discussed with the patient that she can come out of the sling to perform gentle hand wrist and elbow range of motion activities. No range of motion of the shoulder is allowed.  Will consult PT and OT. At baseline she ambulated with a walker. Discussed with patient and room guest that SNF placement may be necessary. OT to work on gentle hand, wrist, elbow ROM. No shoulder ROM. Sling edu She will follow up with Dr. Ave Filter at [facility] orthopedics in 2 weeks  VTE proph: per primary team

## 2012-09-13 LAB — BASIC METABOLIC PANEL
Calcium: 8.3 mg/dL — ABNORMAL LOW (ref 8.4–10.5)
Chloride: 108 mEq/L (ref 96–112)
Creatinine, Ser: 2.03 mg/dL — ABNORMAL HIGH (ref 0.50–1.10)
GFR calc Af Amer: 24 mL/min — ABNORMAL LOW (ref >90)
GFR calc non Af Amer: 21 mL/min — ABNORMAL LOW (ref >90)

## 2012-09-13 LAB — GLUCOSE, CAPILLARY: Glucose-Capillary: 89 mg/dL (ref 70–99)

## 2012-09-13 LAB — RETICULOCYTES
Retic Count, Absolute: 71.2 10*3/uL (ref 19.0–186.0)
Retic Ct Pct: 2.6 % (ref 0.4–3.1)

## 2012-09-13 LAB — URINALYSIS W MICROSCOPIC + REFLEX CULTURE
Ketones, ur: NEGATIVE mg/dL
Leukocytes, UA: NEGATIVE
Nitrite: NEGATIVE
Specific Gravity, Urine: 1.024 (ref 1.005–1.030)
Urobilinogen, UA: 1 mg/dL (ref 0.0–1.0)
pH: 5 (ref 5.0–8.0)

## 2012-09-13 LAB — FERRITIN: Ferritin: 154 ng/mL (ref 10–291)

## 2012-09-13 LAB — CBC
Platelets: 263 10*3/uL (ref 150–400)
RDW: 16.5 % — ABNORMAL HIGH (ref 11.5–15.5)
WBC: 13.4 10*3/uL — ABNORMAL HIGH (ref 4.0–10.5)

## 2012-09-13 LAB — IRON AND TIBC: UIBC: 205 ug/dL (ref 125–400)

## 2012-09-13 MED ORDER — SODIUM CHLORIDE 0.9 % IV SOLN: 250.0000 mL | INTRAVENOUS | Status: DC | PRN

## 2012-09-13 MED ORDER — SODIUM CHLORIDE 0.9 % IV SOLN: 250.0000 mL | INTRAVENOUS | Status: AC | PRN

## 2012-09-13 NOTE — Progress Notes (Addendum)
[facility]                                                                                Patient Demographics  Beth Gutierrez, is a 79 y.o. female, DOB - [birth year], ZOX:[id number], WJX:[id number]  Admit date - 09/10/2012  Admitting Physician Clydia Llano, MD  Outpatient Primary MD for the patient is Oliver Barre, MD  LOS - 3   Chief Complaint  Patient presents with  . Fall  . Arm Injury        Assessment & Plan    Brief narrative:  79 year old female resident of independent living multiple medical problems including known CAD and hypertension. Was sent to the emergency department after falling at home. Based on history patient apparently had a mechanical fall said she denied a sensation of feeling dizzy or any true syncope. She has significant pain in the left shoulder. X-ray revealed a nondisplaced comminuted impacted humeral neck fracture. Because of her history of coronary disease cardiac isoenzymes were checked and her troponin was elevated at 5.96. EKG was nonischemic.     Assessment and plan   CORONARY ARTERY DISEASE/NSTEMI (non-ST elevated myocardial infarction),With hypotension and chronic systolic CHF with EF 30%.   Echo and cardiac catheterization noted, per cardiology nonsignificant coronary artery disease,Possibility of Takotsubo cardiomyopathy, continue supportive care, in the light of acute renal failure holding ACE/ARB,  Aspirin, low-dose beta blocker as tolerated by her blood pressure along with statin continued.    Falls/question of syncope  -Likely due to #1 above with hypertension. PT OT eval, telemetry monitoring. She is clinically improving with supportive care.    Chr systolic heart failure/EF 30%  -compensated from a respiratory standpoint  -Cards feels is due to Takatsubo Syndrome therefore should be reversible  -No A./ARB secondary to acute renal failure, gentle IV fluids today, she appears compensated for this  problem.    Leukocytosis  She is afebrile, UA chest x-ray stable, leukocytosis likely from the stress of fall and left femoral fracture.Trend is definitely improving.    HYPERTENSION  -BP Gradually improving, continue to hold antihypertensive medications including beta blocker, ACE/ARB especially in the setting of acute renal failure with hypotension.    ARF on CKD (chronic kidney disease) stage 2, GFR 60-89 ml/min, Baselinecreatinine appears to be around 1.4  She did have left heart cath on 09/12/2012, also was hypotensive initially upon admission, hold ACE/ARB, hold Lasix for now, gentle IV fluids, monitor respiratory status closely. Check urine electrolytes repeat BMP in the morning.    Closed, comminuted fracture upper end of humerus  -Ortho following and d/w patient the following:  *she can come out of the sling to perform gentle hand wrist and elbow range of motion activities.  *No range of motion of the shoulder is allowed.  She will follow up with Dr. Ave Filter at [facility] orthopedics in 2 weeks    PT OT and social work consulted, patient lives alone.     Anemia, unspecified  -hgb baseline around 9-10       Code Status: Full  Family Communication: none present  Disposition Plan: TBD   Procedures    Echo  - Left ventricle: Apical inferoapical and anteroapical  akinesis Consistant with Taka Tsubo DCM The cavity size was moderately dilated. Systolic function was severely reduced. The estimated ejection fraction was in the range of 25% to 30%. - Left atrium: The atrium was mildly dilated. - Atrial septum: No defect or patent foramen ovale was identified.    L.Heart cath Dr Shirlee Latch 09-12-12  AD and LCx with nonobstructive disease. RCA with 70% in-stent restenosis, not a critical lesion/good flow down RCA. I did not do an LV-gram due to CKD but the apex did not appear to move well. I query a Takotsubo cardiomyopathy in the setting of fall/humeral fracture.       Consults  Cards, Ortho   DVT Prophylaxis   Heparin   Lab Results  Component Value Date   PLT 263 09/13/2012    Medications  Scheduled Meds: . allopurinol  100 mg Oral Daily  . aspirin  81 mg Oral Daily  . atorvastatin  80 mg Oral q1800  . famotidine  20 mg Oral Daily  . heparin  5,000 Units Subcutaneous Q8H  . metoprolol tartrate  12.5 mg Oral BID  .  morphine injection  2 mg Intravenous Once  . sodium chloride  3 mL Intravenous Q12H   Continuous Infusions:  PRN Meds:.sodium chloride, acetaminophen, HYDROcodone-acetaminophen, morphine injection, nitroGLYCERIN, ondansetron (ZOFRAN) IV, sodium chloride  Antibiotics     Anti-infectives   Start     Dose/Rate Route Frequency Ordered Stop   09/11/12 1700  vancomycin (VANCOCIN) IVPB 1000 mg/200 mL premix  Status:  Discontinued     1,000 mg 200 mL/hr over 60 Minutes Intravenous Every 24 hours 09/10/12 2011 09/12/12 0904   09/10/12 2200  piperacillin-tazobactam (ZOSYN) IVPB 3.375 g  Status:  Discontinued     3.375 g 12.5 mL/hr over 240 Minutes Intravenous 3 times per day 09/10/12 2011 09/12/12 0904   09/10/12 1545  piperacillin-tazobactam (ZOSYN) IVPB 3.375 g     3.375 g 12.5 mL/hr over 240 Minutes Intravenous  Once 09/10/12 1536 09/10/12 1717   09/10/12 1545  vancomycin (VANCOCIN) IVPB 1000 mg/200 mL premix     1,000 mg 200 mL/hr over 60 Minutes Intravenous  Once 09/10/12 1536 09/10/12 1805       Time Spent in minutes   35   Tashae Inda K M.D on 09/13/2012 at 9:16 AM  Between 7am to 7pm - Pager - [phone]  After 7pm go to [email or web address] - password [email or web address]  And look for the night coverage person covering for me after hours  [facility] Office  [phone]    Subjective:   Beth Gutierrez today has, No headache, No chest pain, No abdominal pain - No Nausea, No new weakness tingling or numbness, No Cough - SOB.    Objective:   Filed Vitals:   09/12/12 1300 09/12/12 1355 09/12/12 2212  09/13/12 0534  BP: 87/43 194/65 104/72 115/51  Pulse:  79 86 85  Temp:  98.5 F (36.9 C) 97.9 F (36.6 C) 98.3 F (36.8 C)  TempSrc:  Oral Oral Oral  Resp: 24 20 18 18   Height:      Weight:      SpO2: 100% 94% 94% 92%    Wt Readings from Last 3 Encounters:  09/12/12 93.1 kg (205 lb 4 oz)  09/12/12 93.1 kg (205 lb 4 oz)  06/12/12 90.323 kg (199 lb 2 oz)     Intake/Output Summary (Last 24 hours) at 09/13/12 0916 Last data filed at 09/12/12 1730  Gross per 24 hour  Intake  370 ml  Output    650 ml  Net   -280 ml    Exam Awake Alert, Oriented X 3, No new F.N deficits, Normal affect Morrison.AT,PERRAL Supple Neck,No JVD, No cervical lymphadenopathy appriciated.  Symmetrical Chest wall movement, Good air movement bilaterally, CTAB RRR,No Gallops,Rubs or new Murmurs, No Parasternal Heave +ve B.Sounds, Abd Soft, Non tender, No organomegaly appriciated, No rebound - guarding or rigidity. No Cyanosis, Clubbing or edema, No new Rash or bruise, L arm in immobilizer splint   Data Review   Micro Results Recent Results (from the past 240 hour(s))  CULTURE, BLOOD (ROUTINE X 2)     Status: None   Collection Time    09/10/12  4:00 PM      Result Value Range Status   Specimen Description BLOOD R RADIAL ARTERY   Final   Special Requests BOTTLES DRAWN AEROBIC AND ANAEROBIC 10 CC   Final   Culture  Setup Time 09/10/2012 23:52   Final   Culture     Final   Value:        BLOOD CULTURE RECEIVED NO GROWTH TO DATE CULTURE WILL BE HELD FOR 5 DAYS BEFORE ISSUING A FINAL NEGATIVE REPORT   Report Status PENDING   Incomplete  MRSA PCR SCREENING     Status: None   Collection Time    09/10/12  6:54 PM      Result Value Range Status   MRSA by PCR NEGATIVE  NEGATIVE Final   Comment:            The GeneXpert MRSA Assay (FDA     approved for NASAL specimens     only), is one component of a     comprehensive MRSA colonization     surveillance program. It is not     intended to diagnose MRSA      infection nor to guide or     monitor treatment for     MRSA infections.    Radiology Reports Dg Chest 1 View  09/10/2012   *RADIOLOGY REPORT*  Clinical Data: Unwitnessed fall last night.  Left humeral fracture.  CHEST - 1 VIEW  Comparison: 06/03/2012.  Findings: There are lower lung volumes and more lordotic positioning currently.  Allowing for this, the heart size and mediastinal contours are stable.  There is increased linear atelectasis at the right lung base.  The left lung appears clear. Mildly comminuted fracture of the left humeral neck is noted.  No other acute osseous findings are identified.  Glenohumeral degenerative changes are present bilaterally.  IMPRESSION:  Low lung volumes with resulting right basilar atelectasis.  Left humeral neck fracture.   Original Report Authenticated By: Carey Bullocks, M.D.   Dg Hip Complete Right  09/10/2012   *RADIOLOGY REPORT*  Clinical Data: Larey Seat yesterday.  Right hip pain.  RIGHT HIP - COMPLETE 2+ VIEW  Comparison: None.  Findings: No evidence of acute fracture or dislocation.  Severe joint space narrowing with associated hypertrophic spurring involving the acetabulum and the femoral head.  Moderate osseous demineralization.  Included AP pelvis demonstrates a prior left total hip arthroplasty with anatomic alignment and no complicating features.  Symphysis pubis intact.  Sacroiliac joints intact with degenerative changes. Degenerative changes involving the visualized lower lumbar spine.  IMPRESSION: No acute osseous abnormality.  Severe osteoarthritis involving the right hip.   Original Report Authenticated By: Hulan Saas, M.D.   Ct Head Wo Contrast  09/10/2012   *RADIOLOGY REPORT*  Clinical Data: Fall  CT  HEAD WITHOUT CONTRAST  Technique:  Contiguous axial images were obtained from the base of the skull through the vertex without contrast.  Comparison: none  Findings: Generalized atrophy.  Chronic microvascular ischemic change in the white matter.   Negative for acute infarct.  Negative for hemorrhage mass or skull fracture.  Sinusitis with air-fluid levels in the left ethmoid and sphenoid sinuses as well as chronic mucosal thickening and bony thickening of the left ethmoid and sphenoid sinus.  IMPRESSION: Atrophy and chronic microvascular ischemic change.  No acute intracranial abnormality.  Sinusitis.   Original Report Authenticated By: Janeece Riggers, M.D.   Dg Shoulder Left  09/10/2012   *RADIOLOGY REPORT*  Clinical Data: Shoulder pain status post fall.  LEFT SHOULDER - 2+ VIEW  Comparison: None.  Findings: There is a comminuted and mildly displaced acute fracture involving the left humeral neck.  No involvement of the humeral head articular surface is identified.  There is no dislocation. There are underlying advanced glenohumeral degenerative changes. The subacromial space is preserved.  IMPRESSION: Comminuted fracture of the left humeral neck.  Severe underlying glenohumeral degenerative changes.   Original Report Authenticated By: Carey Bullocks, M.D.   Dg Humerus Left  09/10/2012   *RADIOLOGY REPORT*  Clinical Data: Fall yesterday.  Left humeral neck fracture identified on earlier shoulder imaging.  LEFT HUMERUS - 2+ VIEW  Comparison: Left shoulder x-rays obtained earlier same date.  Findings: Comminuted impacted fracture involving the humeral neck as noted on the shoulder x-rays.  No fractures elsewhere involving the humerus.  Elbow joint intact with well-preserved joint spaces. Generalized osseous demineralization.  IMPRESSION: Left humeral neck fracture as noted on the earlier shoulder imaging.  No fractures elsewhere involving the left humerus.   Original Report Authenticated By: Hulan Saas, M.D.   Dg Hand Complete Left  09/10/2012   *RADIOLOGY REPORT*  Clinical Data: Hand injury status post fall last night.  LEFT HAND - COMPLETE 3+ VIEW  Comparison: None.  Findings: The bones appear mildly demineralized.  There is no evidence of acute  fracture or dislocation.  There are moderately advanced degenerative changes at the first carpal metacarpal articulation. Mild degenerative changes are noted throughout the metacarpal phalangeal joints.  No focal soft tissue swelling is evident.  IMPRESSION: No acute osseous findings.  Degenerative changes as described.   Original Report Authenticated By: Carey Bullocks, M.D.    [facility]  Recent Labs Lab 09/10/12 1346 09/10/12 2056 09/11/12 1335 09/11/12 1554 09/12/12 0755 09/13/12 0515  WBC 19.7* 15.0* 13.6* 16.2* 18.9* 13.4*  HGB 10.3* 9.7* 8.7* 9.2* 9.1* 8.4*  HCT 32.0* 29.6* 26.6* 28.6* 28.3* 25.6*  PLT 297 275 280 294 280 263  MCV 100.0 97.0 97.4 97.9 98.3 97.7  MCH 32.2 31.8 31.9 31.5 31.6 32.1  MCHC 32.2 32.8 32.7 32.2 32.2 32.8  RDW 15.7* 16.4* 16.6* 16.6* 16.3* 16.5*  LYMPHSABS 1.0  --   --   --   --   --   MONOABS 0.8  --   --   --   --   --   EOSABS 0.0  --   --   --   --   --   BASOSABS 0.0  --   --   --   --   --     Chemistries   Recent Labs Lab 09/10/12 1346 09/10/12 2056 09/11/12 1554 09/12/12 0755 09/13/12 0515  NA 145 141  --  141 142  K 3.9 5.2*  --  4.6 4.4  CL  107 108  --  107 108  CO2 22 20  --  20 21  GLUCOSE 118* 118*  --  109* 92  BUN 20 22  --  28* 35*  CREATININE 1.40* 1.40* 1.64* 1.85* 2.03*  CALCIUM 9.8 8.8  --  8.3* 8.3*  MG  --  1.9  --   --   --   AST 21 31  --   --   --   ALT 6 7  --   --   --   ALKPHOS 92 79  --   --   --   BILITOT 0.2* 0.3  --   --   --    ------------------------------------------------------------------------------------------------------------------ estimated creatinine clearance is 20 ml/min (by C-G formula based on Cr of 2.03). ------------------------------------------------------------------------------------------------------------------  Recent Labs  09/10/12 2056  HGBA1C 5.2   ------------------------------------------------------------------------------------------------------------------ No results  found for this basename: CHOL, HDL, LDLCALC, TRIG, CHOLHDL, LDLDIRECT,  in the last 72 hours ------------------------------------------------------------------------------------------------------------------  Recent Labs  09/10/12 2056  TSH 0.889   ------------------------------------------------------------------------------------------------------------------ No results found for this basename: VITAMINB12, FOLATE, FERRITIN, TIBC, IRON, RETICCTPCT,  in the last 72 hours  Coagulation profile  Recent Labs Lab 09/10/12 2056  INR 1.08    No results found for this basename: DDIMER,  in the last 72 hours  Cardiac Enzymes  Recent Labs Lab 09/10/12 2055 09/11/12 0255 09/11/12 0820  TROPONINI 6.10* 7.09* 5.16*   ------------------------------------------------------------------------------------------------------------------ No components found with this basename: POCBNP,

## 2012-09-13 NOTE — Progress Notes (Signed)
Clinical Social Work Department CLINICAL SOCIAL WORK PLACEMENT NOTE 09/13/2012  Patient:  Beth Gutierrez, Beth Gutierrez  Account Number:  [id number] Admit date:  09/10/2012  Clinical Social Worker:  Carren Rang  Date/time:  09/13/2012 11:45 AM  Clinical Social Work is seeking post-discharge placement for this patient at the following level of care:   SKILLED NURSING   (*CSW will update this form in Epic as items are completed)   09/13/2012  Patient/family provided with [facility] Department of Clinical Social Work's list of facilities offering this level of care within the geographic area requested by the patient (or if unable, by the patient's family).  09/13/2012  Patient/family informed of their freedom to choose among providers that offer the needed level of care, that participate in Medicare, Medicaid or managed care program needed by the patient, have an available bed and are willing to accept the patient.  09/13/2012  Patient/family informed of [facility]' ownership interest in [facility], as well as of the fact that they are under no obligation to receive care at this facility.  PASARR submitted to EDS on 09/13/2012 PASARR number received from EDS on 09/13/2012  FL2 transmitted to all facilities in geographic area requested by pt/family on  09/13/2012 FL2 transmitted to all facilities within larger geographic area on   Patient informed that his/her managed care company has contracts with or will negotiate with  certain facilities, including the following:     Patient/family informed of bed offers received:   Patient chooses bed at  Physician recommends and patient chooses bed at    Patient to be transferred to  on   Patient to be transferred to facility by   The following physician request were entered in Epic:   Additional Comments:  Maree Krabbe, MSW, [employer] [phone]

## 2012-09-13 NOTE — Progress Notes (Signed)
Orthopedic Tech Progress Note Patient Details:  Beth Gutierrez 1921-10-28 [id number] rn to apply Ortho Devices Type of Ortho Device: Shoulder immobilizer Ortho Device/Splint Interventions: Casandra Doffing 09/13/2012, 5:48 PM

## 2012-09-13 NOTE — Progress Notes (Signed)
Clinical Social Work Department BRIEF PSYCHOSOCIAL ASSESSMENT 09/13/2012  Patient:  Beth Gutierrez, Beth Gutierrez     Account Number:  [id number]     Admit date:  09/10/2012  Clinical Social Worker:  Carren Rang  Date/Time:  09/13/2012 11:39 AM  Referred by:  Care Management  Date Referred:  09/13/2012 Referred for  SNF Placement   Other Referral:   Interview type:  Other - See comment Other interview type:   Spoke with patient and friend at bedside, Beth Gutierrez. [phone]    PSYCHOSOCIAL DATA Living Status:  FACILITY Admitted from facility:  OTHER Level of care:  Independent Living Primary support name:  Beth Gutierrez [phone] Primary support relationship to patient:  FAMILY Degree of support available:   Good    CURRENT CONCERNS Current Concerns  Post-Acute Placement   Other Concerns:    SOCIAL WORK ASSESSMENT / PLAN Clinical Social Worker received referral for SNF placement at d/c. CSW introduced self and explained reason for visit. Patient had visitor by bedside, Beth Gutierrez (friend). CSW explained SNF process and provided SNF packet to patient and friend. Friend and patient reported they understand the need for SNF placement and agreeable to fax out in [facility]. Friend stated the preference was [facility], but wants CSW to call POA, Beth Gutierrez (Gutierrez). CSW will contact Gutierrez and will complete FL2 for MD's signature and will update patient and family when bed offers are received.   Assessment/plan status:  Psychosocial Support/Ongoing Assessment of Needs Other assessment/ plan:   Information/referral to community resources:   SNF packet    PATIENT'S/FAMILY'S RESPONSE TO PLAN OF CARE: Patient stated that her friend is "the best there is". Friend stated that she helps take care of patient bc her children argue over POA. Friend stated to contact POA and let her make the decisions.      Beth Gutierrez, MSW, Beth Gutierrez [phone]

## 2012-09-13 NOTE — Progress Notes (Addendum)
Occupational Therapy Evaluation Patient Details Name: Beth Gutierrez MRN: [id number] DOB: 1921/03/02 Today's Date: 09/13/2012 Time: 6295-2841 OT Time Calculation (min): 24 min  OT Assessment / Plan / Recommendation History of present illness 79 year old female resident of independent living multiple medical problems including known CAD and hypertension. Was sent to the emergency department after falling at home. Based on history patient apparently had a mechanical fall said she denied a sensation of feeling dizzy or any true syncope. She has significant pain in the left shoulder. X-ray revealed a nondisplaced comminuted impacted humeral neck fracture. Because of her history of coronary disease cardiac isoenzymes were checked and her troponin was elevated at 5.96. EKG was nonischemic.  Pt found to have NSTEMI.    Clinical Impression   PTA pt lived in AL type setting and was independent with ADL and mobility. Pt will benefit from skilled OT services to facilitate D/C to next venue due to below deficits. Pt repositioned correctly in sling. Asked nsg to order waist strap for sling as this will limit shoulder movement and decrease pain with mobility. Attempted elbow ROM within pain tolerance (@total20  degrees), wrist and hand AROM. Ice to L shoulder at end of session. Pt will need SNF for rehab. Instructions/precautions noted on room board for nsg.    OT Assessment  Patient needs continued OT Services    Follow Up Recommendations  SNF    Barriers to Discharge Decreased caregiver support    Equipment Recommendations  None recommended by OT    Recommendations for Other Services    Frequency  Min 2X/week    Precautions / Restrictions Precautions Precautions: Fall Required Braces or Orthoses: Sling (left UE sling) Restrictions Weight Bearing Restrictions: Yes LUE Weight Bearing: Non weight bearing   Pertinent Vitals/Pain Did not rate but apparent pain LUE. nsg aware. Repositioned/ice     ADL  Eating/Feeding: Minimal assistance Where Assessed - Eating/Feeding: Chair Grooming: Moderate assistance Where Assessed - Grooming: Supported sitting Upper Body Bathing: Maximal assistance Where Assessed - Upper Body Bathing: Supported sitting Lower Body Bathing: Maximal assistance Where Assessed - Lower Body Bathing: Supported sit to stand Upper Body Dressing: Maximal assistance Where Assessed - Upper Body Dressing: Supported sitting Lower Body Dressing: Maximal assistance Where Assessed - Lower Body Dressing: Supported sit to Pharmacist, hospital: Moderate assistance Toilet Transfer Method: Sit to stand Toilet Transfer Equipment: Bedside commode Toileting - Clothing Manipulation and Hygiene: Maximal assistance Equipment Used: Other (comment) (sling) Transfers/Ambulation Related to ADLs: mod A sit - stand ADL Comments: lmited by painand immobility. sling repositioned properly    OT Diagnosis: Generalized weakness;Acute pain  OT Problem List: Decreased strength;Decreased range of motion;Decreased activity tolerance;Impaired balance (sitting and/or standing);Decreased safety awareness;Decreased knowledge of use of DME or AE;Decreased knowledge of precautions;Obesity;Pain;Impaired UE functional use OT Treatment Interventions: Self-care/ADL training;Therapeutic exercise;Therapeutic activities;Patient/family education   OT Goals(Current goals can be found in the care plan section) Acute Rehab OT Goals Patient Stated Goal: To get moving OT Goal Formulation: With patient Time For Goal Achievement: 09/27/12 Potential to Achieve Goals: Good  Visit Information  Last OT Received On: 09/13/12 Assistance Needed: +1 (safety) History of Present Illness: 79 year old female resident of independent living multiple medical problems including known CAD and hypertension. Was sent to the emergency department after falling at home. Based on history patient apparently had a mechanical fall said  she denied a sensation of feeling dizzy or any true syncope. She has significant pain in the left shoulder. X-ray revealed a nondisplaced comminuted impacted humeral  neck fracture. Because of her history of coronary disease cardiac isoenzymes were checked and her troponin was elevated at 5.96. EKG was nonischemic.  Pt found to have NSTEMI.        Prior Functioning     Home Living Family/patient expects to be discharged to:: Skilled nursing facility Prior Function Level of Independence: Independent with assistive device(s) (Ambulates with RW) Communication Communication: No difficulties Dominant Hand: Right         Vision/Perception     Cognition  Cognition Arousal/Alertness: Awake/alert Behavior During Therapy: WFL for tasks assessed/performed Overall Cognitive Status: Within Functional Limits for tasks assessed    Extremity/Trunk Assessment Upper Extremity Assessment Upper Extremity Assessment: LUE deficits/detail LUE Deficits / Details: L humeral fracture - slng x4weeks Lower Extremity Assessment Lower Extremity Assessment: Generalized weakness     Mobility Bed Mobility Bed Mobility: Not assessed Transfers Transfers: Sit to Stand;Stand to Sit Sit to Stand: 3: Mod assist Stand to Sit: 3: Mod assist Details for Transfer Assistance: +2 (A) to maintain balance with cues for technique. (A) to initiate transfer and slowly descend with cues for hand placement     Exercise Other Exercises Other Exercises: L welbow ROM within pain toelrance -only able to move @ 30 degrees Other Exercises: hand/wrist AROM   Balance     End of Session OT - End of Session Equipment Utilized During Treatment: Other (comment) (sling) Activity Tolerance: Patient limited by pain Patient left: in chair;with call bell/phone within reach Nurse Communication: Mobility status;Precautions;Weight bearing status;Other (comment) (need for waist strap)  GO     Lonza Shimabukuro,HILLARY 09/13/2012, 5:51 PM [facility], OTR/L  [phone] 09/13/2012

## 2012-09-14 DIAGNOSIS — R7989 Other specified abnormal findings of blood chemistry: Secondary | ICD-10-CM

## 2012-09-14 LAB — BASIC METABOLIC PANEL
BUN: 43 mg/dL — ABNORMAL HIGH (ref 6–23)
CO2: 22 mEq/L (ref 19–32)
Calcium: 8 mg/dL — ABNORMAL LOW (ref 8.4–10.5)
Creatinine, Ser: 1.78 mg/dL — ABNORMAL HIGH (ref 0.50–1.10)
GFR calc non Af Amer: 24 mL/min — ABNORMAL LOW (ref >90)
Glucose, Bld: 105 mg/dL — ABNORMAL HIGH (ref 70–99)
Sodium: 140 mEq/L (ref 135–145)

## 2012-09-14 LAB — OSMOLALITY: Osmolality: 302 mOsm/kg — ABNORMAL HIGH (ref 275–300)

## 2012-09-14 LAB — CBC
HCT: 24.9 % — ABNORMAL LOW (ref 36.0–46.0)
Hemoglobin: 8.1 g/dL — ABNORMAL LOW (ref 12.0–15.0)
MCH: 31.6 pg (ref 26.0–34.0)
MCHC: 32.5 g/dL (ref 30.0–36.0)
MCV: 97.3 fL (ref 78.0–100.0)

## 2012-09-14 NOTE — Progress Notes (Addendum)
CSW provided patient with bed offers. CSW called patient's niece, no answer/no voicemail.  Corona Popovich C. Lia Vigilante MSW, [place] [phone] CSW spoke with patient's niece, she is agreeable to [facility] for rehab.  Dorlis Judice C. Takila Kronberg MSW, LCSW [phone]

## 2012-09-14 NOTE — Progress Notes (Signed)
[facility]                                                                                Patient Demographics  Beth Gutierrez, is a 79 y.o. female, DOB - [birth year], WUJ:[id number], NFA:[id number]  Admit date - 09/10/2012  Admitting Physician Clydia Llano, MD  Outpatient Primary MD for the patient is Oliver Barre, MD  LOS - 4   Chief Complaint  Patient presents with  . Fall  . Arm Injury        Assessment & Plan    Brief narrative:   79 year old female resident of independent living multiple medical problems including known CAD and hypertension. Was sent to the emergency department after falling at home. Based on history patient apparently had a mechanical fall said she denied a sensation of feeling dizzy or any true syncope. She has significant pain in the left shoulder. X-ray revealed a nondisplaced comminuted impacted humeral neck fracture. Because of her history of coronary disease cardiac isoenzymes were checked and her troponin was elevated at 5.96. EKG was nonischemic. She was seen by cardiology, she underwent echogram and left heart catheterization, she was found to have non-occlusive CAD, cardiology recommended medical management, she also has chronic systolic heart failure and she is compensated from this standpoint, clinically her presentation is suspicious for Takotsubo cardiomyopathy. He has developed some acute renal failure on chronic kidney disease which prevents the commencement of ACE/ARB.    Assessment and plan   CORONARY ARTERY DISEASE/NSTEMI (non-ST elevated myocardial infarction), with hypotension and chronic systolic CHF with EF 30%.   Echo and cardiac catheterization noted, per cardiology nonsignificant coronary artery disease,Possibility of Takotsubo cardiomyopathy, continue supportive care, in the light of acute renal failure holding ACE/ARB,  Aspirin, low-dose beta blocker as tolerated by her blood pressure along with statin  continued.    Falls/question of syncope   Likely due to #1 above with hypertension. PT OT eval, telemetry monitoring. She is clinically improving with supportive care.    Chr systolic heart failure/EF 30%  -compensated from a respiratory standpoint  -Cards feels is due to Takatsubo Syndrome therefore should be reversible  -No ACE/ARB secondary to acute renal failure, gentle IV fluids today, she appears compensated for this problem.    Leukocytosis  She is afebrile, UA chest x-ray stable, leukocytosis likely from the stress of fall and left femoral fracture.Trend is definitely improving.    HYPERTENSION  -BP Gradually improving, continue to hold antihypertensive medications including beta blocker, ACE/ARB especially in the setting of acute renal failure with hypotension.    ARF on CKD (chronic kidney disease) stage 2, GFR 60-89 ml/min, Baseline creatinine appears to be around 1.4  She did have left heart cath on 09/12/2012, also was hypotensive initially upon admission, hold ACE/ARB, hold Lasix for now, gentle IV fluids, monitor respiratory status closely. Her renal insufficiency is improving after gentle 12hrs of IV fluids and holding of diuretics.     Closed, comminuted fracture upper end of humerus    - has been seen by orthopedics, she can come out of the sling to perform gentle hand wrist and elbow range of motion activities.   *No range of motion of  the shoulder is allowed. She will follow up with [facility]. Ave Filter at [facility]opedics in 2 weeks    PT OT and social work consulted, patient lives alone.Will require SNF placement.     Anemia, unspecified  -hgb baseline around 9-10, stable we will monitor as needed.      Code Status: Full  Family Communication: none present  Disposition Plan: SNF   Procedures    Echo  - Left ventricle: Apical inferoapical and anteroapical akinesis Consistant with Taka Tsubo DCM The cavity size was moderately dilated.  Systolic function was severely reduced. The estimated ejection fraction was in the range of 25% to 30%. - Left atrium: The atrium was mildly dilated. - Atrial septum: No defect or patent foramen ovale was identified.    L.Heart cath Dr Shirlee Latch 09-12-12  AD and LCx with nonobstructive disease. RCA with 70% in-stent restenosis, not a critical lesion/good flow down RCA. I did not do an LV-gram due to CKD but the apex did not appear to move well. I query a Takotsubo cardiomyopathy in the setting of fall/humeral fracture.      Consults  Cards, Ortho   DVT Prophylaxis   Heparin   Lab Results  Component Value Date   PLT 301 09/14/2012    Medications  Scheduled Meds: . allopurinol  100 mg Oral Daily  . aspirin  81 mg Oral Daily  . atorvastatin  80 mg Oral q1800  . famotidine  20 mg Oral Daily  . heparin  5,000 Units Subcutaneous Q8H  . metoprolol tartrate  12.5 mg Oral BID  . sodium chloride  3 mL Intravenous Q12H   Continuous Infusions:  PRN Meds:.acetaminophen, HYDROcodone-acetaminophen, nitroGLYCERIN, ondansetron (ZOFRAN) IV, sodium chloride  Antibiotics     Anti-infectives   Start     Dose/Rate Route Frequency Ordered Stop   09/11/12 1700  vancomycin (VANCOCIN) IVPB 1000 mg/200 mL premix  Status:  Discontinued     1,000 mg 200 mL/hr over 60 Minutes Intravenous Every 24 hours 09/10/12 2011 09/12/12 0904   09/10/12 2200  piperacillin-tazobactam (ZOSYN) IVPB 3.375 g  Status:  Discontinued     3.375 g 12.5 mL/hr over 240 Minutes Intravenous 3 times per day 09/10/12 2011 09/12/12 0904   09/10/12 1545  piperacillin-tazobactam (ZOSYN) IVPB 3.375 g     3.375 g 12.5 mL/hr over 240 Minutes Intravenous  Once 09/10/12 1536 09/10/12 1717   09/10/12 1545  vancomycin (VANCOCIN) IVPB 1000 mg/200 mL premix     1,000 mg 200 mL/hr over 60 Minutes Intravenous  Once 09/10/12 1536 09/10/12 1805       Time Spent in minutes   35   SINGH,PRASHANT K M.D on 09/14/2012 at 9:54 AM  Between 7am  to 7pm - Pager - [phone]  After 7pm go to [email or web address] - password [email or web address]  And look for the night coverage person covering for me after hours  [facility] Office  [phone]    Subjective:   Beth Gutierrez today has, No headache, No chest pain, No abdominal pain - No Nausea, No new weakness tingling or numbness, No Cough - SOB.    Objective:   Filed Vitals:   09/13/12 1400 09/13/12 2001 09/13/12 2026 09/14/12 0525  BP: 107/66 102/38 100/48 104/52  Pulse: 86 82    Temp: 97.9 F (36.6 C) 98 F (36.7 C)  98 F (36.7 C)  TempSrc: Oral Oral  Oral  Resp: 18 18  18   Height:      Weight:  SpO2: 91% 96%  95%    Wt Readings from Last 3 Encounters:  09/12/12 93.1 kg (205 lb 4 oz)  09/12/12 93.1 kg (205 lb 4 oz)  06/12/12 90.323 kg (199 lb 2 oz)     Intake/Output Summary (Last 24 hours) at 09/14/12 0954 Last data filed at 09/14/12 0600  Gross per 24 hour  Intake    240 ml  Output    850 ml  Net   -610 ml    Exam Awake Alert, Oriented X 3, No new F.N deficits, Normal affect [place].AT,PERRAL Supple Neck,No JVD, No cervical lymphadenopathy appriciated.  Symmetrical Chest wall movement, Good air movement bilaterally, CTAB RRR,No Gallops,Rubs or new Murmurs, No Parasternal Heave +ve B.Sounds, Abd Soft, Non tender, No organomegaly appriciated, No rebound - guarding or rigidity. No Cyanosis, Clubbing or edema, No new Rash or bruise, L arm in immobilizer splint   Data Review   Micro Results Recent Results (from the past 240 hour(s))  CULTURE, BLOOD (ROUTINE X 2)     Status: None   Collection Time    09/10/12  4:00 PM      Result Value Range Status   Specimen Description BLOOD R RADIAL ARTERY   Final   Special Requests BOTTLES DRAWN AEROBIC AND ANAEROBIC 10 CC   Final   Culture  Setup Time 09/10/2012 23:52   Final   Culture     Final   Value:        BLOOD CULTURE RECEIVED NO GROWTH TO DATE CULTURE WILL BE HELD FOR 5 DAYS BEFORE ISSUING A FINAL  NEGATIVE REPORT   Report Status PENDING   Incomplete  MRSA PCR SCREENING     Status: None   Collection Time    09/10/12  6:54 PM      Result Value Range Status   MRSA by PCR NEGATIVE  NEGATIVE Final   Comment:            The GeneXpert MRSA Assay (FDA     approved for NASAL specimens     only), is one component of a     comprehensive MRSA colonization     surveillance program. It is not     intended to diagnose MRSA     infection nor to guide or     monitor treatment for     MRSA infections.    Radiology Reports Dg Chest 1 View  09/10/2012   *RADIOLOGY REPORT*  Clinical Data: Unwitnessed fall last night.  Left humeral fracture.  CHEST - 1 VIEW  Comparison: 06/03/2012.  Findings: There are lower lung volumes and more lordotic positioning currently.  Allowing for this, the heart size and mediastinal contours are stable.  There is increased linear atelectasis at the right lung base.  The left lung appears clear. Mildly comminuted fracture of the left humeral neck is noted.  No other acute osseous findings are identified.  Glenohumeral degenerative changes are present bilaterally.  IMPRESSION:  Low lung volumes with resulting right basilar atelectasis.  Left humeral neck fracture.   Original Report Authenticated By: Carey Bullocks, M.D.   Dg Hip Complete Right  09/10/2012   *RADIOLOGY REPORT*  Clinical Data: Larey Seat yesterday.  Right hip pain.  RIGHT HIP - COMPLETE 2+ VIEW  Comparison: None.  Findings: No evidence of acute fracture or dislocation.  Severe joint space narrowing with associated hypertrophic spurring involving the acetabulum and the femoral head.  Moderate osseous demineralization.  Included AP pelvis demonstrates a prior left total hip arthroplasty  with anatomic alignment and no complicating features.  Symphysis pubis intact.  Sacroiliac joints intact with degenerative changes. Degenerative changes involving the visualized lower lumbar spine.  IMPRESSION: No acute osseous abnormality.   Severe osteoarthritis involving the right hip.   Original Report Authenticated By: Hulan Saas, M.D.   Ct Head Wo Contrast  09/10/2012   *RADIOLOGY REPORT*  Clinical Data: Fall  CT HEAD WITHOUT CONTRAST  Technique:  Contiguous axial images were obtained from the base of the skull through the vertex without contrast.  Comparison: none  Findings: Generalized atrophy.  Chronic microvascular ischemic change in the white matter.  Negative for acute infarct.  Negative for hemorrhage mass or skull fracture.  Sinusitis with air-fluid levels in the left ethmoid and sphenoid sinuses as well as chronic mucosal thickening and bony thickening of the left ethmoid and sphenoid sinus.  IMPRESSION: Atrophy and chronic microvascular ischemic change.  No acute intracranial abnormality.  Sinusitis.   Original Report Authenticated By: Janeece Riggers, M.D.   Dg Shoulder Left  09/10/2012   *RADIOLOGY REPORT*  Clinical Data: Shoulder pain status post fall.  LEFT SHOULDER - 2+ VIEW  Comparison: None.  Findings: There is a comminuted and mildly displaced acute fracture involving the left humeral neck.  No involvement of the humeral head articular surface is identified.  There is no dislocation. There are underlying advanced glenohumeral degenerative changes. The subacromial space is preserved.  IMPRESSION: Comminuted fracture of the left humeral neck.  Severe underlying glenohumeral degenerative changes.   Original Report Authenticated By: Carey Bullocks, M.D.   Dg Humerus Left  09/10/2012   *RADIOLOGY REPORT*  Clinical Data: Fall yesterday.  Left humeral neck fracture identified on earlier shoulder imaging.  LEFT HUMERUS - 2+ VIEW  Comparison: Left shoulder x-rays obtained earlier same date.  Findings: Comminuted impacted fracture involving the humeral neck as noted on the shoulder x-rays.  No fractures elsewhere involving the humerus.  Elbow joint intact with well-preserved joint spaces. Generalized osseous demineralization.   IMPRESSION: Left humeral neck fracture as noted on the earlier shoulder imaging.  No fractures elsewhere involving the left humerus.   Original Report Authenticated By: Hulan Saas, M.D.   Dg Hand Complete Left  09/10/2012   *RADIOLOGY REPORT*  Clinical Data: Hand injury status post fall last night.  LEFT HAND - COMPLETE 3+ VIEW  Comparison: None.  Findings: The bones appear mildly demineralized.  There is no evidence of acute fracture or dislocation.  There are moderately advanced degenerative changes at the first carpal metacarpal articulation. Mild degenerative changes are noted throughout the metacarpal phalangeal joints.  No focal soft tissue swelling is evident.  IMPRESSION: No acute osseous findings.  Degenerative changes as described.   Original Report Authenticated By: Carey Bullocks, M.D.    [facility]  Recent Labs Lab 09/10/12 1346  09/11/12 1335 09/11/12 1554 09/12/12 0755 09/13/12 0515 09/14/12 0350  WBC 19.7*  < > 13.6* 16.2* 18.9* 13.4* 10.2  HGB 10.3*  < > 8.7* 9.2* 9.1* 8.4* 8.1*  HCT 32.0*  < > 26.6* 28.6* 28.3* 25.6* 24.9*  PLT 297  < > 280 294 280 263 301  MCV 100.0  < > 97.4 97.9 98.3 97.7 97.3  MCH 32.2  < > 31.9 31.5 31.6 32.1 31.6  MCHC 32.2  < > 32.7 32.2 32.2 32.8 32.5  RDW 15.7*  < > 16.6* 16.6* 16.3* 16.5* 16.4*  LYMPHSABS 1.0  --   --   --   --   --   --  MONOABS 0.8  --   --   --   --   --   --   EOSABS 0.0  --   --   --   --   --   --   BASOSABS 0.0  --   --   --   --   --   --   < > = values in this interval not displayed.  Chemistries   Recent Labs Lab 09/10/12 1346 09/10/12 2056 09/11/12 1554 09/12/12 0755 09/13/12 0515 09/14/12 0350  NA 145 141  --  141 142 140  K 3.9 5.2*  --  4.6 4.4 4.0  CL 107 108  --  107 108 108  CO2 22 20  --  20 21 22   GLUCOSE 118* 118*  --  109* 92 105*  BUN 20 22  --  28* 35* 43*  CREATININE 1.40* 1.40* 1.64* 1.85* 2.03* 1.78*  CALCIUM 9.8 8.8  --  8.3* 8.3* 8.0*  MG  --  1.9  --   --   --   --   AST 21 31   --   --   --   --   ALT 6 7  --   --   --   --   ALKPHOS 92 79  --   --   --   --   BILITOT 0.2* 0.3  --   --   --   --    ------------------------------------------------------------------------------------------------------------------ estimated creatinine clearance is 22.8 ml/min (by C-G formula based on Cr of 1.78). ------------------------------------------------------------------------------------------------------------------ No results found for this basename: HGBA1C,  in the last 72 hours ------------------------------------------------------------------------------------------------------------------ No results found for this basename: CHOL, HDL, LDLCALC, TRIG, CHOLHDL, LDLDIRECT,  in the last 72 hours ------------------------------------------------------------------------------------------------------------------ No results found for this basename: TSH, T4TOTAL, FREET3, T3FREE, THYROIDAB,  in the last 72 hours ------------------------------------------------------------------------------------------------------------------  Recent Labs  09/13/12 0935  VITAMINB12 265  FOLATE 6.5  FERRITIN 154  TIBC 220*  IRON 15*  RETICCTPCT 2.6    Coagulation profile  Recent Labs Lab 09/10/12 2056  INR 1.08    No results found for this basename: DDIMER,  in the last 72 hours  Cardiac Enzymes  Recent Labs Lab 09/10/12 2055 09/11/12 0255 09/11/12 0820  TROPONINI 6.10* 7.09* 5.16*   ------------------------------------------------------------------------------------------------------------------ No components found with this basename: POCBNP,

## 2012-09-14 NOTE — Progress Notes (Addendum)
Patient ID: Beth Gutierrez, female   DOB: 23-Feb-1921, 79 y.o.   MRN: [id number]    Subjective:  No chest pain or dyspnea; LUE pain  Objective:  Filed Vitals:   09/13/12 1400 09/13/12 2001 09/13/12 2026 09/14/12 0525  BP: 107/66 102/38 100/48 104/52  Pulse: 86 82    Temp: 97.9 F (36.6 C) 98 F (36.7 C)  98 F (36.7 C)  TempSrc: Oral Oral  Oral  Resp: 18 18  18   Height:      Weight:      SpO2: 91% 96%  95%    Intake/Output from previous day:  Intake/Output Summary (Last 24 hours) at 09/14/12 1101 Last data filed at 09/14/12 0600  Gross per 24 hour  Intake    240 ml  Output    850 ml  Net   -610 ml    Physical Exam: Affect appropriate Obese female HEENT: normal Neck supple  Lungs CT  Heart:  S1/S2 RRR abdomen: benighn, BS positve, no tenderness No edema Neuro non-focal Skin warm and dry Left humeral neck fracture    Lab Results: Basic Metabolic Panel:  Recent Labs  40/98/11 0515 09/14/12 0350  NA 142 140  K 4.4 4.0  CL 108 108  CO2 21 22  GLUCOSE 92 105*  BUN 35* 43*  CREATININE 2.03* 1.78*  CALCIUM 8.3* 8.0*   CBC:  Recent Labs  09/13/12 0515 09/14/12 0350  WBC 13.4* 10.2  HGB 8.4* 8.1*  HCT 25.6* 24.9*  MCV 97.7 97.3  PLT 263 301       Telemetry:  NSR no VT  Echo: EF 25-30 % consistent with Taka Tsubo DCM  Medications:   . allopurinol  100 mg Oral Daily  . aspirin  81 mg Oral Daily  . atorvastatin  80 mg Oral q1800  . famotidine  20 mg Oral Daily  . heparin  5,000 Units Subcutaneous Q8H  . metoprolol tartrate  12.5 mg Oral BID  . sodium chloride  3 mL Intravenous Q12H        Assessment/Plan:  CAD/elevated troponin:  Cath with no acute occlusion. Continue aspirin and statin.  Takotsubo cardiomyopathy: Echo and cath suggest takotsubo CM; continue beta blocker. No ACE inhibitor given renal insufficiency. Followup echocardiogram in approximately 8 weeks to see if LV function improves.  Chol:  Continue  statin  Ortho:  Left humeral fracture No surgery planned    Olga Millers 09/14/2012, 11:01 AM

## 2012-09-15 LAB — BASIC METABOLIC PANEL
BUN: 36 mg/dL — ABNORMAL HIGH (ref 6–23)
Calcium: 8.2 mg/dL — ABNORMAL LOW (ref 8.4–10.5)
Creatinine, Ser: 1.54 mg/dL — ABNORMAL HIGH (ref 0.50–1.10)
GFR calc Af Amer: 33 mL/min — ABNORMAL LOW (ref >90)
GFR calc non Af Amer: 29 mL/min — ABNORMAL LOW (ref >90)
Glucose, Bld: 109 mg/dL — ABNORMAL HIGH (ref 70–99)

## 2012-09-15 NOTE — Progress Notes (Signed)
[facility]                                                                                Patient Demographics  Beth Gutierrez, is a 79 y.o. female, DOB - Apr 27, 1921, ZOX:[id number], WJX:[id number]  Admit date - 09/10/2012  Admitting Physician Clydia Llano, MD  Outpatient Primary MD for the patient is Oliver Barre, MD  LOS - 5   Chief Complaint  Patient presents with  . Fall  . Arm Injury        Assessment & Plan    Brief narrative:   79 year old female resident of independent living multiple medical problems including known CAD and hypertension. Was sent to the emergency department after falling at home. Based on history patient apparently had a mechanical fall said she denied a sensation of feeling dizzy or any true syncope. She has significant pain in the left shoulder. X-ray revealed a nondisplaced comminuted impacted humeral neck fracture. Because of her history of coronary disease cardiac isoenzymes were checked and her troponin was elevated at 5.96. EKG was nonischemic. She was seen by cardiology, she underwent echogram and left heart catheterization, she was found to have non-occlusive CAD, cardiology recommended medical management, she also has chronic systolic heart failure and she is compensated from this standpoint, clinically her presentation is suspicious for Takotsubo cardiomyopathy. He has developed some acute renal failure on chronic kidney disease which prevents the commencement of ACE/ARB.    Assessment and plan   CORONARY ARTERY DISEASE/NSTEMI (non-ST elevated myocardial infarction), with hypotension and chronic systolic CHF with EF 30%.   Echo and cardiac catheterization noted, per cardiology nonsignificant coronary artery disease,Possibility of Takotsubo cardiomyopathy, continue supportive care, in the light of acute renal failure holding ACE/ARB,  Aspirin, low-dose beta blocker as tolerated by her blood pressure along with statin  continued.    Falls/question of syncope   Likely due to #1 above with hypertension. PT OT eval, telemetry monitoring. She is clinically improving with supportive care.    Chr systolic heart failure/EF 30%  -compensated from a respiratory standpoint  -Cards feels is due to Takatsubo Syndrome therefore should be reversible  -No ACE/ARB secondary to acute renal failure, gentle IV fluids today, she appears compensated for this problem.    Leukocytosis  She is afebrile, UA chest x-ray stable, leukocytosis likely from the stress of fall and left femoral fracture.Trend is definitely improving.    HYPERTENSION  -BP Gradually improving, continue to hold antihypertensive medications including beta blocker, ACE/ARB especially in the setting of acute renal failure with hypotension.    ARF on CKD (chronic kidney disease) stage 2, GFR 60-89 ml/min, Baseline creatinine appears to be around 1.4  She did have left heart cath on 09/12/2012, also was hypotensive initially upon admission, hold ACE/ARB, hold Lasix for now, gentle IV fluids, monitor respiratory status closely. Her renal insufficiency continues to improve after gentle 12hrs of IV fluids and holding of diuretics.     Closed, comminuted fracture upper end of humerus    - has been seen by orthopedics, she can come out of the sling to perform gentle hand wrist and elbow range of motion activities.   *No range of motion  of the shoulder is allowed. She will follow up with Dr. Ave Filter at [facility] orthopedics in 2 weeks    PT OT and social work consulted, patient lives alone.Will require SNF placement.     Anemia, unspecified  -hgb baseline around 9-10, stable we will monitor as needed.      Code Status: Full  Family Communication: none present  Disposition Plan: SNF await placement   Procedures    Echo  - Left ventricle: Apical inferoapical and anteroapical akinesis Consistant with Taka Tsubo DCM The cavity size was  moderately dilated. Systolic function was severely reduced. The estimated ejection fraction was in the range of 25% to 30%. - Left atrium: The atrium was mildly dilated. - Atrial septum: No defect or patent foramen ovale was identified.    L.Heart cath Dr Shirlee Latch 09-12-12  AD and LCx with nonobstructive disease. RCA with 70% in-stent restenosis, not a critical lesion/good flow down RCA. I did not do an LV-gram due to CKD but the apex did not appear to move well. I query a Takotsubo cardiomyopathy in the setting of fall/humeral fracture.      Consults  Cards, Ortho   DVT Prophylaxis   Heparin   Lab Results  Component Value Date   PLT 301 09/14/2012    Medications  Scheduled Meds: . allopurinol  100 mg Oral Daily  . aspirin  81 mg Oral Daily  . atorvastatin  80 mg Oral q1800  . famotidine  20 mg Oral Daily  . heparin  5,000 Units Subcutaneous Q8H  . metoprolol tartrate  12.5 mg Oral BID  . sodium chloride  3 mL Intravenous Q12H   Continuous Infusions:  PRN Meds:.acetaminophen, HYDROcodone-acetaminophen, nitroGLYCERIN, ondansetron (ZOFRAN) IV, sodium chloride  Antibiotics     Anti-infectives   Start     Dose/Rate Route Frequency Ordered Stop   09/11/12 1700  vancomycin (VANCOCIN) IVPB 1000 mg/200 mL premix  Status:  Discontinued     1,000 mg 200 mL/hr over 60 Minutes Intravenous Every 24 hours 09/10/12 2011 09/12/12 0904   09/10/12 2200  piperacillin-tazobactam (ZOSYN) IVPB 3.375 g  Status:  Discontinued     3.375 g 12.5 mL/hr over 240 Minutes Intravenous 3 times per day 09/10/12 2011 09/12/12 0904   09/10/12 1545  piperacillin-tazobactam (ZOSYN) IVPB 3.375 g     3.375 g 12.5 mL/hr over 240 Minutes Intravenous  Once 09/10/12 1536 09/10/12 1717   09/10/12 1545  vancomycin (VANCOCIN) IVPB 1000 mg/200 mL premix     1,000 mg 200 mL/hr over 60 Minutes Intravenous  Once 09/10/12 1536 09/10/12 1805       Time Spent in minutes   35   Eloise Mula K M.D on 09/15/2012 at  7:32 AM  Between 7am to 7pm - Pager - [phone]  After 7pm go to [email or web address] - password [email or web address]  And look for the night coverage person covering for me after hours  [facility] Office  [phone]    Subjective:   [name] today has, No headache, No chest pain, No abdominal pain - No Nausea, No new weakness tingling or numbness, No Cough - SOB.    Objective:   Filed Vitals:   09/14/12 1355 09/14/12 2024 09/15/12 0335 09/15/12 0513  BP: 98/58 125/66 152/77 123/69  Pulse: 85 93 105 84  Temp: 98 F (36.7 C) 98.2 F (36.8 C) 98.2 F (36.8 C) 98.1 F (36.7 C)  TempSrc: Oral Oral Oral Oral  Resp: 16 17 17 18   Height:  Weight:   90.357 kg (199 lb 3.2 oz) 90.357 kg (199 lb 3.2 oz)  SpO2: 99% 96% 93% 98%    Wt Readings from Last 3 Encounters:  09/15/12 90.357 kg (199 lb 3.2 oz)  09/15/12 90.357 kg (199 lb 3.2 oz)  06/12/12 90.323 kg (199 lb 2 oz)     Intake/Output Summary (Last 24 hours) at 09/15/12 0732 Last data filed at 09/15/12 0339  Gross per 24 hour  Intake    480 ml  Output    351 ml  Net    129 ml    Exam Awake Alert, Oriented X 3, No new F.N deficits, Normal affect [place].AT,PERRAL Supple Neck,No JVD, No cervical lymphadenopathy appriciated.  Symmetrical Chest wall movement, Good air movement bilaterally, CTAB RRR,No Gallops,Rubs or new Murmurs, No Parasternal Heave +ve B.Sounds, Abd Soft, Non tender, No organomegaly appriciated, No rebound - guarding or rigidity. No Cyanosis, Clubbing or edema, No new Rash or bruise, L arm in immobilizer splint   Data Review   Micro Results Recent Results (from the past 240 hour(s))  CULTURE, BLOOD (ROUTINE X 2)     Status: None   Collection Time    09/10/12  4:00 PM      Result Value Range Status   Specimen Description BLOOD R RADIAL ARTERY   Final   Special Requests BOTTLES DRAWN AEROBIC AND ANAEROBIC 10 CC   Final   Culture  Setup Time 09/10/2012 23:52   Final   Culture     Final    Value:        BLOOD CULTURE RECEIVED NO GROWTH TO DATE CULTURE WILL BE HELD FOR 5 DAYS BEFORE ISSUING A FINAL NEGATIVE REPORT   Report Status PENDING   Incomplete  MRSA PCR SCREENING     Status: None   Collection Time    09/10/12  6:54 PM      Result Value Range Status   MRSA by PCR NEGATIVE  NEGATIVE Final   Comment:            The GeneXpert MRSA Assay (FDA     approved for NASAL specimens     only), is one component of a     comprehensive MRSA colonization     surveillance program. It is not     intended to diagnose MRSA     infection nor to guide or     monitor treatment for     MRSA infections.    Radiology Reports Dg Chest 1 View  09/10/2012   *RADIOLOGY REPORT*  Clinical Data: Unwitnessed fall last night.  Left humeral fracture.  CHEST - 1 VIEW  Comparison: 06/03/2012.  Findings: There are lower lung volumes and more lordotic positioning currently.  Allowing for this, the heart size and mediastinal contours are stable.  There is increased linear atelectasis at the right lung base.  The left lung appears clear. Mildly comminuted fracture of the left humeral neck is noted.  No other acute osseous findings are identified.  Glenohumeral degenerative changes are present bilaterally.  IMPRESSION:  Low lung volumes with resulting right basilar atelectasis.  Left humeral neck fracture.   Original Report Authenticated By: Carey Bullocks, M.D.   Dg Hip Complete Right  09/10/2012   *RADIOLOGY REPORT*  Clinical Data: Larey Seat yesterday.  Right hip pain.  RIGHT HIP - COMPLETE 2+ VIEW  Comparison: None.  Findings: No evidence of acute fracture or dislocation.  Severe joint space narrowing with associated hypertrophic spurring involving the acetabulum and the  femoral head.  Moderate osseous demineralization.  Included AP pelvis demonstrates a prior left total hip arthroplasty with anatomic alignment and no complicating features.  Symphysis pubis intact.  Sacroiliac joints intact with degenerative changes.  Degenerative changes involving the visualized lower lumbar spine.  IMPRESSION: No acute osseous abnormality.  Severe osteoarthritis involving the right hip.   Original Report Authenticated By: Hulan Saas, M.D.   Ct Head Wo Contrast  09/10/2012   *RADIOLOGY REPORT*  Clinical Data: Fall  CT HEAD WITHOUT CONTRAST  Technique:  Contiguous axial images were obtained from the base of the skull through the vertex without contrast.  Comparison: none  Findings: Generalized atrophy.  Chronic microvascular ischemic change in the white matter.  Negative for acute infarct.  Negative for hemorrhage mass or skull fracture.  Sinusitis with air-fluid levels in the left ethmoid and sphenoid sinuses as well as chronic mucosal thickening and bony thickening of the left ethmoid and sphenoid sinus.  IMPRESSION: Atrophy and chronic microvascular ischemic change.  No acute intracranial abnormality.  Sinusitis.   Original Report Authenticated By: Janeece Riggers, M.D.   Dg Shoulder Left  09/10/2012   *RADIOLOGY REPORT*  Clinical Data: Shoulder pain status post fall.  LEFT SHOULDER - 2+ VIEW  Comparison: None.  Findings: There is a comminuted and mildly displaced acute fracture involving the left humeral neck.  No involvement of the humeral head articular surface is identified.  There is no dislocation. There are underlying advanced glenohumeral degenerative changes. The subacromial space is preserved.  IMPRESSION: Comminuted fracture of the left humeral neck.  Severe underlying glenohumeral degenerative changes.   Original Report Authenticated By: Carey Bullocks, M.D.   Dg Humerus Left  09/10/2012   *RADIOLOGY REPORT*  Clinical Data: Fall yesterday.  Left humeral neck fracture identified on earlier shoulder imaging.  LEFT HUMERUS - 2+ VIEW  Comparison: Left shoulder x-rays obtained earlier same date.  Findings: Comminuted impacted fracture involving the humeral neck as noted on the shoulder x-rays.  No fractures elsewhere involving  the humerus.  Elbow joint intact with well-preserved joint spaces. Generalized osseous demineralization.  IMPRESSION: Left humeral neck fracture as noted on the earlier shoulder imaging.  No fractures elsewhere involving the left humerus.   Original Report Authenticated By: Hulan Saas, M.D.   Dg Hand Complete Left  09/10/2012   *RADIOLOGY REPORT*  Clinical Data: Hand injury status post fall last night.  LEFT HAND - COMPLETE 3+ VIEW  Comparison: None.  Findings: The bones appear mildly demineralized.  There is no evidence of acute fracture or dislocation.  There are moderately advanced degenerative changes at the first carpal metacarpal articulation. Mild degenerative changes are noted throughout the metacarpal phalangeal joints.  No focal soft tissue swelling is evident.  IMPRESSION: No acute osseous findings.  Degenerative changes as described.   Original Report Authenticated By: Carey Bullocks, M.D.    [facility]  Recent Labs Lab 09/10/12 1346  09/11/12 1335 09/11/12 1554 09/12/12 0755 09/13/12 0515 09/14/12 0350  WBC 19.7*  < > 13.6* 16.2* 18.9* 13.4* 10.2  HGB 10.3*  < > 8.7* 9.2* 9.1* 8.4* 8.1*  HCT 32.0*  < > 26.6* 28.6* 28.3* 25.6* 24.9*  PLT 297  < > 280 294 280 263 301  MCV 100.0  < > 97.4 97.9 98.3 97.7 97.3  MCH 32.2  < > 31.9 31.5 31.6 32.1 31.6  MCHC 32.2  < > 32.7 32.2 32.2 32.8 32.5  RDW 15.7*  < > 16.6* 16.6* 16.3* 16.5* 16.4*  LYMPHSABS 1.0  --   --   --   --   --   --  MONOABS 0.8  --   --   --   --   --   --   EOSABS 0.0  --   --   --   --   --   --   BASOSABS 0.0  --   --   --   --   --   --   < > = values in this interval not displayed.  Chemistries   Recent Labs Lab 09/10/12 1346 09/10/12 2056 09/11/12 1554 09/12/12 0755 09/13/12 0515 09/14/12 0350 09/15/12 0446  NA 145 141  --  141 142 140 140  K 3.9 5.2*  --  4.6 4.4 4.0 3.9  CL 107 108  --  107 108 108 109  CO2 22 20  --  20 21 22 21   GLUCOSE 118* 118*  --  109* 92 105* 109*  BUN 20 22  --  28*  35* 43* 36*  CREATININE 1.40* 1.40* 1.64* 1.85* 2.03* 1.78* 1.54*  CALCIUM 9.8 8.8  --  8.3* 8.3* 8.0* 8.2*  MG  --  1.9  --   --   --   --   --   AST 21 31  --   --   --   --   --   ALT 6 7  --   --   --   --   --   ALKPHOS 92 79  --   --   --   --   --   BILITOT 0.2* 0.3  --   --   --   --   --    ------------------------------------------------------------------------------------------------------------------ estimated creatinine clearance is 25.9 ml/min (by C-G formula based on Cr of 1.54). ------------------------------------------------------------------------------------------------------------------ No results found for this basename: HGBA1C,  in the last 72 hours ------------------------------------------------------------------------------------------------------------------ No results found for this basename: CHOL, HDL, LDLCALC, TRIG, CHOLHDL, LDLDIRECT,  in the last 72 hours ------------------------------------------------------------------------------------------------------------------ No results found for this basename: TSH, T4TOTAL, FREET3, T3FREE, THYROIDAB,  in the last 72 hours ------------------------------------------------------------------------------------------------------------------  Recent Labs  09/13/12 0935  VITAMINB12 265  FOLATE 6.5  FERRITIN 154  TIBC 220*  IRON 15*  RETICCTPCT 2.6    Coagulation profile  Recent Labs Lab 09/10/12 2056  INR 1.08    No results found for this basename: DDIMER,  in the last 72 hours  Cardiac Enzymes  Recent Labs Lab 09/10/12 2055 09/11/12 0255 09/11/12 0820  TROPONINI 6.10* 7.09* 5.16*   ------------------------------------------------------------------------------------------------------------------ No components found with this basename: POCBNP,

## 2012-09-16 LAB — BASIC METABOLIC PANEL
BUN: 31 mg/dL — ABNORMAL HIGH (ref 6–23)
Calcium: 8.2 mg/dL — ABNORMAL LOW (ref 8.4–10.5)
Creatinine, Ser: 1.54 mg/dL — ABNORMAL HIGH (ref 0.50–1.10)
GFR calc Af Amer: 33 mL/min — ABNORMAL LOW (ref >90)

## 2012-09-16 LAB — CULTURE, BLOOD (ROUTINE X 2): Culture: NO GROWTH

## 2012-09-16 MED ORDER — METOPROLOL TARTRATE 12.5 MG HALF TABLET: 12.5000 mg | ORAL_TABLET | Freq: Two times a day (BID) | ORAL | Status: DC

## 2012-09-16 MED ORDER — ATORVASTATIN CALCIUM 80 MG PO TABS: 80.0000 mg | ORAL_TABLET | Freq: Every day | ORAL | Status: DC

## 2012-09-16 NOTE — Discharge Summary (Signed)
[facility]                                                                                   Beth Gutierrez, is a 79 y.o. female  DOB [birth year]  MRN [id number].  Admission date:  09/10/2012  Discharge Date:  09/16/2012  Primary MD  Oliver Barre, MD  Admitting Physician  Clydia Llano, MD  Admission Diagnosis  Elevated troponin [790.6] SIRS (systemic inflammatory response syndrome) [995.90] Hypotension [458.9] Fall, initial encounter [E888.9]  Discharge Diagnosis     Active Problems:   HYPERTENSION   CORONARY ARTERY DISEASE   CKD (chronic kidney disease) stage 2, GFR 60-89 ml/min   Anemia, unspecified   Hypotension   Closed fracture of unspecified part of upper end of humerus   NSTEMI (non-ST elevated myocardial infarction)      Past Medical History  Diagnosis Date  . Acute myocardial infarction, unspecified site, episode of care unspecified   . Congestive heart failure, unspecified   . Coronary atherosclerosis of unspecified type of vessel, native or graft   . Unspecified essential hypertension   . Other and unspecified hyperlipidemia   . Esophageal reflux   . Depressive disorder, not elsewhere classified   . Morbid obesity   . Need for prophylactic vaccination and inoculation against influenza   . Allergic rhinitis, cause unspecified   . Other malaise and fatigue   . Disorder of bone and cartilage, unspecified   . Diverticulosis of colon (without mention of hemorrhage)   . Benign neoplasm of colon   . Gout, unspecified   . Unspecified glaucoma(365.9)   . Osteoarthrosis, unspecified whether generalized or localized, unspecified site   . Gout flare 02/06/2011  . Anemia, unspecified 04/05/2011    Past Surgical History  Procedure Laterality Date  . Coronary angioplasty with stent placement      RCA stent  . Vesicovaginal fistula closure w/ tah    . Left hip replacement      s/p 2002. Secondary to DJD  . Ectopic pregnancy surgery        Recommendations for primary care physician for things to follow:   Must follow with cardiology and orthopedics closely. Once renal function back to baseline consider ACE/ARB.   Discharge Diagnoses:   Active Problems:   HYPERTENSION   CORONARY ARTERY DISEASE   CKD (chronic kidney disease) stage 2, GFR 60-89 ml/min   Anemia, unspecified   Hypotension   Closed fracture of unspecified part of upper end of humerus   NSTEMI (non-ST elevated myocardial infarction)    Discharge Condition: Stable   Diet recommendation: See Discharge Instructions below   Consults orthopedics, cardiology    History of present illness and  Hospital Course:     Kindly see H&P for history of present illness and admission details, please review complete Labs, Consult reports and Test reports for all details in brief Beth Gutierrez, is a 79 y.o. female, resident of independent living multiple medical problems including known CAD and hypertension. Was sent to the emergency department after falling at home. Based on history patient apparently had a mechanical fall said she denied a sensation of feeling dizzy or any true syncope.  She has significant pain in the left shoulder. X-ray revealed a nondisplaced comminuted impacted humeral neck fracture. Because of her history of coronary disease cardiac isoenzymes were checked and her troponin was elevated at 5.96. EKG was nonischemic. She was seen by cardiology, she underwent echogram and left heart catheterization, she was found to have non-occlusive CAD, cardiology recommended medical management, she also has chronic systolic heart failure and she is compensated from this standpoint, clinically her presentation is suspicious for Takotsubo cardiomyopathy. She has developed some acute renal failure on chronic kidney disease which prevents the commencement of ACE/ARB.        1. Non-STEMI with hypertension and chronic systolic CHF with EF of 30% - cardiac  isoenzymes were checked and her troponin was elevated at 5.96. EKG was nonischemic. She was seen by cardiology, she underwent echogram and left heart catheterization, she was found to have non-occlusive CAD, cardiology recommended medical management, she also has chronic systolic heart failure and she is compensated from this standpoint, clinically her presentation is suspicious for Takotsubo cardiomyopathy. She has developed some acute renal failure on chronic kidney disease which prevents the commencement of ACE/ARB, be placed on aspirin and low-dose beta blocker, she will follow with cardiology closely post discharge, once renal function is close to baseline low dose ACE/ARB can be considered.   Echo  - Left ventricle: Apical inferoapical and anteroapical akinesis Consistant with Taka Tsubo DCM The cavity size was moderately dilated. Systolic function was severely reduced. The estimated ejection fraction was in the range of 25% to 30%. - Left atrium: The atrium was mildly dilated. - Atrial septum: No defect or patent foramen ovale was identified.   L.Heart cath Dr Shirlee Latch 09-12-12  AD and LCx with nonobstructive disease. RCA with 70% in-stent restenosis, not a critical lesion/good flow down RCA. I did not do an LV-gram due to CKD but the apex did not appear to move well. I query a Takotsubo cardiomyopathy in the setting of fall/humeral fracture.       2. Reactionary leukocytosis which has now resolved, she remained afebrile with stable chest x-ray and a UA.    3. ARF on chronic kidney disease stage 2-3  -  Baseline creatinine is 1.4.- After gentle IV fluids and holding of Lasix her renal function is coming close to baseline, once renal function is stable low-dose ACE/ARB can be continued. If she is symptomatic from heart failure standpoint low-dose diuretic as needed can be used.    4. History of fall. Likely due to #1 above. She will require PT, fall precautions, activity with  assistance.    5. Anemia of chronic disease stable.    6.Closed, comminuted fracture upper end of humerus  - has been seen by orthopedics, she can come out of the sling to perform gentle hand wrist and elbow range of motion activities.   No range of motion of the shoulder is allowed. She will follow up with [facility]. Ave Filter at [facility] in 2 weeks        Today   Subjective:   Eloyse Causey today has no headache,no chest abdominal pain,no new weakness tingling or numbness, feels much better.  Objective:   Blood pressure 106/53, pulse 73, temperature 98.1 F (36.7 C), temperature source Oral, resp. rate 18, height 5\' 2"  (1.575 m), weight 90.357 kg (199 lb 3.2 oz), SpO2 98.00%.   Intake/Output Summary (Last 24 hours) at 09/16/12 0903 Last data filed at 09/15/12 2100  Gross per 24 hour  Intake  240 ml  Output      0 ml  Net    240 ml    Exam Awake Alert, Oriented *3, No new F.N deficits, Normal affect [place].AT,PERRAL Supple Neck,No JVD, No cervical lymphadenopathy appriciated.  Symmetrical Chest wall movement, Good air movement bilaterally, CTAB RRR,No Gallops,Rubs or new Murmurs, No Parasternal Heave +ve B.Sounds, Abd Soft, Non tender, No organomegaly appriciated, No rebound -guarding or rigidity. No Cyanosis, Clubbing or edema, No new Rash or bruise, left arm in sling.  Data Review   Major procedures and Radiology Reports - PLEASE review detailed and final reports for all details, in brief -       Dg Chest 1 View  09/10/2012   *RADIOLOGY REPORT*  Clinical Data: Unwitnessed fall last night.  Left humeral fracture.  CHEST - 1 VIEW  Comparison: 06/03/2012.  Findings: There are lower lung volumes and more lordotic positioning currently.  Allowing for this, the heart size and mediastinal contours are stable.  There is increased linear atelectasis at the right lung base.  The left lung appears clear. Mildly comminuted fracture of the left humeral neck is  noted.  No other acute osseous findings are identified.  Glenohumeral degenerative changes are present bilaterally.  IMPRESSION:  Low lung volumes with resulting right basilar atelectasis.  Left humeral neck fracture.   Original Report Authenticated By: Carey Bullocks, M.D.   Dg Hip Complete Right  09/10/2012   *RADIOLOGY REPORT*  Clinical Data: Larey Seat yesterday.  Right hip pain.  RIGHT HIP - COMPLETE 2+ VIEW  Comparison: None.  Findings: No evidence of acute fracture or dislocation.  Severe joint space narrowing with associated hypertrophic spurring involving the acetabulum and the femoral head.  Moderate osseous demineralization.  Included AP pelvis demonstrates a prior left total hip arthroplasty with anatomic alignment and no complicating features.  Symphysis pubis intact.  Sacroiliac joints intact with degenerative changes. Degenerative changes involving the visualized lower lumbar spine.  IMPRESSION: No acute osseous abnormality.  Severe osteoarthritis involving the right hip.   Original Report Authenticated By: Hulan Saas, M.D.   Ct Head Wo Contrast  09/10/2012   *RADIOLOGY REPORT*  Clinical Data: Fall  CT HEAD WITHOUT CONTRAST  Technique:  Contiguous axial images were obtained from the base of the skull through the vertex without contrast.  Comparison: none  Findings: Generalized atrophy.  Chronic microvascular ischemic change in the white matter.  Negative for acute infarct.  Negative for hemorrhage mass or skull fracture.  Sinusitis with air-fluid levels in the left ethmoid and sphenoid sinuses as well as chronic mucosal thickening and bony thickening of the left ethmoid and sphenoid sinus.  IMPRESSION: Atrophy and chronic microvascular ischemic change.  No acute intracranial abnormality.  Sinusitis.   Original Report Authenticated By: Janeece Riggers, M.D.   Dg Shoulder Left  09/10/2012   *RADIOLOGY REPORT*  Clinical Data: Shoulder pain status post fall.  LEFT SHOULDER - 2+ VIEW  Comparison: None.   Findings: There is a comminuted and mildly displaced acute fracture involving the left humeral neck.  No involvement of the humeral head articular surface is identified.  There is no dislocation. There are underlying advanced glenohumeral degenerative changes. The subacromial space is preserved.  IMPRESSION: Comminuted fracture of the left humeral neck.  Severe underlying glenohumeral degenerative changes.   Original Report Authenticated By: Carey Bullocks, M.D.   Dg Humerus Left  09/10/2012   *RADIOLOGY REPORT*  Clinical Data: Fall yesterday.  Left humeral neck fracture identified on earlier shoulder imaging.  LEFT HUMERUS - 2+ VIEW  Comparison: Left shoulder x-rays obtained earlier same date.  Findings: Comminuted impacted fracture involving the humeral neck as noted on the shoulder x-rays.  No fractures elsewhere involving the humerus.  Elbow joint intact with well-preserved joint spaces. Generalized osseous demineralization.  IMPRESSION: Left humeral neck fracture as noted on the earlier shoulder imaging.  No fractures elsewhere involving the left humerus.   Original Report Authenticated By: Hulan Saas, M.D.   Dg Hand Complete Left  09/10/2012   *RADIOLOGY REPORT*  Clinical Data: Hand injury status post fall last night.  LEFT HAND - COMPLETE 3+ VIEW  Comparison: None.  Findings: The bones appear mildly demineralized.  There is no evidence of acute fracture or dislocation.  There are moderately advanced degenerative changes at the first carpal metacarpal articulation. Mild degenerative changes are noted throughout the metacarpal phalangeal joints.  No focal soft tissue swelling is evident.  IMPRESSION: No acute osseous findings.  Degenerative changes as described.   Original Report Authenticated By: Carey Bullocks, M.D.    Micro Results      Recent Results (from the past 240 hour(s))  CULTURE, BLOOD (ROUTINE X 2)     Status: None   Collection Time    09/10/12  4:00 PM      Result Value  Range Status   Specimen Description BLOOD R RADIAL ARTERY   Final   Special Requests BOTTLES DRAWN AEROBIC AND ANAEROBIC 10 CC   Final   Culture  Setup Time 09/10/2012 23:52   Final   Culture NO GROWTH 5 DAYS   Final   Report Status 09/16/2012 FINAL   Final  MRSA PCR SCREENING     Status: None   Collection Time    09/10/12  6:54 PM      Result Value Range Status   MRSA by PCR NEGATIVE  NEGATIVE Final   Comment:            The GeneXpert MRSA Assay (FDA     approved for NASAL specimens     only), is one component of a     comprehensive MRSA colonization     surveillance program. It is not     intended to diagnose MRSA     infection nor to guide or     monitor treatment for     MRSA infections.     CBC w Diff: Lab Results  Component Value Date   WBC 10.2 09/14/2012   HGB 8.1* 09/14/2012   HCT 24.9* 09/14/2012   PLT 301 09/14/2012   LYMPHOPCT 5* 09/10/2012   BANDSPCT 4 09/10/2012   MONOPCT 4 09/10/2012   EOSPCT 0 09/10/2012   BASOPCT 0 09/10/2012    CMP: Lab Results  Component Value Date   NA 141 09/16/2012   K 4.1 09/16/2012   CL 108 09/16/2012   CO2 21 09/16/2012   BUN 31* 09/16/2012   CREATININE 1.54* 09/16/2012   PROT 6.4 09/10/2012   ALBUMIN 3.3* 09/10/2012   BILITOT 0.3 09/10/2012   ALKPHOS 79 09/10/2012   AST 31 09/10/2012   ALT 7 09/10/2012  .   Discharge Instructions     Left shoulder fracture Continue wearing the sling for the next 2 weeks, Including sleeping in the sling Can come out of the sling to perform range of motion of the elbow hand and wrist Ice the shoulder intermittently Followup with Dr. Ave Filter with [facility] in 2 weeks, Call the office at [phone] to make the appointment, or  if you have any questions.  Follow with Primary MD Oliver Barre, MD in 4 days   Get CBC, CMP, checked 4 days by Primary MD and again as instructed by your Primary MD.   Get Medicines reviewed and adjusted.  Please request your Prim.MD to go over all Hospital Tests and  Procedure/Radiological results at the follow up, please get all Hospital records sent to your Prim MD by signing hospital release before you go home.  Activity: As tolerated with Full fall precautions use walker/cane & assistance as needed   Diet: Heart Healthy,  Fluid restriction 1.8 lit/day, Aspiration precautions.  Check your Weight same time everyday, if you gain over 2 pounds, or you develop in leg swelling, experience more shortness of breath or chest pain, call your Primary MD immediately. Follow Cardiac Low Salt Diet and 1.8 lit/day fluid restriction.  Disposition SNF  If you experience worsening of your admission symptoms, develop shortness of breath, life threatening emergency, suicidal or homicidal thoughts you must seek medical attention immediately by calling 911 or calling your MD immediately  if symptoms less severe.  You Must read complete instructions/literature along with all the possible adverse reactions/side effects for all the Medicines you take and that have been prescribed to you. Take any new Medicines after you have completely understood and accpet all the possible adverse reactions/side effects.   Do not drive and provide baby sitting services if your were admitted for syncope or siezures until you have seen by Primary MD or a Neurologist and advised to do so again.  Do not drive when taking Pain medications.    Do not take more than prescribed Pain, Sleep and Anxiety Medications  Special Instructions: If you have smoked or chewed Tobacco  in the last 2 yrs please stop smoking, stop any regular Alcohol  and or any Recreational drug use.  Wear Seat belts while driving.   Please note  You were cared for by a hospitalist during your hospital stay. If you have any questions about your discharge medications or the care you received while you were in the hospital after you are discharged, you can call the unit and asked to speak with the hospitalist on call if the  hospitalist that took care of you is not available. Once you are discharged, your primary care physician will handle any further medical issues. Please note that NO REFILLS for any discharge medications will be authorized once you are discharged, as it is imperative that you return to your primary care physician (or establish a relationship with a primary care physician if you do not have one) for your aftercare needs so that they can reassess your need for medications and monitor your lab values.   Follow-up Information   Follow up with Mable Paris, MD. Schedule an appointment as soon as possible for a visit in 2 weeks.   Contact information:   [address] [place] [address] [phone]       Follow up with Oliver Barre, MD. Schedule an appointment as soon as possible for a visit in 4 days.   Contact information:   [address] [place] [address] [phone]       Follow up with MCALHANY,CHRISTOPHER, MD. Schedule an appointment as soon as possible for a visit in 1 week.   Contact information:   [address] [place] [address] [phone]         Discharge Medications  Medication List    STOP taking these medications       ramipril 10 MG capsule  Commonly known as:  ALTACE      TAKE these medications       allopurinol 100 MG tablet  Commonly known as:  ZYLOPRIM  Take 1 tablet (100 mg total) by mouth daily.     atorvastatin 80 MG tablet  Commonly known as:  LIPITOR  Take 1 tablet (80 mg total) by mouth daily at 6 PM.     docusate sodium 100 MG capsule  Commonly known as:  COLACE  Take 100 mg by mouth daily as needed for constipation.     ECOTRIN LOW STRENGTH 81 MG EC tablet  Generic drug:  aspirin  Take 81 mg by mouth daily.     metoprolol tartrate 12.5 mg Tabs  Commonly known as:  LOPRESSOR  Take 0.5 tablets (12.5 mg total) by mouth 2 (two) times daily.     ondansetron 4 MG tablet  Commonly  known as:  ZOFRAN  Take 1 tablet (4 mg total) by mouth every 8 (eight) hours as needed for nausea.     ranitidine 150 MG tablet  Commonly known as:  ZANTAC  Take 1 tablet (150 mg total) by mouth daily as needed. Acid reflux           Total Time in preparing paper work, data evaluation and todays exam - 35 minutes  Leroy Sea M.D on 09/16/2012 at 9:03 AM  [facility] Office  [phone]

## 2012-09-16 NOTE — Progress Notes (Signed)
Physical Therapy Treatment Patient Details Name: Beth Gutierrez MRN: [id number] DOB: 05-07-1921 Today's Date: 09/16/2012 Time: 1520-1530 PT Time Calculation (min): 10 min  PT Assessment / Plan / Recommendation  History of Present Illness Pt s/p NSTEMI with fall and left humeral comminuted nondisplaced fracture.   PT Comments   Limited session due to pending d/c to SNF this pm.  Demonstrates increased difficulty standing from lower surface and has increased posterior bias with initial sit to stand trial; improved with facilitation and assist for anterior weight shift.  Also assisted to don sling after helping with donning clothes.  Follow Up Recommendations  SNF           Equipment Recommendations  None recommended by PT       Frequency Min 3X/week   Progress towards PT Goals Progress towards PT goals: Progressing toward goals  Plan Current plan remains appropriate    Precautions / Restrictions Precautions Precaution Comments: no shoulder ROM allowed. sling at all times with the exception of ADL Required Braces or Orthoses: Sling Restrictions Weight Bearing Restrictions: Yes LUE Weight Bearing: Non weight bearing   Pertinent Vitals/Pain C/o constant pain left UE    Mobility  Bed Mobility Bed Mobility: Not assessed Details for Bed Mobility Assistance: up in chair getting ready for d/c to SNF; family member helping her dress Transfers Sit to Stand: From chair/3-in-1;With armrests;2: Max assist Stand to Sit: 3: Mod assist;With armrests;To chair/3-in-1 Details for Transfer Assistance: cues and assist not to use left UE and to use armrest on right side for lifting and lowering; lifting and anterior weight shift assist to stand; cues and assist to slowly sit Ambulation/Gait Ambulation/Gait Assistance: Not tested (comment)      PT Goals (current goals can now be found in the care plan section)    Visit Information  Last PT Received On: 09/16/12 History of Present  Illness: Pt s/p NSTEMI with fall and left humeral comminuted nondisplaced fracture.    Subjective Data   Am I going to transfer today?   Cognition  Cognition Arousal/Alertness: Awake/alert Behavior During Therapy: WFL for tasks assessed/performed Overall Cognitive Status: Impaired/Different from baseline Memory: Decreased short-term memory    Balance  Static Standing Balance Static Standing - Balance Support: Right upper extremity supported;During functional activity Static Standing - Level of Assistance: 4: Min assist Static Standing - Comment/# of Minutes: assist for standing balance to don pants and put on robe prior to d/c to SNF later this pm  End of Session PT - End of Session Activity Tolerance: Patient limited by pain;Patient limited by fatigue Patient left: in chair;with family/visitor present   GP     [facility] 09/16/2012, 3:55 PM Sheran Lawless, PT [phone] 09/16/2012

## 2012-09-16 NOTE — Progress Notes (Signed)
Report given to RN at [facility].  Expect transport circa 3:30 per CSW.

## 2012-09-16 NOTE — Progress Notes (Signed)
Clinical Social Work Department CLINICAL SOCIAL WORK PLACEMENT NOTE 09/16/2012  Patient:  Beth Gutierrez, Beth Gutierrez  Account Number:  [id number] Admit date:  09/10/2012  Clinical Social Worker:  Carren Rang  Date/time:  09/13/2012 11:45 AM  Clinical Social Work is seeking post-discharge placement for this patient at the following level of care:   SKILLED NURSING   (*CSW will update this form in Epic as items are completed)   09/13/2012  Patient/family provided with [facility] Department of Clinical Social Work's list of facilities offering this level of care within the geographic area requested by the patient (or if unable, by the patient's family).  09/13/2012  Patient/family informed of their freedom to choose among providers that offer the needed level of care, that participate in Medicare, Medicaid or managed care program needed by the patient, have an available bed and are willing to accept the patient.  09/13/2012  Patient/family informed of [facility]' ownership interest in [facility], as well as of the fact that they are under no obligation to receive care at this facility.  PASARR submitted to EDS on 09/13/2012 PASARR number received from EDS on 09/13/2012  FL2 transmitted to all facilities in geographic area requested by pt/family on  09/13/2012 FL2 transmitted to all facilities within larger geographic area on   Patient informed that his/her managed care company has contracts with or will negotiate with  certain facilities, including the following:     Patient/family informed of bed offers received:  09/16/2012 Patient chooses bed at [facility] Physician recommends and patient chooses bed at    Patient to be transferred to [facility] on  09/16/2012 Patient to be transferred to facility by [facility]  The following physician request were entered in Epic:   Additional Comments:  Maree Krabbe, MSW, Theresia Majors [phone]

## 2012-09-16 NOTE — Progress Notes (Signed)
Clinical Social Worker facilitated patient discharge by contacting the patient, family and facility, [facility]. Patient agreeable to this plan and arranging transport via EMS. CSW will sign off, as social work intervention is no longer needed.  Jacen Carlini, MSW, LCSWA [phone]  

## 2012-09-16 NOTE — Progress Notes (Signed)
Occupational Therapy Treatment Patient Details Name: Beth Gutierrez MRN: [id number] DOB: 25-Feb-1921 Today's Date: 09/16/2012 Time: 4098-1191 OT Time Calculation (min): 20 min  OT Assessment / Plan / Recommendation  History of present illness 79 year old female resident of independent living multiple medical problems including known CAD and hypertension. Was sent to the emergency department after falling at home. Based on history patient apparently had a mechanical fall said she denied a sensation of feeling dizzy or any true syncope. She has significant pain in the left shoulder. X-ray revealed a nondisplaced comminuted impacted humeral neck fracture. Because of her history of coronary disease cardiac isoenzymes were checked and her troponin was elevated at 5.96. EKG was nonischemic.  Pt found to have NSTEMI.    OT comments  Pt with improved elbow ROM today as compared to last week. Waist strap modified to provide increased stability of LUE, especially during mobility. Discussed proper positioning of LUE with nsg staff. Continue to recommend SNF for rehab.  Follow Up Recommendations  SNF    Barriers to Discharge       Equipment Recommendations  None recommended by OT    Recommendations for Other Services    Frequency Min 2X/week   Progress towards OT Goals Progress towards OT goals: Progressing toward goals  Plan Discharge plan remains appropriate    Precautions / Restrictions Precautions Precautions: Fall Precaution Comments: no shoulder ROM allowed. sling at all times with the exception of ADL Required Braces or Orthoses: Sling Restrictions Weight Bearing Restrictions: Yes LUE Weight Bearing: Non weight bearing   Pertinent Vitals/Pain Pain LUE 7/10 with movement    ADL       OT Diagnosis:    OT Problem List:   OT Treatment Interventions:     OT Goals(current goals can now be found in the care plan section) Acute Rehab OT Goals Patient Stated Goal: to get  better OT Goal Formulation: With patient Time For Goal Achievement: 09/27/12 Potential to Achieve Goals: Good ADL Goals Pt Will Transfer to Toilet: with min assist;bedside commode Pt Will Perform Toileting - Clothing Manipulation and hygiene: with mod assist;with adaptive equipment;sit to/from stand;sitting/lateral leans Additional ADL Goal #1: pt will verbally instruct staff in correct positioning of LUE Additional ADL Goal #2: Pt will participate in L elbow/wristand hand A/AAROM within pain tolerance  Visit Information  Last OT Received On: 09/16/12 Assistance Needed: +1 History of Present Illness: 79 year old female resident of independent living multiple medical problems including known CAD and hypertension. Was sent to the emergency department after falling at home. Based on history patient apparently had a mechanical fall said she denied a sensation of feeling dizzy or any true syncope. She has significant pain in the left shoulder. X-ray revealed a nondisplaced comminuted impacted humeral neck fracture. Because of her history of coronary disease cardiac isoenzymes were checked and her troponin was elevated at 5.96. EKG was nonischemic.  Pt found to have NSTEMI.     Subjective Data      Prior Functioning       Cognition  Cognition Arousal/Alertness: Awake/alert Behavior During Therapy: WFL for tasks assessed/performed Overall Cognitive Status: Impaired/Different from baseline (appears confused at times) Memory: Decreased short-term memory    Mobility       Exercises  Other Exercises Other Exercises: L elbow wris and hand A/AAROM. elbow AAROM 10-100 Other Exercises: abdominal strap modified  to fit around waist   Balance     End of Session OT - End of Session Activity Tolerance: Patient limited  by pain Patient left: in chair;with call bell/phone within reach Nurse Communication: Weight bearing status;Precautions;Other (comment) (positioning of LUE)  GO      Hai Grabe,HILLARY 09/16/2012, 10:35 AM [facility], OTR/L  [phone] 09/16/2012

## 2012-09-17 ENCOUNTER — Non-Acute Institutional Stay (SKILLED_NURSING_FACILITY): Payer: Medicare Other | Admitting: Adult Health

## 2012-09-17 DIAGNOSIS — M109 Gout, unspecified: Secondary | ICD-10-CM

## 2012-09-17 DIAGNOSIS — S42209A Unspecified fracture of upper end of unspecified humerus, initial encounter for closed fracture: Secondary | ICD-10-CM

## 2012-09-17 DIAGNOSIS — I509 Heart failure, unspecified: Secondary | ICD-10-CM

## 2012-09-17 DIAGNOSIS — I214 Non-ST elevation (NSTEMI) myocardial infarction: Secondary | ICD-10-CM

## 2012-09-17 DIAGNOSIS — I10 Essential (primary) hypertension: Secondary | ICD-10-CM

## 2012-09-17 DIAGNOSIS — E785 Hyperlipidemia, unspecified: Secondary | ICD-10-CM

## 2012-09-17 DIAGNOSIS — D649 Anemia, unspecified: Secondary | ICD-10-CM

## 2012-09-17 NOTE — Assessment & Plan Note (Signed)
She is stable will continue lopressor 12.5 mg twice daily and asa 81 mg daily

## 2012-09-17 NOTE — Assessment & Plan Note (Signed)
Will continue her lipitor 80 mg daily and will monitor her status

## 2012-09-17 NOTE — Assessment & Plan Note (Signed)
She has a sling in place on her left upper arm; is being seen by therapy as directed; is followed by orthopedics. Is having pain present will being her ultram 50 mg every 6 hours as needed and will monitor her status

## 2012-09-17 NOTE — Assessment & Plan Note (Signed)
She has had a slight drop in her hgb. Her iron levels are low will being iron 325 mg daily and will monitor

## 2012-09-17 NOTE — Assessment & Plan Note (Signed)
Her ef is 30%; will continue her fluid restriction of 1.8 liters daily; will begin daily weights and call if she gains more than 3 pounds in one day or 5 pound sin one week and will monitor her status

## 2012-09-17 NOTE — Progress Notes (Signed)
Patient ID: Beth Gutierrez, female   DOB: November 14, 1921, 79 y.o.   MRN: [id number]  [facility]  Allergies  Allergen Reactions  . Sulfonamide Derivatives Other (See Comments)    unknown     Chief Complaint  Patient presents with  . Hospitalization Follow-up    HPI: She has been hospitalized following a fall in which she fractured her left humerus. She is unable to have surgery due to her cardiac function; the decision was made to treat her medically. She is here for short term rehab at this time. Her goal is to return to her previous living environment.    Past Medical History  Diagnosis Date  . Acute myocardial infarction, unspecified site, episode of care unspecified   . Congestive heart failure, unspecified   . Coronary atherosclerosis of unspecified type of vessel, native or graft   . Unspecified essential hypertension   . Other and unspecified hyperlipidemia   . Esophageal reflux   . Depressive disorder, not elsewhere classified   . Morbid obesity   . Need for prophylactic vaccination and inoculation against influenza   . Allergic rhinitis, cause unspecified   . Other malaise and fatigue   . Disorder of bone and cartilage, unspecified   . Diverticulosis of colon (without mention of hemorrhage)   . Benign neoplasm of colon   . Gout, unspecified   . Unspecified glaucoma(365.9)   . Osteoarthrosis, unspecified whether generalized or localized, unspecified site   . Gout flare 02/06/2011  . Anemia, unspecified 04/05/2011    Past Surgical History  Procedure Laterality Date  . Coronary angioplasty with stent placement      RCA stent  . Vesicovaginal fistula closure w/ tah    . Left hip replacement      s/p 2002. Secondary to DJD  . Ectopic pregnancy surgery      VITAL SIGNS There were no vitals taken for this visit.   Patient's Medications  New Prescriptions   No medications on file  Previous Medications   ALLOPURINOL (ZYLOPRIM) 100 MG TABLET    Take 1  tablet (100 mg total) by mouth daily.   ASPIRIN (ECOTRIN LOW STRENGTH) 81 MG EC TABLET    Take 81 mg by mouth daily.     ATORVASTATIN (LIPITOR) 80 MG TABLET    Take 1 tablet (80 mg total) by mouth daily at 6 PM.   DOCUSATE SODIUM (COLACE) 100 MG CAPSULE    Take 100 mg by mouth daily as needed for constipation.   METOPROLOL TARTRATE (LOPRESSOR) 12.5 MG TABS    Take 0.5 tablets (12.5 mg total) by mouth 2 (two) times daily.   ONDANSETRON (ZOFRAN) 4 MG TABLET    Take 1 tablet (4 mg total) by mouth every 8 (eight) hours as needed for nausea.   RANITIDINE (ZANTAC) 150 MG TABLET    Take 1 tablet (150 mg total) by mouth daily as needed. Acid reflux  Modified Medications   No medications on file  Discontinued Medications   No medications on file    SIGNIFICANT DIAGNOSTIC EXAMS  09-10-12: ct of head: Atrophy and chronic microvascular ischemic change.  No acute intracranial abnormality. Sinusitis. 09-10-12: left hand x-ray: No acute osseous findings.  Degenerative changes as described. 09-10-12: chest x-ray: Low lung volumes with resulting right basilar atelectasis.  Left humeral neck fracture. 09-10-12: left should x-ray: Comminuted fracture of the left humeral neck.  Severe underlying glenohumeral degenerative changes. 09-10-12: left humerus x-ray: Left humeral neck fracture as noted on the earlier  shoulder imaging.  No fractures elsewhere involving the left humerus. 09-10-12: right hip x-ray: No acute osseous abnormality.  Severe osteoarthritis involving the right hip. 09-11-12: 2- d echo: Left ventricle: Apical inferoapical and anteroapical akinesis Consistant with Taka Tsubo DCM The cavity size was moderately dilated. Systolic function was severely reduced. The estimated ejection fraction was in the range of 25% to 30%.- Left atrium: The atrium was mildly dilated. - Atrial septum: No defect or patent foramen ovale was identified.      LABS REVIEWED:   09-10-12: wbc 19.7; hgb 0.3; hct 32.0; mcv  100.0;p plt 297; glucose 118; bun 20; creat 1.40; k+3.9 Na++145; liver normal albumin 3.9; mag 1.9; hgb a1c 5.2; tsh 0.889; total ck 240 09-13-12: wbc 13.4; hgb 8.4; hct 25.6; mcv 97.7; plt 263; glucose 92; bun 35; creat 2.03; k+4.4 Na++ 142; osmolality 302; urine creat 181.37; urine osmol 548; urine sodium 32  Vit b12: 265; folate 6.5; iron 15; tibc 220; ferritin 154 09-14-12: wbc 10.2; hgb 8.1; hct 24.9; mcv 97.3; plt 301  09-16-12: glucose 93; bun 31; creat 1.54; k+4.1; na++ 141    Review of Systems  Constitutional: Negative for malaise/fatigue.  Respiratory: Negative for cough and shortness of breath.   Cardiovascular: Negative for chest pain and palpitations.  Musculoskeletal: Positive for joint pain.       Left shoulder pain   Skin: Negative.   Neurological: Negative for headaches.  Psychiatric/Behavioral: Negative for depression. The patient does not have insomnia.    Physical Exam  Constitutional: She appears well-developed and well-nourished.  obese  Neck: Neck supple. No JVD present. No thyromegaly present.  Cardiovascular: Normal rate, regular rhythm and intact distal pulses.   Respiratory: Effort normal and breath sounds normal. No respiratory distress. She has no wheezes.  GI: Soft. Bowel sounds are normal. She exhibits no distension. There is no tenderness.  Musculoskeletal: She exhibits edema.  Has left shoulder in sling has bilateral trace pedal edema   Neurological: She is alert.  Skin: Skin is warm and dry.  Psychiatric: She has a normal mood and affect.       ASSESSMENT/ PLAN:  GOUT No recent out breaks of gout will continue allopurinol 100 mg daily and will continue   HYPERTENSION She is stable will continue lopressor 12.5 mg twice daily and asa 81 mg daily   Dyslipidemia Will continue her lipitor 80 mg daily and will monitor her status   CONGESTIVE HEART FAILURE Her ef is 30%; will continue her fluid restriction of 1.8 liters daily; will begin daily  weights and call if she gains more than 3 pounds in one day or 5 pound sin one week and will monitor her status   Closed fracture of unspecified part of upper end of humerus She has a sling in place on her left upper arm; is being seen by therapy as directed; is followed by orthopedics. Is having pain present will being her ultram 50 mg every 6 hours as needed and will monitor her status   NSTEMI (non-ST elevated myocardial infarction) She is presently without change in her status she is being managed conservatively will continue her asa 81 mg daily and will monitor  Anemia, unspecified She has had a slight drop in her hgb. Her iron levels are low will being iron 325 mg daily and will monitor       Time spent with patient 50 minutes

## 2012-09-17 NOTE — Assessment & Plan Note (Signed)
No recent out breaks of gout will continue allopurinol 100 mg daily and will continue

## 2012-09-17 NOTE — Assessment & Plan Note (Signed)
She is presently without change in her status she is being managed conservatively will continue her asa 81 mg daily and will monitor

## 2012-09-23 ENCOUNTER — Non-Acute Institutional Stay (SKILLED_NURSING_FACILITY): Payer: Medicare Other | Admitting: Internal Medicine

## 2012-09-23 DIAGNOSIS — R531 Weakness: Secondary | ICD-10-CM | POA: Insufficient documentation

## 2012-09-23 DIAGNOSIS — I214 Non-ST elevation (NSTEMI) myocardial infarction: Secondary | ICD-10-CM

## 2012-09-23 DIAGNOSIS — D649 Anemia, unspecified: Secondary | ICD-10-CM

## 2012-09-23 DIAGNOSIS — S42209A Unspecified fracture of upper end of unspecified humerus, initial encounter for closed fracture: Secondary | ICD-10-CM

## 2012-09-23 DIAGNOSIS — R5383 Other fatigue: Secondary | ICD-10-CM

## 2012-09-23 DIAGNOSIS — I10 Essential (primary) hypertension: Secondary | ICD-10-CM

## 2012-09-23 DIAGNOSIS — K219 Gastro-esophageal reflux disease without esophagitis: Secondary | ICD-10-CM

## 2012-09-23 NOTE — Progress Notes (Signed)
Patient ID: Beth Gutierrez, female   DOB: [birth year], 79 y.o.   MRN: [id number]  [facility]  PCP: Oliver Barre, MD   Allergies  Allergen Reactions  . Sulfonamide Derivatives Other (See Comments)    unknown   Chief Complaint  Patient presents with  . Hospitalization Follow-up     HPI:  79 y/o female patient with hx of CAD, CHF, HTN was seen in the ED post fall with left shoulder pain. Xray showed non displaced comminuted humeral neck fracture. Her cardiac enzymes were elevated and cardiology was consulted. She underwent left heart catheterization and was found to have non occlusive CAD. Medical management was recommended. She also had ARF which improved with iv fluids. Given her weakness, she was sent to SNF for STR She was seen in her room with nursing staff. She complaints of pain in her left shoulder and being constipated.  Review of Systems  Constitutional: Negative for fever, chills and diaphoresis.  HENT: Negative for sore throat.   Eyes: Negative for blurred vision and double vision.  Respiratory: Negative for cough, shortness of breath and wheezing.   Cardiovascular: Negative for chest pain, palpitations and leg swelling.  Gastrointestinal: Negative for heartburn, nausea, vomiting and abdominal pain.  Genitourinary: Negative for dysuria and urgency.  Musculoskeletal: Negative for myalgias, joint pain and falls.  Skin: Negative for rash.  Neurological: Positive for weakness. Negative for dizziness, seizures and headaches.  Psychiatric/Behavioral: Negative for depression and memory loss.     Past Medical History  Diagnosis Date  . Acute myocardial infarction, unspecified site, episode of care unspecified   . Congestive heart failure, unspecified   . Coronary atherosclerosis of unspecified type of vessel, native or graft   . Unspecified essential hypertension   . Other and unspecified hyperlipidemia   . Esophageal reflux   . Depressive disorder, not elsewhere  classified   . Morbid obesity   . Need for prophylactic vaccination and inoculation against influenza   . Allergic rhinitis, cause unspecified   . Other malaise and fatigue   . Disorder of bone and cartilage, unspecified   . Diverticulosis of colon (without mention of hemorrhage)   . Benign neoplasm of colon   . Gout, unspecified   . Unspecified glaucoma(365.9)   . Osteoarthrosis, unspecified whether generalized or localized, unspecified site   . Gout flare 02/06/2011  . Anemia, unspecified 04/05/2011   Past Surgical History  Procedure Laterality Date  . Coronary angioplasty with stent placement      RCA stent  . Vesicovaginal fistula closure w/ tah    . Left hip replacement      s/p 2002. Secondary to DJD  . Ectopic pregnancy surgery     Social History:   reports that she quit smoking about 22 years ago. She has never used smokeless tobacco. She reports that she does not drink alcohol or use illicit drugs.  Family History  Problem Relation Age of Onset  . Breast cancer Sister   . Hypertension Father   . Stroke Father     Medications: Patient's Medications  New Prescriptions   No medications on file  Previous Medications   ALLOPURINOL (ZYLOPRIM) 100 MG TABLET    Take 1 tablet (100 mg total) by mouth daily.   ASPIRIN (ECOTRIN LOW STRENGTH) 81 MG EC TABLET    Take 81 mg by mouth daily.     ATORVASTATIN (LIPITOR) 80 MG TABLET    Take 1 tablet (80 mg total) by mouth daily at 6 PM.  DOCUSATE SODIUM (COLACE) 100 MG CAPSULE    Take 100 mg by mouth daily as needed for constipation.   METOPROLOL TARTRATE (LOPRESSOR) 12.5 MG TABS    Take 0.5 tablets (12.5 mg total) by mouth 2 (two) times daily.   ONDANSETRON (ZOFRAN) 4 MG TABLET    Take 1 tablet (4 mg total) by mouth every 8 (eight) hours as needed for nausea.   RANITIDINE (ZANTAC) 150 MG TABLET    Take 1 tablet (150 mg total) by mouth daily as needed. Acid reflux  Modified Medications   No medications on file  Discontinued  Medications   No medications on file     Filed Vitals:   09/23/12 1258  BP: 117/51  Pulse: 77  Temp: 98.5 F (36.9 C)  Resp: 18  SpO2: 99%   Physical Exam  Constitutional: She is oriented to person, place, and time. She appears well-developed and well-nourished. No distress.  HENT:  Head: Normocephalic and atraumatic.  Mouth/Throat: Oropharynx is clear and moist.  Eyes: Conjunctivae are normal. Pupils are equal, round, and reactive to light.  Neck: Normal range of motion. Neck supple. No JVD present.  Cardiovascular: Normal rate and regular rhythm.   No murmur heard. Pulmonary/Chest: Effort normal and breath sounds normal. No respiratory distress. She has no wheezes.  Abdominal: Soft. Bowel sounds are normal. There is no tenderness. There is no rebound and no guarding.  Musculoskeletal: Normal range of motion.  Except for left arm, is in a sling, able to move her digits, radial pulses are good  Lymphadenopathy:    She has no cervical adenopathy.  Neurological: She is alert and oriented to person, place, and time. No cranial nerve deficit.  Skin: Skin is warm and dry. She is not diaphoretic.  Psychiatric: She has a normal mood and affect. Her behavior is normal. Thought content normal.     Labs reviewed: Basic Metabolic Panel:  Recent Labs  16/10/96 1346 09/10/12 2056  09/14/12 0350 09/15/12 0446 09/16/12 0534  NA 145 141  < > 140 140 141  K 3.9 5.2*  < > 4.0 3.9 4.1  CL 107 108  < > 108 109 108  CO2 22 20  < > 22 21 21   GLUCOSE 118* 118*  < > 105* 109* 93  BUN 20 22  < > 43* 36* 31*  CREATININE 1.40* 1.40*  < > 1.78* 1.54* 1.54*  CALCIUM 9.8 8.8  < > 8.0* 8.2* 8.2*  MG  --  1.9  --   --   --   --   < > = values in this interval not displayed. Liver Function Tests:  Recent Labs  06/03/12 1559 09/10/12 1346 09/10/12 2056  AST 13 21 31   ALT 9 6 7   ALKPHOS 74 92 79  BILITOT 0.4 0.2* 0.3  PROT 6.9 7.1 6.4  ALBUMIN 3.8 3.9 3.3*    Recent Labs   05/28/12 0915 06/03/12 1559  LIPASE 21 14.0   No results found for this basename: AMMONIA,  in the last 8760 hours CBC:  Recent Labs  05/28/12 0915 06/03/12 1559 09/10/12 1346  09/12/12 0755 09/13/12 0515 09/14/12 0350  WBC 7.1 9.9 19.7*  < > 18.9* 13.4* 10.2  NEUTROABS 5.4 8.1* 17.9*  --   --   --   --   HGB 10.2* 10.6* 10.3*  < > 9.1* 8.4* 8.1*  HCT 31.9* 32.1* 32.0*  < > 28.3* 25.6* 24.9*  MCV 96.7 95.6 100.0  < > 98.3  97.7 97.3  PLT 269 324.0 297  < > 280 263 301  < > = values in this interval not displayed. Cardiac Enzymes:  Recent Labs  09/10/12 1346 09/10/12 2055 09/11/12 0255 09/11/12 0820  CKTOTAL 240*  --   --   --   TROPONINI 5.96* 6.10* 7.09* 5.16*   CBG:  Recent Labs  09/12/12 0817 09/12/12 1150 09/13/12 0604  GLUCAP 88 110* 89    09/20/12 wbc 10.9, hb 8.5, hct 28.2, plt 441, na 142, k 4.1, glu 110, bun 27, cr 1.2, ca 8.3  Radiological Exams:  Dg Chest 1 View  09/10/2012   *RADIOLOGY REPORT*  Clinical Data: Unwitnessed fall last night.  Left humeral fracture.  CHEST - 1 VIEW  Comparison: 06/03/2012.  Findings: There are lower lung volumes and more lordotic positioning currently.  Allowing for this, the heart size and mediastinal contours are stable.  There is increased linear atelectasis at the right lung base.  The left lung appears clear. Mildly comminuted fracture of the left humeral neck is noted.  No other acute osseous findings are identified.  Glenohumeral degenerative changes are present bilaterally.  IMPRESSION:  Low lung volumes with resulting right basilar atelectasis.  Left humeral neck fracture.   Original Report Authenticated By: Carey Bullocks, M.D.   Dg Hip Complete Right  09/10/2012   *RADIOLOGY REPORT*  Clinical Data: Larey Seat yesterday.  Right hip pain.  RIGHT HIP - COMPLETE 2+ VIEW  Comparison: None.  Findings: No evidence of acute fracture or dislocation.  Severe joint space narrowing with associated hypertrophic spurring involving the  acetabulum and the femoral head.  Moderate osseous demineralization.  Included AP pelvis demonstrates a prior left total hip arthroplasty with anatomic alignment and no complicating features.  Symphysis pubis intact.  Sacroiliac joints intact with degenerative changes. Degenerative changes involving the visualized lower lumbar spine.  IMPRESSION: No acute osseous abnormality.  Severe osteoarthritis involving the right hip.   Original Report Authenticated By: Hulan Saas, M.D.   Ct Head Wo Contrast  09/10/2012   *RADIOLOGY REPORT*  Clinical Data: Fall  CT HEAD WITHOUT CONTRAST  Technique:  Contiguous axial images were obtained from the base of the skull through the vertex without contrast.  Comparison: none  Findings: Generalized atrophy.  Chronic microvascular ischemic change in the white matter.  Negative for acute infarct.  Negative for hemorrhage mass or skull fracture.  Sinusitis with air-fluid levels in the left ethmoid and sphenoid sinuses as well as chronic mucosal thickening and bony thickening of the left ethmoid and sphenoid sinus.  IMPRESSION: Atrophy and chronic microvascular ischemic change.  No acute intracranial abnormality.  Sinusitis.   Original Report Authenticated By: Janeece Riggers, M.D.   Dg Shoulder Left  09/10/2012   *RADIOLOGY REPORT*  Clinical Data: Shoulder pain status post fall.  LEFT SHOULDER - 2+ VIEW  Comparison: None.  Findings: There is a comminuted and mildly displaced acute fracture involving the left humeral neck.  No involvement of the humeral head articular surface is identified.  There is no dislocation. There are underlying advanced glenohumeral degenerative changes. The subacromial space is preserved.  IMPRESSION: Comminuted fracture of the left humeral neck.  Severe underlying glenohumeral degenerative changes.   Original Report Authenticated By: Carey Bullocks, M.D.   Dg Humerus Left  09/10/2012   *RADIOLOGY REPORT*  Clinical Data: Fall yesterday.  Left humeral  neck fracture identified on earlier shoulder imaging.  LEFT HUMERUS - 2+ VIEW  Comparison: Left shoulder x-rays obtained earlier same date.  Findings: Comminuted impacted fracture involving the humeral neck as noted on the shoulder x-rays.  No fractures elsewhere involving the humerus.  Elbow joint intact with well-preserved joint spaces. Generalized osseous demineralization.  IMPRESSION: Left humeral neck fracture as noted on the earlier shoulder imaging.  No fractures elsewhere involving the left humerus.   Original Report Authenticated By: Hulan Saas, M.D.   Dg Hand Complete Left  09/10/2012   *RADIOLOGY REPORT*  Clinical Data: Hand injury status post fall last night.  LEFT HAND - COMPLETE 3+ VIEW  Comparison: None.  Findings: The bones appear mildly demineralized.  There is no evidence of acute fracture or dislocation.  There are moderately advanced degenerative changes at the first carpal metacarpal articulation. Mild degenerative changes are noted throughout the metacarpal phalangeal joints.  No focal soft tissue swelling is evident.  IMPRESSION: No acute osseous findings.  Degenerative changes as described.   Original Report Authenticated By: Carey Bullocks, M.D.     Assessment/Plan  NSTEMI- remains chest pain free. Continue asa, lipitor and lopressor for now  Generalized weakness- post NSTEMI and deconditioning, to work with PT/OT. Fall precautions. Gait stability training  Fracture of humerus- in a sling, no ROM exercise until further followed by orthopedic. Will change er tramadol to 50 mg 1-2 tab every 6 h for now for a week and then change to as needed. Will add robaxin 500 mg q8h prn for muscle spasm. Will have her colace changed to once a day for bowel movement.  Anemia of chronic disease- monitor cbc  gerd- continue ranitidine for now. Symptoms under control  HTN- bp remains stable, continue lopressor for now   Family/ staff Communication: reviewed care plan with patient and  nursing staff   Goals of care: return home post STR   Labs/tests ordered- cbc

## 2012-09-26 ENCOUNTER — Non-Acute Institutional Stay (SKILLED_NURSING_FACILITY): Payer: Medicare Other | Admitting: Adult Health

## 2012-09-26 DIAGNOSIS — S42302S Unspecified fracture of shaft of humerus, left arm, sequela: Secondary | ICD-10-CM

## 2012-09-26 DIAGNOSIS — S42309S Unspecified fracture of shaft of humerus, unspecified arm, sequela: Secondary | ICD-10-CM

## 2012-09-28 ENCOUNTER — Other Ambulatory Visit: Payer: Self-pay

## 2012-09-28 LAB — URINALYSIS, COMPLETE
Bilirubin,UR: NEGATIVE
Blood: NEGATIVE
Glucose,UR: NEGATIVE mg/dL (ref 0–75)
Ketone: NEGATIVE
Ph: 5 (ref 4.5–8.0)
Squamous Epithelial: 2

## 2012-09-30 LAB — URINE CULTURE

## 2012-10-02 ENCOUNTER — Ambulatory Visit (INDEPENDENT_AMBULATORY_CARE_PROVIDER_SITE_OTHER): Payer: Medicare Other | Admitting: Cardiology

## 2012-10-02 ENCOUNTER — Encounter: Payer: Self-pay | Admitting: Cardiology

## 2012-10-02 VITALS — BP 150/54 | HR 58 | Ht 62.0 in | Wt 199.0 lb

## 2012-10-02 DIAGNOSIS — I10 Essential (primary) hypertension: Secondary | ICD-10-CM

## 2012-10-02 DIAGNOSIS — I2510 Atherosclerotic heart disease of native coronary artery without angina pectoris: Secondary | ICD-10-CM

## 2012-10-02 DIAGNOSIS — I214 Non-ST elevation (NSTEMI) myocardial infarction: Secondary | ICD-10-CM

## 2012-10-02 DIAGNOSIS — I509 Heart failure, unspecified: Secondary | ICD-10-CM

## 2012-10-02 DIAGNOSIS — I252 Old myocardial infarction: Secondary | ICD-10-CM

## 2012-10-02 NOTE — Progress Notes (Signed)
HPI Beth Gutierrez returns today after being hospitalized with an acute MI felt secondary to Takosubu's cardiomyopathy. She is now [employer] actually likes it. She began having problems living independently and fallen secondary to her severe arthritis.  She denies any chest discomfort since discharge. Medications are reviewed from that facility.  Past Medical History  Diagnosis Date  . Acute myocardial infarction, unspecified site, episode of care unspecified   . Congestive heart failure, unspecified   . Coronary atherosclerosis of unspecified type of vessel, native or graft   . Unspecified essential hypertension   . Other and unspecified hyperlipidemia   . Esophageal reflux   . Depressive disorder, not elsewhere classified   . Morbid obesity   . Need for prophylactic vaccination and inoculation against influenza   . Allergic rhinitis, cause unspecified   . Other malaise and fatigue   . Disorder of bone and cartilage, unspecified   . Diverticulosis of colon (without mention of hemorrhage)   . Benign neoplasm of colon   . Gout, unspecified   . Unspecified glaucoma(365.9)   . Osteoarthrosis, unspecified whether generalized or localized, unspecified site   . Gout flare 02/06/2011  . Anemia, unspecified 04/05/2011    Current Outpatient Prescriptions  Medication Sig Dispense Refill  . acetaminophen (TYLENOL) 500 MG tablet Take 500 mg by mouth every 8 (eight) hours as needed for pain.      Marland Kitchen allopurinol (ZYLOPRIM) 100 MG tablet Take 1 tablet (100 mg total) by mouth daily.  90 tablet  3  . aspirin (ECOTRIN LOW STRENGTH) 81 MG EC tablet Take 81 mg by mouth daily.        Marland Kitchen atorvastatin (LIPITOR) 80 MG tablet Take 1 tablet (80 mg total) by mouth daily at 6 PM.      . docusate sodium (COLACE) 100 MG capsule Take 100 mg by mouth daily as needed for constipation.      . methocarbamol (ROBAXIN) 500 MG tablet Take 500 mg by mouth 3 (three) times daily.      . metoprolol tartrate (LOPRESSOR) 25  MG tablet Take 12.5 mg by mouth 2 (two) times daily.      . ondansetron (ZOFRAN) 4 MG tablet Take 1 tablet (4 mg total) by mouth every 8 (eight) hours as needed for nausea.  30 tablet  4  . ranitidine (ZANTAC) 150 MG tablet Take 1 tablet (150 mg total) by mouth daily as needed. Acid reflux  90 tablet  3   No current facility-administered medications for this visit.    Allergies  Allergen Reactions  . Sulfonamide Derivatives Other (See Comments)    unknown    Family History  Problem Relation Age of Onset  . Breast cancer Sister   . Hypertension Father   . Stroke Father     History   Social History  . Marital Status: Widowed    Spouse Name: N/A    Number of Children: 2  . Years of Education: N/A   Occupational History  . retired [employer]    Social History Main Topics  . Smoking status: Former Smoker    Quit date: 05/30/1990  . Smokeless tobacco: Never Used  . Alcohol Use: No  . Drug Use: No  . Sexual Activity: No   Other Topics Concern  . Not on file   Social History Narrative   Lives alone    ROS ALL NEGATIVE EXCEPT THOSE NOTED IN HPI  PE  General Appearance: well developed, well nourished in no acute  distress, morbidly obese, sitting in a wheelchair HEENT: symmetrical face, PERRLA, good dentition  Neck: no JVD, thyromegaly, or adenopathy, trachea midline Chest: symmetric without deformity Cardiac: PMI POORLY APPRECIATED, RRR, normal S1, S2, no gallop or murmur Lung: clear to ausculation and percussion Vascular: all pulses full without bruits  Abdominal: nondistended, nontender, good bowel sounds, no HSM, no bruits Extremities: no cyanosis, clubbing, 2+ edema at the ankles bilaterally, no sign of DVT, no varicosities  Skin: normal color, no rashes Neuro: alert and oriented x 3, non-focal Pysch: normal affect  EKG Not repeated  BMET    Component Value Date/Time   NA 141 09/16/2012 0534   K 4.1 09/16/2012 0534   CL 108 09/16/2012 0534   CO2 21  09/16/2012 0534   GLUCOSE 93 09/16/2012 0534   BUN 31* 09/16/2012 0534   CREATININE 1.54* 09/16/2012 0534   CALCIUM 8.2* 09/16/2012 0534   GFRNONAA 29* 09/16/2012 0534   GFRAA 33* 09/16/2012 0534    Lipid Panel     Component Value Date/Time   CHOL 174 04/05/2011 1204   TRIG 163.0* 04/05/2011 1204   HDL 42.60 04/05/2011 1204   CHOLHDL 4 04/05/2011 1204   VLDL 32.6 04/05/2011 1204   LDLCALC 99 04/05/2011 1204    CBC    Component Value Date/Time   WBC 10.2 09/14/2012 0350   RBC 2.56* 09/14/2012 0350   RBC 2.74* 09/13/2012 0935   HGB 8.1* 09/14/2012 0350   HCT 24.9* 09/14/2012 0350   PLT 301 09/14/2012 0350   MCV 97.3 09/14/2012 0350   MCH 31.6 09/14/2012 0350   MCHC 32.5 09/14/2012 0350   RDW 16.4* 09/14/2012 0350   LYMPHSABS 1.0 09/10/2012 1346   MONOABS 0.8 09/10/2012 1346   EOSABS 0.0 09/10/2012 1346   BASOSABS 0.0 09/10/2012 1346

## 2012-10-02 NOTE — Assessment & Plan Note (Signed)
She has recovered from this with no longer having any symptoms. Continue medical therapy. We'll treat conservatively considering her age and now loss of independence.

## 2012-10-02 NOTE — Patient Instructions (Addendum)
Your physician wants you to follow-up in: 6 MONTHS WITH DR Dietrich Pates You will receive a reminder letter in the mail two months in advance. If you don't receive a letter, please call our office to schedule the follow-up appointment.  Your physician recommends that you continue on your current medications as directed. Please refer to the Current Medication list given to you today.

## 2012-10-07 ENCOUNTER — Other Ambulatory Visit: Payer: Self-pay

## 2012-10-07 ENCOUNTER — Non-Acute Institutional Stay (SKILLED_NURSING_FACILITY): Payer: Medicare Other | Admitting: Adult Health

## 2012-10-07 ENCOUNTER — Encounter: Payer: Self-pay | Admitting: Adult Health

## 2012-10-07 DIAGNOSIS — S42209A Unspecified fracture of upper end of unspecified humerus, initial encounter for closed fracture: Secondary | ICD-10-CM

## 2012-10-07 DIAGNOSIS — I5022 Chronic systolic (congestive) heart failure: Secondary | ICD-10-CM

## 2012-10-07 MED ORDER — TRAMADOL HCL 50 MG PO TABS: 50.0000 mg | ORAL_TABLET | Freq: Four times a day (QID) | ORAL | Status: DC | PRN

## 2012-10-07 NOTE — Assessment & Plan Note (Signed)
With an ef of 30%; will continue her daily weights and fluid restriction; will begin low dose of lasix 20 mg daily due to increased edema 2+; will check bmp in one week and will monitor her.

## 2012-10-07 NOTE — Progress Notes (Signed)
Patient ID: Beth Gutierrez, female   DOB: 1921/02/17, 79 y.o.   MRN: [id number]  [facility]  Allergies  Allergen Reactions  . Sulfonamide Derivatives Other (See Comments)    unknown     Chief Complaint  Patient presents with  . Acute Visit    left shoulder pain and edema     HPI: Nursing reports that she is having increased left shoulder pain. She tells me that her pain has gotten worse since she began moving her arm in therapy. She does not think anything in particular is wrong with her arm and shoulder but would like her pain better managed. I have spoken with family regarding her pain management and they are in agreement to ultram once again. She is having increased lower extremity edema present; which her family is concerned is getting worse; she is on a fluid restriction and is on daily weights.   Past Medical History  Diagnosis Date  . Acute myocardial infarction, unspecified site, episode of care unspecified   . Congestive heart failure, unspecified   . Coronary atherosclerosis of unspecified type of vessel, native or graft   . Unspecified essential hypertension   . Other and unspecified hyperlipidemia   . Esophageal reflux   . Depressive disorder, not elsewhere classified   . Morbid obesity   . Need for prophylactic vaccination and inoculation against influenza   . Allergic rhinitis, cause unspecified   . Other malaise and fatigue   . Disorder of bone and cartilage, unspecified   . Diverticulosis of colon (without mention of hemorrhage)   . Benign neoplasm of colon   . Gout, unspecified   . Unspecified glaucoma(365.9)   . Osteoarthrosis, unspecified whether generalized or localized, unspecified site   . Gout flare 02/06/2011  . Anemia, unspecified 04/05/2011    Past Surgical History  Procedure Laterality Date  . Coronary angioplasty with stent placement      RCA stent  . Vesicovaginal fistula closure w/ tah    . Left hip replacement      s/p 2002.  Secondary to DJD  . Ectopic pregnancy surgery      VITAL SIGNS BP 118/55  Pulse 72  Ht 5\' 3"  (1.6 m)  Wt 204 lb (92.534 kg)  BMI 36.15 kg/m2   Patient's Medications  New Prescriptions   No medications on file  Previous Medications   ACETAMINOPHEN (TYLENOL) 500 MG TABLET    Take 1,000 mg by mouth 3 (three) times daily.    ALLOPURINOL (ZYLOPRIM) 100 MG TABLET    Take 1 tablet (100 mg total) by mouth daily.   ASPIRIN (ECOTRIN LOW STRENGTH) 81 MG EC TABLET    Take 81 mg by mouth daily.     ATORVASTATIN (LIPITOR) 80 MG TABLET    Take 1 tablet (80 mg total) by mouth daily at 6 PM.   DOCUSATE SODIUM (COLACE) 100 MG CAPSULE    Take 100 mg by mouth daily.    METHOCARBAMOL (ROBAXIN) 500 MG TABLET    Take 500 mg by mouth 3 (three) times daily as needed.    METOPROLOL TARTRATE (LOPRESSOR) 25 MG TABLET    Take 12.5 mg by mouth 2 (two) times daily.   ONDANSETRON (ZOFRAN) 4 MG TABLET    Take 1 tablet (4 mg total) by mouth every 8 (eight) hours as needed for nausea.   RANITIDINE (ZANTAC) 150 MG TABLET    Take 1 tablet (150 mg total) by mouth daily as needed. Acid reflux   TRAMADOL (  ULTRAM) 50 MG TABLET    Take 1 tablet (50 mg total) by mouth every 6 (six) hours as needed for pain (for [facility]).  Modified Medications   No medications on file  Discontinued Medications   No medications on file    SIGNIFICANT DIAGNOSTIC EXAMS  14: ct of head: Atrophy and chronic microvascular ischemic change.  No acute intracranial abnormality. Sinusitis. 09-10-12: left hand x-ray: No acute osseous findings.  Degenerative changes as described. 09-10-12: chest x-ray: Low lung volumes with resulting right basilar atelectasis.  Left humeral neck fracture. 09-10-12: left should x-ray: Comminuted fracture of the left humeral neck.  Severe underlying glenohumeral degenerative changes. 09-10-12: left humerus x-ray: Left humeral neck fracture as noted on the earlier shoulder imaging.  No fractures elsewhere  involving the left humerus. 09-10-12: right hip x-ray: No acute osseous abnormality.  Severe osteoarthritis involving the right hip. 09-11-12: 2- d echo: Left ventricle: Apical inferoapical and anteroapical akinesis Consistant with Taka Tsubo DCM The cavity size was moderately dilated. Systolic function was severely reduced. The estimated ejection fraction was in the range of 25% to 30%.- Left atrium: The atrium was mildly dilated. - Atrial septum: No defect or patent foramen ovale was identified.   10-07-12: left shoulder x-ray: moderate to severe diffuse osteopenia; moderate osteoarthritis; no subluxation, dislocation, or lytic destruction. healing fracture which is comminuted and slightly impacted involves the proximal humeral metaphysis/humeral neck. Old healed deformity at the coracoid process. No new fracture.  10-07-12: left humerus x-ray: mild to moderate diffuse osteopenia; comminuted healing fracture at the left proximal metaphysis/humeral neck. No new fracture.    LABS REVIEWED:   09-10-12: wbc 19.7; hgb 0.3; hct 32.0; mcv 100.0;p plt 297; glucose 118; bun 20; creat 1.40; k+3.9 Na++145; liver normal albumin 3.9; mag 1.9; hgb a1c 5.2; tsh 0.889; total ck 240 09-13-12: wbc 13.4; hgb 8.4; hct 25.6; mcv 97.7; plt 263; glucose 92; bun 35; creat 2.03; k+4.4 Na++ 142; osmolality 302; urine creat 181.37; urine osmol 548; urine sodium 32  Vit b12: 265; folate 6.5; iron 15; tibc 220; ferritin 154 09-14-12: wbc 10.2; hgb 8.1; hct 24.9; mcv 97.3; plt 301  09-16-12: glucose 93; bun 31; creat 1.54; k+4.1; na++ 141  09-20-12: wbc 10.9; hgb 8.5; hct 28.2; mcv 102.2; plt 441; glucose 81; bun 27; creat 1.2; k+4.1; na++142    Review of Systems  Constitutional: Negative for malaise/fatigue.  Respiratory: Negative for cough and shortness of breath.   Cardiovascular: Negative for chest pain and palpitations.  Musculoskeletal: Positive for joint pain.       Left shoulder pain   Skin: Negative.   Neurological:  Negative for headaches.  Psychiatric/Behavioral: Negative for depression. The patient does not have insomnia.    Physical Exam  Constitutional: She appears well-developed and well-nourished.  obese  Neck: Neck supple. No JVD present. No thyromegaly present.  Cardiovascular: Normal rate, regular rhythm and intact distal pulses.   Respiratory: Effort normal and breath sounds normal. No respiratory distress. She has no wheezes.  GI: Soft. Bowel sounds are normal. She exhibits no distension. There is no tenderness.  Musculoskeletal: She exhibits edema.  2+ ankle and lower leg.  Has left shoulder in sling has bilateral trace pedal edema   Neurological: She is alert.  Skin: Skin is warm and dry.  Psychiatric: She has a normal mood and affect.       ASSESSMENT/ PLAN:  Closed fracture of unspecified part of upper end of humerus Her pain is without change in intensity;  her x-rays do not demonstrate new fractures; will continue her current plan of care; I have given the patient reassurance that pain is expected with therapy and will continue her routinely tylenol and prn ultram. Will monitor her status   Chronic systolic heart failure With an ef of 30%; will continue her daily weights and fluid restriction; will begin low dose of lasix 20 mg daily due to increased edema 2+; will check bmp in one week and will monitor her.

## 2012-10-07 NOTE — Assessment & Plan Note (Signed)
Her pain is without change in intensity; her x-rays do not demonstrate new fractures; will continue her current plan of care; I have given the patient reassurance that pain is expected with therapy and will continue her routinely tylenol and prn ultram. Will monitor her status

## 2012-10-28 NOTE — Progress Notes (Signed)
Patient ID: Beth Gutierrez, female   DOB: Mar 28, 1921, 79 y.o.   MRN: [id number]  [facility]  Allergies  Allergen Reactions  . Sulfonamide Derivatives Other (See Comments)    unknown    Chief Complaint  Patient presents with  . Acute Visit    confusion    HPI  her family has expressed concern that she is more confused since she has been taking ultram for pain due to her shoulder fracture. The nursing staff tell that they have not noted any increase in confusion present since her admission to this facility. She is oriented to her surrounding except for the year. She states the medication she is currently taking is managing her pain adequately.   Past Medical History  Diagnosis Date  . Acute myocardial infarction, unspecified site, episode of care unspecified   . Congestive heart failure, unspecified   . Coronary atherosclerosis of unspecified type of vessel, native or graft   . Unspecified essential hypertension   . Other and unspecified hyperlipidemia   . Esophageal reflux   . Depressive disorder, not elsewhere classified   . Morbid obesity   . Need for prophylactic vaccination and inoculation against influenza   . Allergic rhinitis, cause unspecified   . Other malaise and fatigue   . Disorder of bone and cartilage, unspecified   . Diverticulosis of colon (without mention of hemorrhage)   . Benign neoplasm of colon   . Gout, unspecified   . Unspecified glaucoma(365.9)   . Osteoarthrosis, unspecified whether generalized or localized, unspecified site   . Gout flare 02/06/2011  . Anemia, unspecified 04/05/2011    Past Surgical History  Procedure Laterality Date  . Coronary angioplasty with stent placement      RCA stent  . Vesicovaginal fistula closure w/ tah    . Left hip replacement      s/p 2002. Secondary to DJD  . Ectopic pregnancy surgery      Current Outpatient Prescriptions on File Prior to Visit  Medication Sig Dispense Refill  . allopurinol  (ZYLOPRIM) 100 MG tablet Take 1 tablet (100 mg total) by mouth daily.  90 tablet  3  . aspirin (ECOTRIN LOW STRENGTH) 81 MG EC tablet Take 81 mg by mouth daily.        Marland Kitchen atorvastatin (LIPITOR) 80 MG tablet Take 1 tablet (80 mg total) by mouth daily at 6 PM.      . docusate sodium (COLACE) 100 MG capsule Take 100 mg by mouth daily.       . ondansetron (ZOFRAN) 4 MG tablet Take 1 tablet (4 mg total) by mouth every 8 (eight) hours as needed for nausea.  30 tablet  4  . ranitidine (ZANTAC) 150 MG tablet Take 1 tablet (150 mg total) by mouth daily as needed. Acid reflux  90 tablet  3   No current facility-administered medications on file prior to visit.    Filed Vitals:   09/26/12 1429  BP: 118/60  Pulse: 75  Height: 5\' 3"  (1.6 m)  Weight: 204 lb (92.534 kg)      SIGNIFICANT DIAGNOSTIC EXAMS  09-10-12: ct of head: Atrophy and chronic microvascular ischemic change.  No acute intracranial abnormality. Sinusitis. 09-10-12: left hand x-ray: No acute osseous findings.  Degenerative changes as described. 09-10-12: chest x-ray: Low lung volumes with resulting right basilar atelectasis.  Left humeral neck fracture. 09-10-12: left should x-ray: Comminuted fracture of the left humeral neck.  Severe underlying glenohumeral degenerative changes. 09-10-12: left humerus x-ray: Left  humeral neck fracture as noted on the earlier shoulder imaging.  No fractures elsewhere involving the left humerus. 09-10-12: right hip x-ray: No acute osseous abnormality.  Severe osteoarthritis involving the right hip. 09-11-12: 2- d echo: Left ventricle: Apical inferoapical and anteroapical akinesis Consistant with Taka Tsubo DCM The cavity size was moderately dilated. Systolic function was severely reduced. The estimated ejection fraction was in the range of 25% to 30%.- Left atrium: The atrium was mildly dilated. - Atrial septum: No defect or patent foramen ovale was identified.      LABS REVIEWED:   09-10-12: wbc 19.7;  hgb 0.3; hct 32.0; mcv 100.0;p plt 297; glucose 118; bun 20; creat 1.40; k+3.9 Na++145; liver normal albumin 3.9; mag 1.9; hgb a1c 5.2; tsh 0.889; total ck 240 09-13-12: wbc 13.4; hgb 8.4; hct 25.6; mcv 97.7; plt 263; glucose 92; bun 35; creat 2.03; k+4.4 Na++ 142; osmolality 302; urine creat 181.37; urine osmol 548; urine sodium 32  Vit b12: 265; folate 6.5; iron 15; tibc 220; ferritin 154 09-14-12: wbc 10.2; hgb 8.1; hct 24.9; mcv 97.3; plt 301  09-16-12: glucose 93; bun 31; creat 1.54; k+4.1; na++ 141    Review of Systems  Constitutional: Negative for malaise/fatigue.  Respiratory: Negative for cough and shortness of breath.   Cardiovascular: Negative for chest pain and palpitations.  Musculoskeletal: Positive for joint pain.       Left shoulder pain   Skin: Negative.   Neurological: Negative for headaches.  Psychiatric/Behavioral: Negative for depression. The patient does not have insomnia.    Physical Exam  Constitutional: She appears well-developed and well-nourished.  obese  Neck: Neck supple. No JVD present. No thyromegaly present.  Cardiovascular: Normal rate, regular rhythm and intact distal pulses.   Respiratory: Effort normal and breath sounds normal. No respiratory distress. She has no wheezes.  GI: Soft. Bowel sounds are normal. She exhibits no distension. There is no tenderness.  Musculoskeletal: She exhibits edema.  Has left shoulder in sling has bilateral trace pedal edema   Neurological: She is alert.  Skin: Skin is warm and dry.  Psychiatric: She has a normal mood and affect. Had correct day; month; date; location; incorrect year.    ASSESSMENT/PLAN  At this time due to the concerns of her family will stop the ultram due to any perceived confusion present. Will place her on routinely tylenol 1 gm three times daily and will monitor her pain closely   Time spent with patient 20 minutes  l

## 2012-11-04 ENCOUNTER — Non-Acute Institutional Stay (SKILLED_NURSING_FACILITY): Payer: Medicare Other | Admitting: Nurse Practitioner

## 2012-11-04 DIAGNOSIS — D649 Anemia, unspecified: Secondary | ICD-10-CM

## 2012-11-04 DIAGNOSIS — S42209A Unspecified fracture of upper end of unspecified humerus, initial encounter for closed fracture: Secondary | ICD-10-CM

## 2012-11-04 DIAGNOSIS — R5381 Other malaise: Secondary | ICD-10-CM

## 2012-11-04 DIAGNOSIS — I5022 Chronic systolic (congestive) heart failure: Secondary | ICD-10-CM

## 2012-11-04 DIAGNOSIS — R531 Weakness: Secondary | ICD-10-CM

## 2012-11-04 DIAGNOSIS — M109 Gout, unspecified: Secondary | ICD-10-CM

## 2012-11-19 ENCOUNTER — Telehealth: Payer: Self-pay | Admitting: *Deleted

## 2012-11-19 NOTE — Telephone Encounter (Signed)
Aletha called states pts foot and leg is swollen.  Please advise

## 2012-11-19 NOTE — Telephone Encounter (Signed)
Not sure what to say, except is this sudden? Is there pain? Is there CP, sob, fever or redness?

## 2012-11-19 NOTE — Telephone Encounter (Signed)
Called [facility], but due to length was on hold had to hang up.  Will try again

## 2012-11-20 NOTE — Telephone Encounter (Signed)
Called [facility] and spoke with RN Alethea.  She stated it did happen suddenly, but has gone back down to normal.  There has been no CP, fever, redness or pain.  They did state she take Lasix 20 mg.  Advise

## 2012-11-20 NOTE — Telephone Encounter (Signed)
Nursing home informed

## 2012-11-20 NOTE — Telephone Encounter (Signed)
Ok to monitor for now

## 2012-12-13 ENCOUNTER — Telehealth: Payer: Self-pay | Admitting: Internal Medicine

## 2012-12-13 NOTE — Telephone Encounter (Signed)
Nurse from [facility] call stated that Beth Gutierrez's left leg is swollen. The nurse was wondering what Dr. Jonny Ruiz would like to do about this? Please advise.

## 2012-12-13 NOTE — Telephone Encounter (Signed)
Already addressed on hardcopy today - for OV, or UC or ER if pain with swelling, fever, sob, palp's, CP

## 2012-12-17 ENCOUNTER — Ambulatory Visit (INDEPENDENT_AMBULATORY_CARE_PROVIDER_SITE_OTHER): Payer: Medicare Other | Admitting: Internal Medicine

## 2012-12-17 ENCOUNTER — Encounter: Payer: Self-pay | Admitting: Family Medicine

## 2012-12-17 ENCOUNTER — Encounter: Payer: Self-pay | Admitting: Internal Medicine

## 2012-12-17 ENCOUNTER — Ambulatory Visit (INDEPENDENT_AMBULATORY_CARE_PROVIDER_SITE_OTHER): Payer: Medicare Other | Admitting: Family Medicine

## 2012-12-17 VITALS — BP 142/68 | HR 86 | Wt 200.0 lb

## 2012-12-17 VITALS — BP 142/68 | HR 86 | Temp 97.4°F | Wt 200.0 lb

## 2012-12-17 DIAGNOSIS — I10 Essential (primary) hypertension: Secondary | ICD-10-CM

## 2012-12-17 DIAGNOSIS — F3289 Other specified depressive episodes: Secondary | ICD-10-CM

## 2012-12-17 DIAGNOSIS — M25519 Pain in unspecified shoulder: Secondary | ICD-10-CM

## 2012-12-17 DIAGNOSIS — F329 Major depressive disorder, single episode, unspecified: Secondary | ICD-10-CM

## 2012-12-17 DIAGNOSIS — M7500 Adhesive capsulitis of unspecified shoulder: Secondary | ICD-10-CM

## 2012-12-17 DIAGNOSIS — M109 Gout, unspecified: Secondary | ICD-10-CM

## 2012-12-17 DIAGNOSIS — E785 Hyperlipidemia, unspecified: Secondary | ICD-10-CM

## 2012-12-17 DIAGNOSIS — M25512 Pain in left shoulder: Secondary | ICD-10-CM

## 2012-12-17 DIAGNOSIS — M7502 Adhesive capsulitis of left shoulder: Secondary | ICD-10-CM

## 2012-12-17 MED ORDER — PREDNISONE 10 MG PO TABS: ORAL_TABLET | ORAL | Status: DC

## 2012-12-17 MED ORDER — METHYLPREDNISOLONE ACETATE 80 MG/ML IJ SUSP
80.0000 mg | Freq: Once | INTRAMUSCULAR | Status: AC
  Administered 2012-12-17: 80 mg via INTRAMUSCULAR

## 2012-12-17 NOTE — Assessment & Plan Note (Signed)
stable overall by history and exam, recent data reviewed with pt, and pt to continue medical treatment as before,  to f/u any worsening symptoms or concerns BP Readings from Last 3 Encounters:  12/17/12 142/68  10/07/12 118/55  10/02/12 150/54

## 2012-12-17 NOTE — Assessment & Plan Note (Signed)
Declines tx at this time,  to f/u any worsening symptoms or concerns

## 2012-12-17 NOTE — Assessment & Plan Note (Signed)
Also for lab f/u including cbc today

## 2012-12-17 NOTE — Assessment & Plan Note (Addendum)
?   Frozen shoulder - for refer sport med today  Note:  Total time for pt hx, exam, review of record with pt in the room, determination of diagnoses and plan for further eval and tx is > 40 min, with over 50% spent in coordination and counseling of patient

## 2012-12-17 NOTE — Patient Instructions (Signed)
Very nice to meet you We did an injection which I hope will help You have a frozen shoulder, please show handout to your physical therapist.  Do exercises daily Come back in 4 weeks.

## 2012-12-17 NOTE — Progress Notes (Signed)
Pre-visit discussion using our clinic review tool. No additional management support is needed unless otherwise documented below in the visit note.  

## 2012-12-17 NOTE — Progress Notes (Signed)
  I'm seeing this patient by the request  of:  Oliver Barre, MD   CC: Shoulder pain, left  HPI: Patient is a very pleasant 79 year old female coming in with left shoulder pain.  Patient did fall 3 months ago. Patient states she did fall her left-sided unfortunately was down for multiple hours. Since that time she has not had significant increase in range of motion of the shoulder. Patient did initially have significant amount of bruising. Patient states that the pain seems to be giving somewhat better but unfortunate she is still limited in range of motion. Patient is unable to comb her hair and has a very hard time getting dress on her own. Patient does live in an assisted living facility and is doing some physical therapy which he thinks is beneficial. Patient would like to avoid any medications if possible because she is scarified they will cause her to fall again. Patient is walking with the aid of a walker. Patient denies any radiation of the pain down the arm or any numbness. Patient was the severity of 6/10.  Past medical, surgical, family and social history reviewed. Medications reviewed all in the electronic medical record.   Review of Systems: No headache, visual changes, nausea, vomiting, diarrhea, constipation, dizziness, abdominal pain, skin rash, fevers, chills, night sweats, weight loss, swollen lymph nodes, body aches, joint swelling, muscle aches, chest pain, shortness of breath, mood changes.   Objective:    Blood pressure 142/68, pulse 86, weight 200 lb (90.719 kg), SpO2 90.00%.   General: No apparent distress alert and oriented x3 mood and affect normal, dressed appropriately. Patient is obese HEENT: Pupils equal, extraocular movements intact Respiratory: Patient's speak in full sentences and does not appear short of breath Cardiovascular: No lower extremity edema, non tender, no erythema Skin: Warm dry intact with no signs of infection or rash on extremities or on axial  skeleton. Abdomen: Soft nontender Neuro: Cranial nerves II through XII are intact, neurovascularly intact in all extremities with 2+ DTRs and 2+ pulses. Lymph: No lymphadenopathy of posterior or anterior cervical chain or axillae bilaterally.  Gait normal with good balance and coordination.  MSK: Non tender with full range of motion and good stability and symmetric strength and tone of elbows, wrist, hip, knee and ankles bilaterally.   Shoulder: Left Inspection reveals no abnormalities, atrophy or asymmetry. Palpation is normal with no tenderness over AC joint or bicipital groove. Patient does have limited passive and active range of motion in all planes especially the fourth flexion to 70, abduction to 45 and internal rotation to lateral hip. Patient contralateral side has full range of motion. Rotator cuff strength is difficult to check secondary to lack of motion. Positive signs of impingement with negative Neer and Hawkin's tests, empty can sign. Speeds and Yergason's tests normal. No labral pathology noted with negative Obrien's, negative clunk and good stability. Normal scapular function observed. No painful arc and no drop arm sign. No apprehension sign  After informed written and verbal consent, patient was seated on exam table. Left shoulder was prepped with alcohol swab and utilizing posterior approach, patient's right glenohumeral space was injected with 4:1  marcaine 0.5%: Kenalog 40mg /dL. Patient tolerated the procedure well without immediate complications.   Impression and Recommendations:     This case required medical decision making of moderate complexity.

## 2012-12-17 NOTE — Assessment & Plan Note (Signed)
Patient's physical exam is very consistent with adhesive capsulitis. Unable to completely obtain patient does have a rotator cuff tear. Patient did have an injection today and did have significant decrease in pain immediately. She'll try over-the-counter medications and indications that aren't been prescribed for her. In addition to this patient was given home exercise program which she will do daily. Patient also will start formal physical therapy at her assisted living facility a regular basis. Patient will come back again in 3-4 weeks for further evaluation. At that time we'll consider doing an ultrasound.

## 2012-12-17 NOTE — Progress Notes (Addendum)
Subjective:    Patient ID: Beth Gutierrez, female    DOB: 1921/02/23, 79 y.o.   MRN: [id number]  HPI    Here to f/u, c/p 2 wks onset LLE pain, just woke up with it one day, worst at the left ankle, with swelling to left leg at least mid calf, but no other calf or other leg pain per se.  Last fall per pt last spring 2014.  Did have large bruising left shoudler, seen in the ER, but left shoudler still with pain and marked reduction ROM to abcuction and forward elevation.  No overt other bruising or bleeding, has hx of severe anemia with indices c/w chronic inflammation aug 2014.  Does c/o ongoing fatigue.  Has lost several lbs, appetitie down, c/o depressed state, fatigue, no SI or HI, does not want tx, just pain and function improvement Past Medical History  Diagnosis Date  . Acute myocardial infarction, unspecified site, episode of care unspecified   . Congestive heart failure, unspecified   . Coronary atherosclerosis of unspecified type of vessel, native or graft   . Unspecified essential hypertension   . Other and unspecified hyperlipidemia   . Esophageal reflux   . Depressive disorder, not elsewhere classified   . Morbid obesity   . Need for prophylactic vaccination and inoculation against influenza   . Allergic rhinitis, cause unspecified   . Other malaise and fatigue   . Disorder of bone and cartilage, unspecified   . Diverticulosis of colon (without mention of hemorrhage)   . Benign neoplasm of colon   . Gout, unspecified   . Unspecified glaucoma(365.9)   . Osteoarthrosis, unspecified whether generalized or localized, unspecified site   . Gout flare 02/06/2011  . Anemia, unspecified 04/05/2011   Past Surgical History  Procedure Laterality Date  . Coronary angioplasty with stent placement      RCA stent  . Vesicovaginal fistula closure w/ tah    . Left hip replacement      s/p 2002. Secondary to DJD  . Ectopic pregnancy surgery      reports that she quit smoking about  22 years ago. She has never used smokeless tobacco. She reports that she does not drink alcohol or use illicit drugs. family history includes Breast cancer in her sister; Hypertension in her father; Stroke in her father. Allergies  Allergen Reactions  . Sulfonamide Derivatives Other (See Comments)    unknown   Current Outpatient Prescriptions on File Prior to Visit  Medication Sig Dispense Refill  . acetaminophen (TYLENOL) 500 MG tablet Take 1,000 mg by mouth 3 (three) times daily.       Marland Kitchen allopurinol (ZYLOPRIM) 100 MG tablet Take 1 tablet (100 mg total) by mouth daily.  90 tablet  3  . aspirin (ECOTRIN LOW STRENGTH) 81 MG EC tablet Take 81 mg by mouth daily.        Marland Kitchen atorvastatin (LIPITOR) 80 MG tablet Take 1 tablet (80 mg total) by mouth daily at 6 PM.      . docusate sodium (COLACE) 100 MG capsule Take 100 mg by mouth daily.       . methocarbamol (ROBAXIN) 500 MG tablet Take 500 mg by mouth 3 (three) times daily as needed.       . metoprolol tartrate (LOPRESSOR) 25 MG tablet Take 12.5 mg by mouth 2 (two) times daily.      . ondansetron (ZOFRAN) 4 MG tablet Take 1 tablet (4 mg total) by mouth every 8 (eight) hours as  needed for nausea.  30 tablet  4  . ranitidine (ZANTAC) 150 MG tablet Take 1 tablet (150 mg total) by mouth daily as needed. Acid reflux  90 tablet  3  . traMADol (ULTRAM) 50 MG tablet Take 1 tablet (50 mg total) by mouth every 6 (six) hours as needed for pain (for Breakthrough Pain).  30 tablet  5   No current facility-administered medications on file prior to visit.    Review of Systems  Constitutional: Negative for unexpected weight change, or unusual diaphoresis  HENT: Negative for tinnitus.   Eyes: Negative for photophobia and visual disturbance.  Respiratory: Negative for choking and stridor.   Gastrointestinal: Negative for vomiting and blood in stool.  Genitourinary: Negative for hematuria and decreased urine volume.  Musculoskeletal: Negative for acute joint  swelling Skin: Negative for color change and wound.  Neurological: Negative for tremors and numbness other than noted  Psychiatric/Behavioral: Negative for decreased concentration or  hyperactivity.       Objective:   Physical Exam BP 142/68  Pulse 86  Temp(Src) 97.4 F (36.3 C) (Oral)  Wt 200 lb (90.719 kg)  SpO2 90% VS noted,  Constitutional: Pt appears well-developed and well-nourished.  HENT: Head: NCAT.  Right Ear: External ear normal.  Left Ear: External ear normal.  Eyes: Conjunctivae and EOM are normal. Pupils are equal, round, and reactive to light.  Neck: Normal range of motion. Neck supple.  Cardiovascular: Normal rate and regular rhythm.   Pulmonary/Chest: Effort normal and breath sounds normal.  Abd:  Soft, NT, non-distended, + BS Neurological: Pt is alert. Not confused  Skin: Skin is warm. No erythema.  Left leg with 1+ edema to mid calf, ow NT Left ankle 2+ effusion, tender Left shouler NT but severe reduction in ROM to 10 degrees only active due to pain Psychiatric: Pt behavior is normal. Thought content normal.     Assessment & Plan:  Quality Measures addressed:  ACE/ARB therapy for CAD, Diabetes, and/or LVSD: pt declines further medication

## 2012-12-17 NOTE — Patient Instructions (Addendum)
You had the steroid shot today Please take all new medication as prescribed - the prednisone Please continue all other medications as before, and refills have been done if requested. Please have the pharmacy call with any other refills you may need. Please keep your appointments with your specialists as you may have planned  Please see Dr Katrinka Blazing at [facility] today for the left shoulder (sports medicine)  After your appt, Please go to the LAB in the Basement (turn left off the elevator) for the tests to be done today You will be contacted by phone if any changes need to be made immediately.  Otherwise, you will receive a letter about your results with an explanation, but please check with MyChart first.  Please remember to sign up for My Chart if you have not done so, as this will be important to you in the future with finding out test results, communicating by private email, and scheduling acute appointments online when needed.  Please return in 3 months, or sooner if needed

## 2012-12-17 NOTE — Assessment & Plan Note (Signed)
Clinical dx, Left ankle, for depomedrol IM, and predpack, to f/u any worsening symptoms or concerns

## 2012-12-26 ENCOUNTER — Other Ambulatory Visit: Payer: Self-pay | Admitting: Internal Medicine

## 2012-12-26 MED ORDER — PREDNISONE 10 MG PO TABS: ORAL_TABLET | ORAL | Status: DC

## 2012-12-26 NOTE — Telephone Encounter (Signed)
rx re-sent per facility request, did not receive first one

## 2012-12-27 ENCOUNTER — Telehealth: Payer: Self-pay | Admitting: Internal Medicine

## 2012-12-27 MED ORDER — PREDNISONE 10 MG PO TABS: ORAL_TABLET | ORAL | Status: DC

## 2012-12-27 NOTE — Telephone Encounter (Signed)
Faxed prescription to [facility] as requested.

## 2012-12-27 NOTE — Telephone Encounter (Signed)
12/27/2012  Laquita Redd, [facility], needs pt's for predniSONE (DELTASONE) 10 MG tablet to be sent to [facility], fax # [phone], instead of the [facility].  Laquita will need a new order for the pt to take this medication since it is past the initial order date 12/18/2012.  Contact Laquita if more info is needed, [phone].  Thanks.

## 2012-12-27 NOTE — Telephone Encounter (Signed)
Ok - to robin to handle 

## 2013-01-14 ENCOUNTER — Other Ambulatory Visit: Payer: Self-pay | Admitting: Internal Medicine

## 2013-01-14 ENCOUNTER — Ambulatory Visit: Payer: Medicare Other | Admitting: Family Medicine

## 2013-01-14 MED ORDER — AZITHROMYCIN 250 MG PO TABS: ORAL_TABLET | ORAL | Status: DC

## 2013-01-14 NOTE — Telephone Encounter (Signed)
See written note per [facility]

## 2013-01-21 ENCOUNTER — Ambulatory Visit (INDEPENDENT_AMBULATORY_CARE_PROVIDER_SITE_OTHER): Payer: Medicare Other | Admitting: Family Medicine

## 2013-01-21 ENCOUNTER — Encounter: Payer: Self-pay | Admitting: Family Medicine

## 2013-01-21 ENCOUNTER — Telehealth: Payer: Self-pay

## 2013-01-21 VITALS — BP 142/68 | HR 82

## 2013-01-21 DIAGNOSIS — M19012 Primary osteoarthritis, left shoulder: Secondary | ICD-10-CM | POA: Insufficient documentation

## 2013-01-21 DIAGNOSIS — M19019 Primary osteoarthritis, unspecified shoulder: Secondary | ICD-10-CM

## 2013-01-21 NOTE — Telephone Encounter (Signed)
Ok for verbal 

## 2013-01-21 NOTE — Telephone Encounter (Signed)
HHRN would like a verbal to see the patient 2 times this week, once next week and 2 times the following week.  A verbal is ok. [phone]

## 2013-01-21 NOTE — Progress Notes (Signed)
  CC: Shoulder pain, left follow up  HPI: Patient is a very pleasant 79 year old female coming in for followup of left shoulder pain. Patient was diagnosed with adhesive capsulitis at last office visit. Patient has been known formal physical therapy at her rehabilitation center, had one injection than previous visit, as well as doing an icing protocol. Patient states since the injection she has had relatively no pain. He should has been feeling very well. Patient actually has not been going to the classes at her retirement facility because she feels that she is doing well and is scared to hurt it. Patient was able to sleep without any significant discomfort.   Of note patient did have a comminuted fracture of the left humeral neck with severe osteoarthritic changes seen on x-ray back in July of 2014. This would definitely limited patient's function overall.  Past medical, surgical, family and social history reviewed. Medications reviewed all in the electronic medical record.   Review of Systems: No headache, visual changes, nausea, vomiting, diarrhea, constipation, dizziness, abdominal pain, skin rash, fevers, chills, night sweats, weight loss, swollen lymph nodes, body aches, joint swelling, muscle aches, chest pain, shortness of breath, mood changes.   Objective:    Blood pressure 142/68, pulse 82, SpO2 94.00%.   General: No apparent distress alert and oriented x3 mood and affect normal, dressed appropriately. Patient is obese HEENT: Pupils equal, extraocular movements intact Respiratory: Patient's speak in full sentences and does not appear short of breath Cardiovascular: No lower extremity edema, non tender, no erythema Skin: Warm dry intact with no signs of infection or rash on extremities or on axial skeleton. Abdomen: Soft nontender Neuro: Cranial nerves II through XII are intact, neurovascularly intact in all extremities with 2+ DTRs and 2+ pulses. Lymph: No lymphadenopathy of posterior  or anterior cervical chain or axillae bilaterally.  Gait normal with good balance and coordination.  MSK: Non tender with full range of motion and good stability and symmetric strength and tone of elbows, wrist, hip, knee and ankles bilaterally.   Shoulder: Left Inspection today with patient being in less pain allows me to see the patient does have mild deviation of the proximal humerus valgus. This is likely secondary to her previous fracture. Palpation is normal with no tenderness over AC joint or bicipital groove. Patient does have limited passive and active range of motion in all planes especially the  flexion to 70 actively but 95 passively, abduction to 45 and internal rotation to lateral hip. Significant crepitus felt to range of motion but nontender. Patient contralateral side has full range of motion. Rotator cuff strength shows the patient likely does not have rotator cuff intact Positive signs of impingement with negative Neer and Hawkin's tests, empty can sign. Speeds and Yergason's tests normal. Normal scapular function observed. No painful arc and no drop arm sign. No apprehension sign   Impression and Recommendations:     This case required medical decision making of moderate complexity.

## 2013-01-21 NOTE — Telephone Encounter (Signed)
HHRN informed 

## 2013-01-21 NOTE — Progress Notes (Signed)
Pre-visit discussion using our clinic review tool. No additional management support is needed unless otherwise documented below in the visit note.  

## 2013-01-21 NOTE — Patient Instructions (Signed)
It is good to see you Happy Holidays! Take tylenol 650 mg three times a day is the best evidence based medicine we have for arthritis.  Glucosamine sulfate 750mg  twice a day is a supplement that has been shown to help moderate to severe arthritis. Vitamin D 1000 IU daily Fish oil 2 grams daily.  Tumeric 500mg  twice daily.  Capsaicin topically up to four times a day may also help with pain. Cortisone injections are an option and we can repeat every 3 months if needed.  Try doing the clases 2 times a week but do not do any movements that cause pain.

## 2013-01-21 NOTE — Assessment & Plan Note (Signed)
Looking at patient's x-rays patient does have severe osteophytic changes with likely rotator cuff arthropathy. Patient has end-stage disease. Do not think she will gain any more range of motion back. Patient seems to be pain free at this time. Patient is encouraged to start doing some exercises for range of motion 2 times a week. Patient told other over-the-counter medications that can be helpful for her arthritic pain. Patient will followup on an as-needed basis. We can do a cortisone injection every 3-4 months for pain control. Patient is not a surgical candidate for shoulder replacement surgery.

## 2013-01-24 ENCOUNTER — Telehealth: Payer: Self-pay | Admitting: Internal Medicine

## 2013-01-24 NOTE — Telephone Encounter (Signed)
I have no position on this since this is not an FDA approved medication  i would not take

## 2013-01-24 NOTE — Telephone Encounter (Signed)
Nursing Home informed of MD instructions.

## 2013-01-24 NOTE — Telephone Encounter (Signed)
Beth Gutierrez called from [facility] stated that drug store does not have Tumeric 500 mg, they only have 450 mg. Beth Gutierrez was wondering if Dr. Jonny Ruiz want to change this med to 450 mg. If this is ok, please fax order to [phone].

## 2013-02-25 ENCOUNTER — Encounter: Payer: Self-pay | Admitting: Internal Medicine

## 2013-02-25 ENCOUNTER — Other Ambulatory Visit (INDEPENDENT_AMBULATORY_CARE_PROVIDER_SITE_OTHER): Payer: Medicare Other

## 2013-02-25 ENCOUNTER — Encounter: Payer: Self-pay | Admitting: Family Medicine

## 2013-02-25 ENCOUNTER — Telehealth: Payer: Self-pay

## 2013-02-25 ENCOUNTER — Ambulatory Visit (INDEPENDENT_AMBULATORY_CARE_PROVIDER_SITE_OTHER): Payer: Medicare Other | Admitting: Internal Medicine

## 2013-02-25 ENCOUNTER — Ambulatory Visit (INDEPENDENT_AMBULATORY_CARE_PROVIDER_SITE_OTHER): Payer: Medicare Other | Admitting: Family Medicine

## 2013-02-25 VITALS — BP 132/82 | HR 77 | Temp 98.2°F | Ht 66.0 in | Wt 200.0 lb

## 2013-02-25 DIAGNOSIS — M1710 Unilateral primary osteoarthritis, unspecified knee: Secondary | ICD-10-CM

## 2013-02-25 DIAGNOSIS — J209 Acute bronchitis, unspecified: Secondary | ICD-10-CM

## 2013-02-25 DIAGNOSIS — E785 Hyperlipidemia, unspecified: Secondary | ICD-10-CM

## 2013-02-25 DIAGNOSIS — M25561 Pain in right knee: Secondary | ICD-10-CM | POA: Insufficient documentation

## 2013-02-25 DIAGNOSIS — I10 Essential (primary) hypertension: Secondary | ICD-10-CM

## 2013-02-25 DIAGNOSIS — N182 Chronic kidney disease, stage 2 (mild): Secondary | ICD-10-CM

## 2013-02-25 DIAGNOSIS — M25569 Pain in unspecified knee: Secondary | ICD-10-CM

## 2013-02-25 LAB — CBC WITH DIFFERENTIAL/PLATELET
BASOS ABS: 0.1 10*3/uL (ref 0.0–0.1)
Basophils Relative: 0.8 % (ref 0.0–3.0)
EOS ABS: 0.1 10*3/uL (ref 0.0–0.7)
Eosinophils Relative: 0.7 % (ref 0.0–5.0)
LYMPHS ABS: 2.4 10*3/uL (ref 0.7–4.0)
Lymphocytes Relative: 22.2 % (ref 12.0–46.0)
MCHC: 31.8 g/dL (ref 30.0–36.0)
MCV: 83.7 fl (ref 78.0–100.0)
Monocytes Absolute: 0.5 10*3/uL (ref 0.1–1.0)
Monocytes Relative: 4.8 % (ref 3.0–12.0)
NEUTROS ABS: 7.8 10*3/uL — AB (ref 1.4–7.7)
Neutrophils Relative %: 71.5 % (ref 43.0–77.0)
Platelets: 305 10*3/uL (ref 150.0–400.0)
RBC: 2.8 Mil/uL — ABNORMAL LOW (ref 3.87–5.11)
RDW: 20.5 % — AB (ref 11.5–14.6)
WBC: 10.9 10*3/uL — ABNORMAL HIGH (ref 4.5–10.5)

## 2013-02-25 LAB — BASIC METABOLIC PANEL
BUN: 36 mg/dL — ABNORMAL HIGH (ref 6–23)
CALCIUM: 8.6 mg/dL (ref 8.4–10.5)
CO2: 26 meq/L (ref 19–32)
CREATININE: 1.4 mg/dL — AB (ref 0.4–1.2)
Chloride: 108 mEq/L (ref 96–112)
GFR: 45.82 mL/min — ABNORMAL LOW (ref >60.00)
Glucose, Bld: 93 mg/dL (ref 70–99)
Potassium: 5.1 mEq/L (ref 3.5–5.1)
Sodium: 141 mEq/L (ref 135–145)

## 2013-02-25 LAB — LIPID PANEL
CHOL/HDL RATIO: 2
Cholesterol: 132 mg/dL (ref 0–200)
HDL: 57.3 mg/dL (ref >39.00)
LDL CALC: 60 mg/dL (ref 0–99)
TRIGLYCERIDES: 72 mg/dL (ref 0.0–149.0)
VLDL: 14.4 mg/dL (ref 0.0–40.0)

## 2013-02-25 LAB — HEPATIC FUNCTION PANEL
ALT: 22 U/L (ref 0–35)
AST: 22 U/L (ref 0–37)
Albumin: 3.4 g/dL — ABNORMAL LOW (ref 3.5–5.2)
Alkaline Phosphatase: 88 U/L (ref 39–117)
BILIRUBIN DIRECT: 0.1 mg/dL (ref 0.0–0.3)
TOTAL PROTEIN: 6.7 g/dL (ref 6.0–8.3)
Total Bilirubin: 0.4 mg/dL (ref 0.3–1.2)

## 2013-02-25 LAB — TSH: TSH: 1.05 u[IU]/mL (ref 0.35–5.50)

## 2013-02-25 MED ORDER — AZITHROMYCIN 250 MG PO TABS: ORAL_TABLET | ORAL | Status: DC

## 2013-02-25 MED ORDER — HYDROCODONE-HOMATROPINE 5-1.5 MG/5ML PO SYRP: 5.0000 mL | ORAL_SOLUTION | Freq: Four times a day (QID) | ORAL | Status: DC | PRN

## 2013-02-25 NOTE — Patient Instructions (Signed)
Please take all new medication as prescribed Please continue all other medications as before, and refills have been done if requested. Please have the pharmacy call with any other refills you may need.  You are given the prescription for the compression stockings today  You are ok to see Dr Tamala Julian in the office today  Please go to the LAB in the Basement (turn left off the elevator) for the tests to be done today You will be contacted by phone if any changes need to be made immediately.  Otherwise, you will receive a letter about your results with an explanation, but please check with MyChart first.  Please remember to sign up for My Chart if you have not done so, as this will be important to you in the future with finding out test results, communicating by private email, and scheduling acute appointments online when needed.  Please return in 6 months, or sooner if needed

## 2013-02-25 NOTE — Patient Instructions (Signed)
It is wonderful to see you again.  I gave you an injection today in the knee Take tylenol 650 mg three times a day is the best evidence based medicine we have for arthritis.  Glucosamine sulfate 750mg  twice a day is a supplement that has been shown to help moderate to severe arthritis. Vitamin D 2000 IU daily Fish oil 2 grams daily.  Tumeric 500mg  twice daily.  Capsaicin topically up to four times a day may also help with pain. Cortisone injections are an option if these interventions do not seem to make a difference or need more relief.  If cortisone injections do not help, there are different types of shots that may help but they take longer to take effect.  We can discuss this at follow up.  Look  It's important that you continue to stay active.  Come back and see me in 4 weeks.

## 2013-02-25 NOTE — Progress Notes (Signed)
Subjective:    Patient ID: Beth Gutierrez, female    DOB: 27-Dec-1921, 80 y.o.   MRN: [id number]  HPI Here to f/u with daughter - Here to f/u; overall doing ok,  Pt denies chest pain, increased sob or doe, wheezing, orthopnea, PND, increased LE swelling, palpitations, dizziness or syncope.  Pt denies polydipsia, polyuria, or low sugar symptoms such as weakness or confusion improved with po intake.  Pt denies new neurological symptoms such as new headache, or facial or extremity weakness or numbness.   Pt states overall good compliance with meds, has been trying to follow lower cholesterol diet, with wt overall stable, walks with walker, no recent falls, lives at [place], c/o right knee pain worsening recently, nonsurgical candidate per Dr Bosie Clos but has transporation issues and asks to see sport med today in our office. Incidentally with c/o acute onset mild to mod 2-3 days ST, HA, general weakness and malaise, with prod cough greenish sputum  Past Medical History  Diagnosis Date  . Acute myocardial infarction, unspecified site, episode of care unspecified   . Congestive heart failure, unspecified   . Coronary atherosclerosis of unspecified type of vessel, native or graft   . Unspecified essential hypertension   . Other and unspecified hyperlipidemia   . Esophageal reflux   . Depressive disorder, not elsewhere classified   . Morbid obesity   . Need for prophylactic vaccination and inoculation against influenza   . Allergic rhinitis, cause unspecified   . Other malaise and fatigue   . Disorder of bone and cartilage, unspecified   . Diverticulosis of colon (without mention of hemorrhage)   . Benign neoplasm of colon   . Gout, unspecified   . Unspecified glaucoma   . Osteoarthrosis, unspecified whether generalized or localized, unspecified site   . Gout flare 02/06/2011  . Anemia, unspecified 04/05/2011   Past Surgical History  Procedure Laterality Date  . Coronary  angioplasty with stent placement      RCA stent  . Vesicovaginal fistula closure w/ tah    . Left hip replacement      s/p 2002. Secondary to DJD  . Ectopic pregnancy surgery      reports that she quit smoking about 22 years ago. She has never used smokeless tobacco. She reports that she does not drink alcohol or use illicit drugs. family history includes Breast cancer in her sister; Hypertension in her father; Stroke in her father. Allergies  Allergen Reactions  . Sulfonamide Derivatives Other (See Comments)    unknown   Current Outpatient Prescriptions on File Prior to Visit  Medication Sig Dispense Refill  . acetaminophen (TYLENOL) 500 MG tablet Take 1,000 mg by mouth 3 (three) times daily.       Marland Kitchen allopurinol (ZYLOPRIM) 100 MG tablet Take 1 tablet (100 mg total) by mouth daily.  90 tablet  3  . aspirin (ECOTRIN LOW STRENGTH) 81 MG EC tablet Take 81 mg by mouth daily.        Marland Kitchen atorvastatin (LIPITOR) 80 MG tablet Take 1 tablet (80 mg total) by mouth daily at 6 PM.      . azithromycin (ZITHROMAX Z-PAK) 250 MG tablet Use as directed  6 each  0  . docusate sodium (COLACE) 100 MG capsule Take 100 mg by mouth daily.       . methocarbamol (ROBAXIN) 500 MG tablet Take 500 mg by mouth 3 (three) times daily as needed.       . metoprolol tartrate (LOPRESSOR) 25  MG tablet Take 12.5 mg by mouth 2 (two) times daily.      . ondansetron (ZOFRAN) 4 MG tablet Take 1 tablet (4 mg total) by mouth every 8 (eight) hours as needed for nausea.  30 tablet  4  . ranitidine (ZANTAC) 150 MG tablet Take 1 tablet (150 mg total) by mouth daily as needed. Acid reflux  90 tablet  3  . traMADol (ULTRAM) 50 MG tablet Take 1 tablet (50 mg total) by mouth every 6 (six) hours as needed for pain (for Breakthrough Pain).  30 tablet  5  . predniSONE (DELTASONE) 10 MG tablet 3 tabs by mouth per day for 3 days,2tabs per day for 3 days,1tab per day for 3 days  18 tablet  0   No current facility-administered medications on file  prior to visit.    Dorothy Spark of Systems  Constitutional: Negative for unexpected weight change, or unusual diaphoresis  HENT: Negative for tinnitus.   Eyes: Negative for photophobia and visual disturbance.  Respiratory: Negative for choking and stridor.   Gastrointestinal: Negative for vomiting and blood in stool.  Genitourinary: Negative for hematuria and decreased urine volume.  Musculoskeletal: Negative for acute joint swelling Skin: Negative for color change and wound.  Neurological: Negative for tremors and numbness other than noted  Psychiatric/Behavioral: Negative for decreased concentration or  hyperactivity.       Objective:   Physical Exam BP 132/82  Pulse 77  Temp(Src) 98.2 F (36.8 C) (Oral)  Ht 5\' 6"  (1.676 m)  Wt 200 lb (90.719 kg)  BMI 32.30 kg/m2  SpO2 98% VS noted, mild ill Constitutional: Pt appears well-developed and well-nourished.  HENT: Head: NCAT.  Right Ear: External ear normal.  Left Ear: External ear normal.  Bilat tm's with mild erythema.  Max sinus areas non tender.  Pharynx with mild erythema, no exudate Eyes: Conjunctivae and EOM are normal. Pupils are equal, round, and reactive to light.  Neck: Normal range of motion. Neck supple.  Cardiovascular: Normal rate and regular rhythm.   Pulmonary/Chest: Effort normal and breath sounds decr bilat, few LLL crackle   Abd:  Soft, NT, non-distended, + BS Neurological: Pt is alert. Not confused  Skin: Skin is warm. No erythema. trace to 1+ edema on right, LLE with 1+ chronic, needs new compression stockings Psychiatric: Pt behavior is normal. Thought content normal.     Assessment & Plan:

## 2013-02-25 NOTE — Telephone Encounter (Signed)
Critical lab results hemoglobin 7.5 and hematocrit 23.5

## 2013-02-25 NOTE — Assessment & Plan Note (Signed)
stable overall by history and exam, recent data reviewed with pt, and pt to continue medical treatment as before,  to f/u any worsening symptoms or concerns Lab Results  Component Value Date   WBC 10.2 09/14/2012   HGB 8.1* 09/14/2012   HCT 24.9* 09/14/2012   PLT 301 09/14/2012   GLUCOSE 93 09/16/2012   CHOL 174 04/05/2011   TRIG 163.0* 04/05/2011   HDL 42.60 04/05/2011   LDLCALC 99 04/05/2011   ALT 7 09/10/2012   AST 31 09/10/2012   NA 141 09/16/2012   K 4.1 09/16/2012   CL 108 09/16/2012   CREATININE 1.54* 09/16/2012   BUN 31* 09/16/2012   CO2 21 09/16/2012   TSH 0.889 09/10/2012   INR 1.08 09/10/2012   HGBA1C 5.2 09/10/2012

## 2013-02-25 NOTE — Assessment & Plan Note (Signed)
stable overall by history and exam, recent data reviewed with pt, and pt to continue medical treatment as before,  to f/u any worsening symptoms or concerns BP Readings from Last 3 Encounters:  02/25/13 132/82  02/25/13 132/82  01/21/13 142/68

## 2013-02-25 NOTE — Progress Notes (Signed)
Pre-visit discussion using our clinic review tool. No additional management support is needed unless otherwise documented below in the visit note.  

## 2013-02-25 NOTE — Progress Notes (Signed)
  I'm seeing this patient by the request  of:  Cathlean Cower, MD  CC: Knee pain  HPI: Patient is an 80 year old female with significant comorbidities coming in with right knee pain. Patient has had this right knee pain for years. Secondary to her comorbidities she is unable to have any type of intervention. Patient is seen in the provider for this and has had injections the last one was in April. Patient does respond to injections fairly well. Patient though did have a fall which injured her shoulder which have seen her previously. Since that time she has not been back to seen get a provider and would like to be seen here. Patient has been told that she needed a knee replacement but secondary to her comorbidities and her a today would not attempt surgery.   Past medical, surgical, family and social history reviewed. Medications reviewed all in the electronic medical record.   Review of Systems: No headache, visual changes, nausea, vomiting, diarrhea, constipation, dizziness, abdominal pain, skin rash, fevers, chills, night sweats, weight loss, swollen lymph nodes, body aches, joint swelling, muscle aches, chest pain, shortness of breath, mood changes.   Objective:    Blood pressure 132/82, pulse 77, temperature 98.2 F (36.8 C), temperature source Oral, height 5\' 6"  (1.676 m), weight 200 lb (90.719 kg), SpO2 98.00%.   General: No apparent distress alert and oriented x3 mood and affect normal, dressed appropriately.  HEENT: Pupils equal, extraocular movements intact Respiratory: Patient's speak in full sentences and does not appear short of breath Cardiovascular: No lower extremity edema, non tender, no erythema Skin: Warm dry intact with no signs of infection or rash on extremities or on axial skeleton. Abdomen: Soft nontender Neuro: Cranial nerves II through XII are intact, neurovascularly intact in all extremities with 2+ DTRs and 2+ pulses. Lymph: No lymphadenopathy of posterior or anterior  cervical chain or axillae bilaterally.  Gait normal with good balance and coordination.  MSK: Non tender with full range of motion and good stability and symmetric strength and tone of  elbows, wrist, hip, and ankles bilaterally.  Knee: Right On inspection patient does have arthritic changes. Palpation does have patient being tender to palpation of the medial and lateral joint lines. ROM full in flexion and extension and lower leg rotation. Ligaments with solid consistent endpoints including ACL, PCL, LCL, MCL.  painful patellar compression. Patellar glide with moderate crepitus. Patellar and quadriceps tendons unremarkable. Hamstring and quadriceps strength is normal.   After informed written and verbal consent, patient was seated on exam table. Right knee was prepped with alcohol swab and utilizing anterolateral approach, patient's right knee space was injected with 4:1  marcaine 0.5%: Kenalog 40mg /dL. Patient tolerated the procedure well without immediate complications.   Impression and Recommendations:     This case required medical decision making of moderate complexity.

## 2013-02-25 NOTE — Assessment & Plan Note (Signed)
C/w djd - for sport med referral today

## 2013-02-25 NOTE — Assessment & Plan Note (Addendum)
Mild to mod, for antibx course,  to f/u any worsening symptoms or concerns, declines cxr today  Note:  Total time for pt hx, exam, review of record with pt in the room, determination of diagnoses and plan for further eval and tx is > 40 min, with over 50% spent in coordination and counseling of patient

## 2013-02-25 NOTE — Assessment & Plan Note (Signed)
stable overall by history and exam, recent data reviewed with pt, and pt to continue medical treatment as before,  to f/u any worsening symptoms or concerns Lab Results  Component Value Date   LDLCALC 99 04/05/2011   For f/u labs today

## 2013-02-25 NOTE — Assessment & Plan Note (Signed)
Patient did have injection today. Patient was given different over-the-counter medications that could be beneficial. Patient was given home exercise program to try to do range of motion. Patient does angulate somewhat with the aid of a cane in she'll continue this. Patient come back in 4 weeks. If she continues to have pain she would be a candidate for viscous supplementation. She has never tried this previously.

## 2013-02-26 ENCOUNTER — Other Ambulatory Visit: Payer: Self-pay | Admitting: Internal Medicine

## 2013-02-26 ENCOUNTER — Ambulatory Visit: Payer: Medicare Other

## 2013-02-26 ENCOUNTER — Encounter: Payer: Self-pay | Admitting: Internal Medicine

## 2013-02-26 DIAGNOSIS — F209 Schizophrenia, unspecified: Secondary | ICD-10-CM

## 2013-02-26 DIAGNOSIS — D509 Iron deficiency anemia, unspecified: Secondary | ICD-10-CM

## 2013-02-26 LAB — IBC PANEL
Iron: 15 ug/dL — ABNORMAL LOW (ref 42–145)
Saturation Ratios: 3.4 % — ABNORMAL LOW (ref 20.0–50.0)
Transferrin: 317.1 mg/dL (ref 212.0–360.0)

## 2013-02-26 NOTE — Progress Notes (Signed)
[facility], room number 02/15/2004 Date of Visit 11/04/2012  Patient ID: Beth Gutierrez, female   DOB: Apr 14, 1921, 80 y.o.   MRN: [id number]  [facility]  Allergies  Allergen Reactions  . Sulfonamide Derivatives Other (See Comments)    unknown   Chief Complaint  Patient presents with  . Discharge Note    HPI: Patient is an 80 year old , status post fracture of left humerus. Has received physical therapy but still with an ongoing complaint of pain. She has a history of cardiomyopathy with an ejection fraction of approximately 25-30%. She's had difficulty with lower extremity edema and has been on a fluid restriction. She is scheduled today for discharge.  Review of Systems  HENT: Negative for ear discharge and ear pain.   Eyes: Negative for pain, discharge and redness.  Cardiovascular: Positive for leg swelling.  Gastrointestinal: Negative for blood in stool.  Genitourinary: Negative for hematuria and flank pain.  Musculoskeletal:       Left shoulder pain  Neurological: Positive for weakness. Negative for seizures and loss of consciousness.  Psychiatric/Behavioral: Negative for hallucinations.        Past Medical History  Diagnosis Date  . Acute myocardial infarction, unspecified site, episode of care unspecified   . Congestive heart failure, unspecified   . Coronary atherosclerosis of unspecified type of vessel, native or graft   . Unspecified essential hypertension   . Other and unspecified hyperlipidemia   . Esophageal reflux   . Depressive disorder, not elsewhere classified   . Morbid obesity   . Need for prophylactic vaccination and inoculation against influenza   . Allergic rhinitis, cause unspecified   . Other malaise and fatigue   . Disorder of bone and cartilage, unspecified   . Diverticulosis of colon (without mention of hemorrhage)   . Benign neoplasm of colon   . Gout, unspecified   . Unspecified glaucoma   . Osteoarthrosis,  unspecified whether generalized or localized, unspecified site   . Gout flare 02/06/2011  . Anemia, unspecified 04/05/2011    Past Surgical History  Procedure Laterality Date  . Coronary angioplasty with stent placement      RCA stent  . Vesicovaginal fistula closure w/ tah    . Left hip replacement      s/p 2002. Secondary to DJD  . Ectopic pregnancy surgery      VITAL SIGNS BP 112/62, pulse 84, temp 97.3   Patient's Medications  New Prescriptions   No medications on file  Previous Medications   ACETAMINOPHEN (TYLENOL) 500 MG TABLET    Take 1,000 mg by mouth 3 (three) times daily.    ALLOPURINOL (ZYLOPRIM) 100 MG TABLET    Take 1 tablet (100 mg total) by mouth daily.   ASPIRIN (ECOTRIN LOW STRENGTH) 81 MG EC TABLET    Take 81 mg by mouth daily.     ATORVASTATIN (LIPITOR) 80 MG TABLET    Take 1 tablet (80 mg total) by mouth daily at 6 PM.   AZITHROMYCIN (ZITHROMAX Z-PAK) 250 MG TABLET    Use as directed   AZITHROMYCIN (ZITHROMAX Z-PAK) 250 MG TABLET    Use as directed   DOCUSATE SODIUM (COLACE) 100 MG CAPSULE    Take 100 mg by mouth daily.    HYDROCODONE-HOMATROPINE (HYCODAN) 5-1.5 MG/5ML SYRUP    Take 5 mLs by mouth every 6 (six) hours as needed for cough.   METHOCARBAMOL (ROBAXIN) 500 MG TABLET    Take 500 mg by mouth 3 (three) times  daily as needed.    METOPROLOL TARTRATE (LOPRESSOR) 25 MG TABLET    Take 12.5 mg by mouth 2 (two) times daily.   ONDANSETRON (ZOFRAN) 4 MG TABLET    Take 1 tablet (4 mg total) by mouth every 8 (eight) hours as needed for nausea.   PREDNISONE (DELTASONE) 10 MG TABLET    3 tabs by mouth per day for 3 days,2tabs per day for 3 days,1tab per day for 3 days   RANITIDINE (ZANTAC) 150 MG TABLET    Take 1 tablet (150 mg total) by mouth daily as needed. Acid reflux   TRAMADOL (ULTRAM) 50 MG TABLET    Take 1 tablet (50 mg total) by mouth every 6 (six) hours as needed for pain (for Breakthrough Pain).  Modified Medications   No medications on file    Discontinued Medications   No medications on file    SIGNIFICANT DIAGNOSTIC EXAMS  14: ct of head: Atrophy and chronic microvascular ischemic change.  No acute intracranial abnormality. Sinusitis. 09-10-12: left hand x-ray: No acute osseous findings.  Degenerative changes as described. 09-10-12: chest x-ray: Low lung volumes with resulting right basilar atelectasis.  Left humeral neck fracture. 09-10-12: left should x-ray: Comminuted fracture of the left humeral neck.  Severe underlying glenohumeral degenerative changes. 09-10-12: left humerus x-ray: Left humeral neck fracture as noted on the earlier shoulder imaging.  No fractures elsewhere involving the left humerus. 09-10-12: right hip x-ray: No acute osseous abnormality.  Severe osteoarthritis involving the right hip. 09-11-12: 2- d echo: Left ventricle: Apical inferoapical and anteroapical akinesis Consistant with Taka Tsubo DCM The cavity size was moderately dilated. Systolic function was severely reduced. The estimated ejection fraction was in the range of 25% to 30%.- Left atrium: The atrium was mildly dilated. - Atrial septum: No defect or patent foramen ovale was identified.   10-07-12: left shoulder x-ray: moderate to severe diffuse osteopenia; moderate osteoarthritis; no subluxation, dislocation, or lytic destruction. healing fracture which is comminuted and slightly impacted involves the proximal humeral metaphysis/humeral neck. Old healed deformity at the coracoid process. No new fracture.  10-07-12: left humerus x-ray: mild to moderate diffuse osteopenia; comminuted healing fracture at the left proximal metaphysis/humeral neck. No new fracture.    LABS REVIEWED:   10/15/2012 Sodium 146 Potassium 4 Chloride 111 CO2 25 AGAP 10 Glucose 97 BUN 21 Creatinine 1.3 Calcium 9.1  10/11/2012 Creatinine 1.2 BUN 26  09-10-12: wbc 19.7; hgb 0.3; hct 32.0; mcv 100.0;p plt 297; glucose 118; bun 20; creat 1.40; k+3.9 Na++145; liver  normal albumin 3.9; mag 1.9; hgb a1c 5.2; tsh 0.889; total ck 240 09-13-12: wbc 13.4; hgb 8.4; hct 25.6; mcv 97.7; plt 263; glucose 92; bun 35; creat 2.03; k+4.4 Na++ 142; osmolality 302; urine creat 181.37; urine osmol 548; urine sodium 32  Vit b12: 265; folate 6.5; iron 15; tibc 220; ferritin 154 09-14-12: wbc 10.2; hgb 8.1; hct 24.9; mcv 97.3; plt 301  09-16-12: glucose 93; bun 31; creat 1.54; k+4.1; na++ 141  09-20-12: wbc 10.9; hgb 8.5; hct 28.2; mcv 102.2; plt 441; glucose 81; bun 27; creat 1.2; k+4.1; na++142    Review of Systems  Constitutional: Negative for malaise/fatigue.  Respiratory: Negative for cough and shortness of breath.   Cardiovascular: Negative for chest pain and palpitations.  Musculoskeletal: Positive for joint pain.       Left shoulder pain   Skin: Negative.   Neurological: Negative for headaches.  Psychiatric/Behavioral: Negative for depression. The patient does not have insomnia.  Physical Exam  Constitutional: She appears well-developed and well-nourished. alert, verbally appropriate in no acute distress.  Pupils are equally round and reactive to light.  Ears are unremarkable  Oral pharynx without any gross oral lesions.  Neck: Neck supple. No JVD present. No thyromegaly present. No palpable cervical adenopathy   Cardiovascular: Apical pulse with a regular rate and rhythm, palpable pedal pulses..   1+ pitting edema noted at lower legs otherwise unremarkable.  Respiratory: Respirations are easy and unlabored, no adventitious breath sounds appreciated   GI: positive bowel sounds soft, nontender to palpation  Skin: Without any apparent open lesions  Left arm remains in sling, she continues with limited range of motion, but less tenderness to palpation.  Neurological: Independent movement of all 4 extremities though she is certainly very reluctant to move her left arm.  Assessment/plan  Patient is to be discharged to an assisted living facility. Her home  health services will include DT/OT, for the purpose of strengthening, mobility, at least safe transfer, safety, and adaptation for ADLs. It is critical that the patient regained at least some increased range of motion of her left shoulder. She will have a wheelchair.  Pain, the patient will have acetaminophen, she may take 1000 mg no more than 3 times a day. She also will have hydrocodone/homatropine 5-1.5 mg per 5 ML's, 5 ML was every 6 hours as needed for pain know this should probably be limited. Only limited amount was written.  Also, a limited amount of tramadol 50 mg every 6 hours as needed for pain. Any further need for pain medication will be followed by her orthopedist.  Muscle spasms Robaxin 500 mg up to 3 times a day.  Cardiomyopathy/congestive heart failure, will continue her Lopressor 25 mg, one half tablet twice a day. We'll also continue her aspirin 81 mg a day Her lower showed edema will have to be carefully monitored, and the use of diuretic weight against the patient's renal function. She will be followed closely by her.  Gout she'll continue on her allopurinol 100 mg per day.  GERD, will continue on her ranitidine 150 mg each day as needed.  Constipation Colace 100 mg per day may be held if stools are loose.  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   Patient will have careful followup by her primary care provider, cardiologist, and orthopedist. Other specialists as indicated.

## 2013-03-04 ENCOUNTER — Ambulatory Visit: Payer: Medicare Other | Admitting: Internal Medicine

## 2013-03-10 ENCOUNTER — Encounter: Payer: Self-pay | Admitting: Internal Medicine

## 2013-03-13 ENCOUNTER — Telehealth: Payer: Self-pay

## 2013-03-13 NOTE — Telephone Encounter (Signed)
Done hardcopy to robin  

## 2013-03-13 NOTE — Telephone Encounter (Signed)
Mailed prescription to the patients home address.  Callled to inform no answer and no vm.

## 2013-03-13 NOTE — Telephone Encounter (Signed)
The patient called and is hoping to get a rx for compression hose.  She states she received one pair, but is in need of a couple more pairs.

## 2013-03-19 ENCOUNTER — Ambulatory Visit: Payer: Medicare Other | Admitting: Internal Medicine

## 2013-03-20 ENCOUNTER — Other Ambulatory Visit (INDEPENDENT_AMBULATORY_CARE_PROVIDER_SITE_OTHER): Payer: Medicare Other

## 2013-03-20 ENCOUNTER — Ambulatory Visit (INDEPENDENT_AMBULATORY_CARE_PROVIDER_SITE_OTHER): Payer: Medicare Other | Admitting: Internal Medicine

## 2013-03-20 ENCOUNTER — Encounter: Payer: Self-pay | Admitting: Internal Medicine

## 2013-03-20 VITALS — BP 130/84 | HR 77 | Temp 97.9°F | Wt 202.0 lb

## 2013-03-20 DIAGNOSIS — D509 Iron deficiency anemia, unspecified: Secondary | ICD-10-CM

## 2013-03-20 DIAGNOSIS — F329 Major depressive disorder, single episode, unspecified: Secondary | ICD-10-CM

## 2013-03-20 DIAGNOSIS — M1710 Unilateral primary osteoarthritis, unspecified knee: Secondary | ICD-10-CM

## 2013-03-20 DIAGNOSIS — F3289 Other specified depressive episodes: Secondary | ICD-10-CM

## 2013-03-20 LAB — CBC WITH DIFFERENTIAL/PLATELET
Basophils Absolute: 0 10*3/uL (ref 0.0–0.1)
Basophils Relative: 0.4 % (ref 0.0–3.0)
EOS ABS: 0.1 10*3/uL (ref 0.0–0.7)
Eosinophils Relative: 1.4 % (ref 0.0–5.0)
HCT: 31.2 % — ABNORMAL LOW (ref 36.0–46.0)
Hemoglobin: 9.9 g/dL — ABNORMAL LOW (ref 12.0–15.0)
LYMPHS PCT: 19 % (ref 12.0–46.0)
Lymphs Abs: 1.6 10*3/uL (ref 0.7–4.0)
MCHC: 31.7 g/dL (ref 30.0–36.0)
MCV: 92.2 fl (ref 78.0–100.0)
MONO ABS: 0.6 10*3/uL (ref 0.1–1.0)
Monocytes Relative: 7.1 % (ref 3.0–12.0)
NEUTROS ABS: 5.9 10*3/uL (ref 1.4–7.7)
Neutrophils Relative %: 72.1 % (ref 43.0–77.0)
Platelets: 325 10*3/uL (ref 150.0–400.0)
RBC: 3.38 Mil/uL — ABNORMAL LOW (ref 3.87–5.11)
RDW: 28.4 % — AB (ref 11.5–14.6)
WBC: 8.2 10*3/uL (ref 4.5–10.5)

## 2013-03-20 MED ORDER — CITALOPRAM HYDROBROMIDE 10 MG PO TABS: 10.0000 mg | ORAL_TABLET | Freq: Every day | ORAL | Status: DC

## 2013-03-20 NOTE — Patient Instructions (Addendum)
Please take all new medication as prescribed - the citalopram 10 mg per day for depression Please continue all other medications as before, including the iron Please have the pharmacy call with any other refills you may need.  Please go to the LAB in the Basement (turn left off the elevator) for the tests to be done today You will be contacted by phone if any changes need to be made immediately.  Otherwise, you will receive a letter about your results with an explanation, but please check with MyChart first.  You will be contacted regarding the referral for: orthopedic - Dr Percell Miller, and GI - to see PCC's now if possible  Please return in 3 months, or sooner if needed

## 2013-03-20 NOTE — Progress Notes (Signed)
Subjective:    Patient ID: Beth Gutierrez, female    DOB: 12-05-1921, 80 y.o.   MRN: [id number]  HPI  Here to f/u, sort of giving up hope that she will ever get better, so has not cooperated pt states with activities at [facility]. Spends most of her time apart.  Has had mild worsening depressive symptoms, but no suicidal ideation, or panic; has ongoing anxiety, not increased recently.  Has seen orthopedic at least twice Dr Bosie Clos, was told she could not have more than 2-3 cortisone per yr to right knee, so she feels it is time to go back since it has been 2 yrs, and pain is 7/10.  Has ultram prn as well.  Willing to start med like SSRI for depression. Does have several very small bruises to hands, no other overt bleeding or bruising. Has been taking the iron bid. Was never contacted about GI referral per pt Past Medical History  Diagnosis Date  . Acute myocardial infarction, unspecified site, episode of care unspecified   . Congestive heart failure, unspecified   . Coronary atherosclerosis of unspecified type of vessel, native or graft   . Unspecified essential hypertension   . Other and unspecified hyperlipidemia   . Esophageal reflux   . Depressive disorder, not elsewhere classified   . Morbid obesity   . Need for prophylactic vaccination and inoculation against influenza   . Allergic rhinitis, cause unspecified   . Other malaise and fatigue   . Disorder of bone and cartilage, unspecified   . Diverticulosis of colon (without mention of hemorrhage)   . Benign neoplasm of colon   . Gout, unspecified   . Unspecified glaucoma   . Osteoarthrosis, unspecified whether generalized or localized, unspecified site   . Gout flare 02/06/2011  . Anemia, unspecified 04/05/2011   Past Surgical History  Procedure Laterality Date  . Coronary angioplasty with stent placement      RCA stent  . Vesicovaginal fistula closure w/ tah    . Left hip replacement      s/p 2002. Secondary to  DJD  . Ectopic pregnancy surgery      reports that she quit smoking about 22 years ago. She has never used smokeless tobacco. She reports that she does not drink alcohol or use illicit drugs. family history includes Breast cancer in her sister; Hypertension in her father; Stroke in her father. Allergies  Allergen Reactions  . Sulfonamide Derivatives Other (See Comments)    unknown   Current Outpatient Prescriptions on File Prior to Visit  Medication Sig Dispense Refill  . acetaminophen (TYLENOL) 500 MG tablet Take 1,000 mg by mouth 3 (three) times daily.       Marland Kitchen allopurinol (ZYLOPRIM) 100 MG tablet Take 1 tablet (100 mg total) by mouth daily.  90 tablet  3  . aspirin (ECOTRIN LOW STRENGTH) 81 MG EC tablet Take 81 mg by mouth daily.        Marland Kitchen atorvastatin (LIPITOR) 80 MG tablet Take 1 tablet (80 mg total) by mouth daily at 6 PM.      . docusate sodium (COLACE) 100 MG capsule Take 100 mg by mouth daily.       . methocarbamol (ROBAXIN) 500 MG tablet Take 500 mg by mouth 3 (three) times daily as needed.       . metoprolol tartrate (LOPRESSOR) 25 MG tablet Take 12.5 mg by mouth 2 (two) times daily.      . ondansetron (ZOFRAN) 4 MG tablet Take  1 tablet (4 mg total) by mouth every 8 (eight) hours as needed for nausea.  30 tablet  4  . traMADol (ULTRAM) 50 MG tablet Take 1 tablet (50 mg total) by mouth every 6 (six) hours as needed for pain (for Breakthrough Pain).  30 tablet  5   No current facility-administered medications on file prior to visit.   Review of Systems  Constitutional: Negative for unexpected weight change, or unusual diaphoresis  HENT: Negative for tinnitus.   Eyes: Negative for photophobia and visual disturbance.  Respiratory: Negative for choking and stridor.   Gastrointestinal: Negative for vomiting and blood in stool.  Genitourinary: Negative for hematuria and decreased urine volume.  Musculoskeletal: Negative for acute joint swelling Skin: Negative for color change and  wound.  Neurological: Negative for tremors and numbness other than noted  Psychiatric/Behavioral: Negative for decreased concentration or  hyperactivity.       Objective:   Physical Exam BP 130/84  Pulse 77  Temp(Src) 97.9 F (36.6 C) (Oral)  Wt 202 lb (91.627 kg)  SpO2 96% VS noted,  Constitutional: Pt appears well-developed and well-nourished. Beth Gutierrez, in wheelchair HENT: Head: NCAT.  Right Ear: External ear normal.  Left Ear: External ear normal.  Eyes: Conjunctivae and EOM are normal. Pupils are equal, round, and reactive to light.  Neck: Normal range of motion. Neck supple.  Cardiovascular: Normal rate and regular rhythm.   Pulmonary/Chest: Effort normal and breath sounds normal.  Abd:  Soft, NT, non-distended, + BS Neurological: Pt is alert. Not confused  Skin: Skin is warm. No erythema.  Right knee severe OA change, NT, 1+ effusion Psychiatric: Pt behavior is normal. Thought content normal. + depressed affect    Assessment & Plan:

## 2013-03-20 NOTE — Assessment & Plan Note (Signed)
For citalopram 10 qd,  to f/u any worsening symptoms or concerns

## 2013-03-20 NOTE — Assessment & Plan Note (Signed)
No overt bleeding, for f/u cbc today, refer GI

## 2013-03-20 NOTE — Assessment & Plan Note (Signed)
Pt requests ortho eval though has seen sport med, will defer to pt

## 2013-03-20 NOTE — Progress Notes (Signed)
Pre-visit discussion using our clinic review tool. No additional management support is needed unless otherwise documented below in the visit note.  

## 2013-03-24 ENCOUNTER — Observation Stay (HOSPITAL_COMMUNITY)
Admission: EM | Admit: 2013-03-24 | Discharge: 2013-03-27 | Disposition: A | Discharge status: Skilled Nursing Facility | Payer: Medicare Other | Attending: Internal Medicine | Admitting: Internal Medicine

## 2013-03-24 ENCOUNTER — Emergency Department (HOSPITAL_COMMUNITY): Payer: Medicare Other

## 2013-03-24 ENCOUNTER — Encounter (HOSPITAL_COMMUNITY): Payer: Self-pay | Admitting: Emergency Medicine

## 2013-03-24 DIAGNOSIS — R112 Nausea with vomiting, unspecified: Secondary | ICD-10-CM

## 2013-03-24 DIAGNOSIS — R1013 Epigastric pain: Secondary | ICD-10-CM

## 2013-03-24 DIAGNOSIS — Z9861 Coronary angioplasty status: Secondary | ICD-10-CM | POA: Insufficient documentation

## 2013-03-24 DIAGNOSIS — Z0000 Encounter for general adult medical examination without abnormal findings: Secondary | ICD-10-CM

## 2013-03-24 DIAGNOSIS — K2961 Other gastritis with bleeding: Principal | ICD-10-CM

## 2013-03-24 DIAGNOSIS — R5383 Other fatigue: Secondary | ICD-10-CM

## 2013-03-24 DIAGNOSIS — I10 Essential (primary) hypertension: Secondary | ICD-10-CM

## 2013-03-24 DIAGNOSIS — M25512 Pain in left shoulder: Secondary | ICD-10-CM

## 2013-03-24 DIAGNOSIS — H409 Unspecified glaucoma: Secondary | ICD-10-CM

## 2013-03-24 DIAGNOSIS — M7502 Adhesive capsulitis of left shoulder: Secondary | ICD-10-CM

## 2013-03-24 DIAGNOSIS — N183 Chronic kidney disease, stage 3 unspecified: Secondary | ICD-10-CM | POA: Insufficient documentation

## 2013-03-24 DIAGNOSIS — I129 Hypertensive chronic kidney disease with stage 1 through stage 4 chronic kidney disease, or unspecified chronic kidney disease: Secondary | ICD-10-CM | POA: Insufficient documentation

## 2013-03-24 DIAGNOSIS — M19012 Primary osteoarthritis, left shoulder: Secondary | ICD-10-CM

## 2013-03-24 DIAGNOSIS — M25511 Pain in right shoulder: Secondary | ICD-10-CM

## 2013-03-24 DIAGNOSIS — Z7982 Long term (current) use of aspirin: Secondary | ICD-10-CM | POA: Insufficient documentation

## 2013-03-24 DIAGNOSIS — M949 Disorder of cartilage, unspecified: Secondary | ICD-10-CM

## 2013-03-24 DIAGNOSIS — D126 Benign neoplasm of colon, unspecified: Secondary | ICD-10-CM

## 2013-03-24 DIAGNOSIS — K5730 Diverticulosis of large intestine without perforation or abscess without bleeding: Secondary | ICD-10-CM

## 2013-03-24 DIAGNOSIS — F3289 Other specified depressive episodes: Secondary | ICD-10-CM

## 2013-03-24 DIAGNOSIS — R5381 Other malaise: Secondary | ICD-10-CM

## 2013-03-24 DIAGNOSIS — K920 Hematemesis: Secondary | ICD-10-CM

## 2013-03-24 DIAGNOSIS — I2510 Atherosclerotic heart disease of native coronary artery without angina pectoris: Secondary | ICD-10-CM

## 2013-03-24 DIAGNOSIS — N182 Chronic kidney disease, stage 2 (mild): Secondary | ICD-10-CM

## 2013-03-24 DIAGNOSIS — K921 Melena: Secondary | ICD-10-CM

## 2013-03-24 DIAGNOSIS — J309 Allergic rhinitis, unspecified: Secondary | ICD-10-CM

## 2013-03-24 DIAGNOSIS — S42209A Unspecified fracture of upper end of unspecified humerus, initial encounter for closed fracture: Secondary | ICD-10-CM

## 2013-03-24 DIAGNOSIS — D649 Anemia, unspecified: Secondary | ICD-10-CM

## 2013-03-24 DIAGNOSIS — R634 Abnormal weight loss: Secondary | ICD-10-CM

## 2013-03-24 DIAGNOSIS — R531 Weakness: Secondary | ICD-10-CM

## 2013-03-24 DIAGNOSIS — R269 Unspecified abnormalities of gait and mobility: Secondary | ICD-10-CM

## 2013-03-24 DIAGNOSIS — D509 Iron deficiency anemia, unspecified: Secondary | ICD-10-CM

## 2013-03-24 DIAGNOSIS — M1990 Unspecified osteoarthritis, unspecified site: Secondary | ICD-10-CM

## 2013-03-24 DIAGNOSIS — I509 Heart failure, unspecified: Secondary | ICD-10-CM | POA: Insufficient documentation

## 2013-03-24 DIAGNOSIS — N390 Urinary tract infection, site not specified: Secondary | ICD-10-CM

## 2013-03-24 DIAGNOSIS — M25561 Pain in right knee: Secondary | ICD-10-CM

## 2013-03-24 DIAGNOSIS — M109 Gout, unspecified: Secondary | ICD-10-CM

## 2013-03-24 DIAGNOSIS — E785 Hyperlipidemia, unspecified: Secondary | ICD-10-CM

## 2013-03-24 DIAGNOSIS — M1710 Unilateral primary osteoarthritis, unspecified knee: Secondary | ICD-10-CM

## 2013-03-24 DIAGNOSIS — F329 Major depressive disorder, single episode, unspecified: Secondary | ICD-10-CM

## 2013-03-24 DIAGNOSIS — I214 Non-ST elevation (NSTEMI) myocardial infarction: Secondary | ICD-10-CM

## 2013-03-24 DIAGNOSIS — R197 Diarrhea, unspecified: Secondary | ICD-10-CM

## 2013-03-24 DIAGNOSIS — M899 Disorder of bone, unspecified: Secondary | ICD-10-CM

## 2013-03-24 DIAGNOSIS — K219 Gastro-esophageal reflux disease without esophagitis: Secondary | ICD-10-CM

## 2013-03-24 DIAGNOSIS — K269 Duodenal ulcer, unspecified as acute or chronic, without hemorrhage or perforation: Secondary | ICD-10-CM | POA: Insufficient documentation

## 2013-03-24 DIAGNOSIS — Z87891 Personal history of nicotine dependence: Secondary | ICD-10-CM | POA: Insufficient documentation

## 2013-03-24 DIAGNOSIS — I252 Old myocardial infarction: Secondary | ICD-10-CM

## 2013-03-24 DIAGNOSIS — M79609 Pain in unspecified limb: Secondary | ICD-10-CM

## 2013-03-24 DIAGNOSIS — K260 Acute duodenal ulcer with hemorrhage: Secondary | ICD-10-CM

## 2013-03-24 DIAGNOSIS — I5022 Chronic systolic (congestive) heart failure: Secondary | ICD-10-CM

## 2013-03-24 DIAGNOSIS — I428 Other cardiomyopathies: Secondary | ICD-10-CM | POA: Insufficient documentation

## 2013-03-24 HISTORY — DX: Pneumonia, unspecified organism: J18.9

## 2013-03-24 HISTORY — DX: Acute duodenal ulcer with hemorrhage: K26.0

## 2013-03-24 HISTORY — DX: Headache: R51

## 2013-03-24 LAB — CBC WITH DIFFERENTIAL/PLATELET
BASOS PCT: 0 % (ref 0–1)
Basophils Absolute: 0 10*3/uL (ref 0.0–0.1)
Eosinophils Absolute: 0.1 10*3/uL (ref 0.0–0.7)
Eosinophils Relative: 1 % (ref 0–5)
HCT: 33.1 % — ABNORMAL LOW (ref 36.0–46.0)
HEMOGLOBIN: 10.2 g/dL — AB (ref 12.0–15.0)
Lymphocytes Relative: 17 % (ref 12–46)
Lymphs Abs: 1.3 10*3/uL (ref 0.7–4.0)
MCH: 29 pg (ref 26.0–34.0)
MCHC: 30.8 g/dL (ref 30.0–36.0)
MCV: 94 fL (ref 78.0–100.0)
MONO ABS: 0.6 10*3/uL (ref 0.1–1.0)
Monocytes Relative: 8 % (ref 3–12)
NEUTROS PCT: 74 % (ref 43–77)
Neutro Abs: 5.7 10*3/uL (ref 1.7–7.7)
Platelets: 237 10*3/uL (ref 150–400)
RBC: 3.52 MIL/uL — AB (ref 3.87–5.11)
RDW: 25 % — ABNORMAL HIGH (ref 11.5–15.5)
WBC: 7.7 10*3/uL (ref 4.0–10.5)

## 2013-03-24 LAB — COMPREHENSIVE METABOLIC PANEL
ALT: 14 U/L (ref 0–35)
AST: 29 U/L (ref 0–37)
Albumin: 3.5 g/dL (ref 3.5–5.2)
Alkaline Phosphatase: 68 U/L (ref 39–117)
BUN: 25 mg/dL — AB (ref 6–23)
CHLORIDE: 104 meq/L (ref 96–112)
CO2: 22 mEq/L (ref 19–32)
Calcium: 8.8 mg/dL (ref 8.4–10.5)
Creatinine, Ser: 1.25 mg/dL — ABNORMAL HIGH (ref 0.50–1.10)
GFR calc Af Amer: 43 mL/min — ABNORMAL LOW (ref >90)
GFR calc non Af Amer: 37 mL/min — ABNORMAL LOW (ref >90)
Glucose, Bld: 80 mg/dL (ref 70–99)
POTASSIUM: 4.6 meq/L (ref 3.7–5.3)
Sodium: 142 mEq/L (ref 137–147)
Total Protein: 6.6 g/dL (ref 6.0–8.3)

## 2013-03-24 LAB — TYPE AND SCREEN
ABO/RH(D): A POS
Antibody Screen: POSITIVE
DAT, IgG: NEGATIVE
PT AG TYPE: NEGATIVE

## 2013-03-24 LAB — HEMOGLOBIN AND HEMATOCRIT, BLOOD
HCT: 31.1 % — ABNORMAL LOW (ref 36.0–46.0)
HEMOGLOBIN: 9.5 g/dL — AB (ref 12.0–15.0)

## 2013-03-24 LAB — URINALYSIS, ROUTINE W REFLEX MICROSCOPIC
BILIRUBIN URINE: NEGATIVE
Glucose, UA: NEGATIVE mg/dL
Ketones, ur: NEGATIVE mg/dL
Leukocytes, UA: NEGATIVE
NITRITE: NEGATIVE
PROTEIN: 30 mg/dL — AB
SPECIFIC GRAVITY, URINE: 1.017 (ref 1.005–1.030)
UROBILINOGEN UA: 0.2 mg/dL (ref 0.0–1.0)
pH: 5 (ref 5.0–8.0)

## 2013-03-24 LAB — RETICULOCYTES
RBC.: 3.52 MIL/uL — AB (ref 3.87–5.11)
RETIC COUNT ABSOLUTE: 109.1 10*3/uL (ref 19.0–186.0)
RETIC CT PCT: 3.1 % (ref 0.4–3.1)

## 2013-03-24 LAB — URINE MICROSCOPIC-ADD ON

## 2013-03-24 LAB — OCCULT BLOOD, POC DEVICE: Fecal Occult Bld: POSITIVE — AB

## 2013-03-24 LAB — LIPASE, BLOOD: LIPASE: 19 U/L (ref 11–59)

## 2013-03-24 MED ORDER — ONDANSETRON HCL 4 MG/2ML IJ SOLN: 4.0000 mg | Freq: Four times a day (QID) | INTRAMUSCULAR | Status: DC | PRN

## 2013-03-24 MED ORDER — METOPROLOL TARTRATE 12.5 MG HALF TABLET
12.5000 mg | ORAL_TABLET | Freq: Two times a day (BID) | ORAL | Status: DC
  Administered 2013-03-25 – 2013-03-26 (×3): 12.5 mg via ORAL
  Filled 2013-03-24 (×4): qty 1

## 2013-03-24 MED ORDER — ACETAMINOPHEN 325 MG PO TABS: 650.0000 mg | ORAL_TABLET | Freq: Four times a day (QID) | ORAL | Status: DC | PRN

## 2013-03-24 MED ORDER — CITALOPRAM HYDROBROMIDE 10 MG PO TABS
10.0000 mg | ORAL_TABLET | Freq: Every day | ORAL | Status: DC
  Administered 2013-03-25 – 2013-03-27 (×3): 10 mg via ORAL
  Filled 2013-03-24 (×3): qty 1

## 2013-03-24 MED ORDER — FUROSEMIDE 20 MG PO TABS
20.0000 mg | ORAL_TABLET | Freq: Every day | ORAL | Status: DC
  Administered 2013-03-25 – 2013-03-27 (×3): 20 mg via ORAL
  Filled 2013-03-24 (×3): qty 1

## 2013-03-24 MED ORDER — HYDROCODONE-HOMATROPINE 5-1.5 MG/5ML PO SYRP: 5.0000 mL | ORAL_SOLUTION | Freq: Four times a day (QID) | ORAL | Status: DC | PRN

## 2013-03-24 MED ORDER — SODIUM CHLORIDE 0.9 % IV SOLN
1000.0000 mL | INTRAVENOUS | Status: DC
  Administered 2013-03-24 – 2013-03-25 (×3): 1000 mL via INTRAVENOUS

## 2013-03-24 MED ORDER — ONDANSETRON HCL 4 MG PO TABS: 4.0000 mg | ORAL_TABLET | Freq: Four times a day (QID) | ORAL | Status: DC | PRN

## 2013-03-24 MED ORDER — SODIUM CHLORIDE 0.9 % IV SOLN
80.0000 mg | Freq: Once | INTRAVENOUS | Status: AC
  Administered 2013-03-24: 80 mg via INTRAVENOUS
  Filled 2013-03-24 (×2): qty 80

## 2013-03-24 MED ORDER — ACETAMINOPHEN 650 MG RE SUPP: 650.0000 mg | Freq: Four times a day (QID) | RECTAL | Status: DC | PRN

## 2013-03-24 MED ORDER — ALLOPURINOL 100 MG PO TABS
100.0000 mg | ORAL_TABLET | Freq: Every day | ORAL | Status: DC
  Administered 2013-03-25 – 2013-03-27 (×3): 100 mg via ORAL
  Filled 2013-03-24 (×3): qty 1

## 2013-03-24 MED ORDER — SODIUM CHLORIDE 0.9 % IV SOLN
1000.0000 mL | Freq: Once | INTRAVENOUS | Status: AC
  Administered 2013-03-24: 1000 mL via INTRAVENOUS

## 2013-03-24 MED ORDER — PANTOPRAZOLE SODIUM 40 MG IV SOLR
40.0000 mg | INTRAVENOUS | Status: DC
  Administered 2013-03-25 – 2013-03-26 (×2): 40 mg via INTRAVENOUS
  Filled 2013-03-24 (×3): qty 40

## 2013-03-24 MED ORDER — SODIUM CHLORIDE 0.9 % IJ SOLN
3.0000 mL | Freq: Two times a day (BID) | INTRAMUSCULAR | Status: DC
  Administered 2013-03-25 – 2013-03-26 (×2): 3 mL via INTRAVENOUS

## 2013-03-24 NOTE — ED Notes (Signed)
Pt arrived from [facility] via EMS. Pt c/o 2 episodes of diarrhea. Pt also c/o increase in weakness. Denies SOB or nausea at this time. Per EMS edema in lower extremities is wnl for pt.

## 2013-03-24 NOTE — ED Notes (Signed)
Pt states her stool was dark black.

## 2013-03-24 NOTE — ED Notes (Signed)
Pt states she has been having diarrhea twice today and feels weak. No pain, no nausea, no vomiting. 140/90, HR 74 sinus rhythm. Pt from [facility]. Swelling in feet is reportedly normal.

## 2013-03-24 NOTE — Progress Notes (Signed)
Received report from ED Nurse Izora Gala.

## 2013-03-24 NOTE — H&P (Signed)
[facility] History and Physical  Beth Gutierrez VQM:[id number] DOB: 1921/12/31 DOA: 03/24/2013  Referring physician: EDP PCP: Cathlean Cower, MD   Chief Complaint: NAUSEA AND VOMITING FOR 2 DAYS.   HPI: Beth Gutierrez is a 80 y.o. female WITH cardiomyopathy, ckd stage 3, severe arthritis, in alf, came infor the above complaints, the vomitus is dark as pe rthe pt, and fecal occult was positive. He was referred to medical service for admission. She denies any other complaints.    Review of Systems:  See HPI other wise negative.  Past Medical History  Diagnosis Date  . Acute myocardial infarction, unspecified site, episode of care unspecified   . Congestive heart failure, unspecified   . Coronary atherosclerosis of unspecified type of vessel, native or graft   . Unspecified essential hypertension   . Other and unspecified hyperlipidemia   . Esophageal reflux   . Depressive disorder, not elsewhere classified   . Morbid obesity   . Need for prophylactic vaccination and inoculation against influenza   . Allergic rhinitis, cause unspecified   . Other malaise and fatigue   . Disorder of bone and cartilage, unspecified   . Diverticulosis of colon (without mention of hemorrhage)   . Benign neoplasm of colon   . Gout, unspecified   . Unspecified glaucoma   . Osteoarthrosis, unspecified whether generalized or localized, unspecified site   . Gout flare 02/06/2011  . Anemia, unspecified 04/05/2011   Past Surgical History  Procedure Laterality Date  . Coronary angioplasty with stent placement      RCA stent  . Vesicovaginal fistula closure w/ tah    . Left hip replacement      s/p 2002. Secondary to DJD  . Ectopic pregnancy surgery     Social History:  reports that she quit smoking about 22 years ago. She has never used smokeless tobacco. She reports that she does not drink alcohol or use illicit drugs.  Allergies  Allergen Reactions  . Sulfonamide Derivatives  Other (See Comments)    unknown    Family History  Problem Relation Age of Onset  . Breast cancer Sister   . Hypertension Father   . Stroke Father      Prior to Admission medications   Medication Sig Start Date End Date Taking? Authorizing Provider  acetaminophen (TYLENOL) 325 MG tablet Take 650 mg by mouth 3 (three) times daily.   Yes Historical Provider, MD  allopurinol (ZYLOPRIM) 100 MG tablet Take 1 tablet (100 mg total) by mouth daily. 06/03/12  Yes Biagio Borg, MD  aspirin (ECOTRIN LOW STRENGTH) 81 MG EC tablet Take 81 mg by mouth daily.     Yes Historical Provider, MD  atorvastatin (LIPITOR) 80 MG tablet Take 1 tablet (80 mg total) by mouth daily at 6 PM. 09/16/12  Yes Thurnell Lose, MD  cholecalciferol (VITAMIN D) 1000 UNITS tablet Take 1,000 Units by mouth daily.   Yes Historical Provider, MD  citalopram (CELEXA) 10 MG tablet Take 1 tablet (10 mg total) by mouth daily. 03/20/13  Yes Biagio Borg, MD  docusate sodium (COLACE) 100 MG capsule Take 100 mg by mouth daily.    Yes Historical Provider, MD  ferrous sulfate 325 (65 FE) MG tablet Take 325 mg by mouth 2 (two) times daily.    Yes Historical Provider, MD  furosemide (LASIX) 20 MG tablet Take 20 mg by mouth daily.   Yes Historical Provider, MD  Glucosamine 500 MG CAPS Take by mouth daily.  Yes Historical Provider, MD  HYDROcodone-homatropine (HYDROMET) 5-1.5 MG/5ML syrup Take 5 mLs by mouth every 6 (six) hours as needed for cough.   Yes Historical Provider, MD  methocarbamol (ROBAXIN) 500 MG tablet Take 500 mg by mouth 3 (three) times daily as needed for muscle spasms.    Yes Historical Provider, MD  metoprolol tartrate (LOPRESSOR) 25 MG tablet Take 12.5 mg by mouth 2 (two) times daily.   Yes Historical Provider, MD  Omega-3 Fatty Acids (FISH OIL) 1000 MG CAPS Take 2,000 mg by mouth daily.    Yes Historical Provider, MD  ondansetron (ZOFRAN) 4 MG tablet Take 1 tablet (4 mg total) by mouth every 8 (eight) hours as needed for  nausea. 08/23/12  Yes Biagio Borg, MD  traMADol (ULTRAM) 50 MG tablet Take 50 mg by mouth every 8 (eight) hours as needed (pain).   Yes Historical Provider, MD  Turmeric 450 MG CAPS Take 450 mg by mouth daily.    Yes Historical Provider, MD   Physical Exam: Filed Vitals:   03/24/13 1800  BP: 171/62  Pulse: 99  Temp:   Resp:     BP 171/62  Pulse 99  Temp(Src) 97.8 F (36.6 C) (Oral)  Resp 16  SpO2 99%  General:  Appears calm and comfortable Eyes: PERRL, normal lids, irises & conjunctiva ENT: grossly normal hearing, lips & tongue Neck: no LAD, masses or thyromegaly Cardiovascular: RRR, no m/r/g. No LE edema. Telemetry: SR, no arrhythmias  Respiratory: CTA bilaterally, no w/r/r. Normal respiratory effort. Abdomen: soft, ntnd Skin: no rash or induration seen on limited exam Musculoskeletal: grossly normal tone BUE/BLE Psychiatric: grossly normal mood and affect, speech fluent and appropriate Neurologic: grossly non-focal.          Labs on Admission:  Basic Metabolic Panel:  Recent Labs Lab 03/24/13 1514  NA 142  K 4.6  CL 104  CO2 22  GLUCOSE 80  BUN 25*  CREATININE 1.25*  CALCIUM 8.8   Liver Function Tests:  Recent Labs Lab 03/24/13 1514  AST 29  ALT 14  ALKPHOS 68  BILITOT <0.2*  PROT 6.6  ALBUMIN 3.5    Recent Labs Lab 03/24/13 1514  LIPASE 19   No results found for this basename: AMMONIA,  in the last 168 hours CBC:  Recent Labs Lab 03/20/13 1550 03/24/13 1514  WBC 8.2 7.7  NEUTROABS 5.9 5.7  HGB 9.9* 10.2*  HCT 31.2* 33.1*  MCV 92.2 94.0  PLT 325.0 237   Cardiac Enzymes: No results found for this basename: CKTOTAL, CKMB, CKMBINDEX, TROPONINI,  in the last 168 hours  BNP (last 3 results) No results found for this basename: PROBNP,  in the last 8760 hours CBG: No results found for this basename: GLUCAP,  in the last 168 hours  Radiological Exams on Admission: No results found.  EKG:  Assessment/Plan Active Problems:    Black tarry stools  Black tarry stools - admit to telemetry and evaluate for GI bleed.  - npo after midnight.  - IV protonix.  - q8hr H&H - gi consulted from Dr Paulita Fujita.  - stop aspirin.  - anemia panel ordered and pending.   2. Cardiomyopathy: - resume home medications. She appears to be compensated. Resume lasix.   3. CKDstage 3: appears to be at baseline.   4. Hypertension: controlled.   5. DVT prophylaxis   Code Status: fullcode Family Communication: discussedw ith the caregiver at bedside.  Disposition Plan: admit to telemtry.   Time spent: 42  min  [facility] Pager [phone]

## 2013-03-24 NOTE — ED Notes (Signed)
Report given to RN in pod C

## 2013-03-24 NOTE — ED Notes (Signed)
Beth Gutierrez, Blood Bank called to advise pt has a positive antibody screen; hence will take longer to cross match blood.

## 2013-03-24 NOTE — ED Provider Notes (Signed)
CSN: [id number]     Arrival date & time 03/24/13  1243 History   First MD Initiated Contact with Patient 03/24/13 1247     Chief Complaint  Patient presents with  . Diarrhea     HPI Patient reports vomiting and diarrhea for the past 2 days.  She reports her vomit his been dark in color.  She thinks it might be black.  She denies black stools.  She reports generalized weakness.  She states she was recently seen and evaluated for low hemoglobin but has not had followup with the hematologist yet.  She is on an aspirin daily but no other anticoagulants.  Denies abdominal pain.  No nausea at this time.  Last bowel movement was earlier today and described as green and watery   Past Medical History  Diagnosis Date  . Acute myocardial infarction, unspecified site, episode of care unspecified   . Congestive heart failure, unspecified   . Coronary atherosclerosis of unspecified type of vessel, native or graft   . Unspecified essential hypertension   . Other and unspecified hyperlipidemia   . Esophageal reflux   . Depressive disorder, not elsewhere classified   . Morbid obesity   . Need for prophylactic vaccination and inoculation against influenza   . Allergic rhinitis, cause unspecified   . Other malaise and fatigue   . Disorder of bone and cartilage, unspecified   . Diverticulosis of colon (without mention of hemorrhage)   . Benign neoplasm of colon   . Gout, unspecified   . Unspecified glaucoma   . Osteoarthrosis, unspecified whether generalized or localized, unspecified site   . Gout flare 02/06/2011  . Anemia, unspecified 04/05/2011   Past Surgical History  Procedure Laterality Date  . Coronary angioplasty with stent placement      RCA stent  . Vesicovaginal fistula closure w/ tah    . Left hip replacement      s/p 2002. Secondary to DJD  . Ectopic pregnancy surgery     Family History  Problem Relation Age of Onset  . Breast cancer Sister   . Hypertension Father   . Stroke  Father    History  Substance Use Topics  . Smoking status: Former Smoker    Quit date: 05/30/1990  . Smokeless tobacco: Never Used  . Alcohol Use: No   OB History   Grav Para Term Preterm Abortions TAB SAB Ect Mult Living                 Review of Systems  All other systems reviewed and are negative.      Allergies  Sulfonamide derivatives  Home Medications   Current Outpatient Rx  Name  Route  Sig  Dispense  Refill  . acetaminophen (TYLENOL) 325 MG tablet   Oral   Take 650 mg by mouth 3 (three) times daily.         Marland Kitchen allopurinol (ZYLOPRIM) 100 MG tablet   Oral   Take 1 tablet (100 mg total) by mouth daily.   90 tablet   3   . aspirin (ECOTRIN LOW STRENGTH) 81 MG EC tablet   Oral   Take 81 mg by mouth daily.           Marland Kitchen atorvastatin (LIPITOR) 80 MG tablet   Oral   Take 1 tablet (80 mg total) by mouth daily at 6 PM.         . cholecalciferol (VITAMIN D) 1000 UNITS tablet   Oral   Take  1,000 Units by mouth daily.         . citalopram (CELEXA) 10 MG tablet   Oral   Take 1 tablet (10 mg total) by mouth daily.   90 tablet   3   . docusate sodium (COLACE) 100 MG capsule   Oral   Take 100 mg by mouth daily.          . ferrous sulfate 325 (65 FE) MG tablet   Oral   Take 325 mg by mouth 2 (two) times daily.          . furosemide (LASIX) 20 MG tablet   Oral   Take 20 mg by mouth daily.         . Glucosamine 500 MG CAPS   Oral   Take by mouth daily.         Marland Kitchen HYDROcodone-homatropine (HYDROMET) 5-1.5 MG/5ML syrup   Oral   Take 5 mLs by mouth every 6 (six) hours as needed for cough.         . methocarbamol (ROBAXIN) 500 MG tablet   Oral   Take 500 mg by mouth 3 (three) times daily as needed for muscle spasms.          . metoprolol tartrate (LOPRESSOR) 25 MG tablet   Oral   Take 12.5 mg by mouth 2 (two) times daily.         . Omega-3 Fatty Acids (FISH OIL) 1000 MG CAPS   Oral   Take 2,000 mg by mouth daily.          .  ondansetron (ZOFRAN) 4 MG tablet   Oral   Take 1 tablet (4 mg total) by mouth every 8 (eight) hours as needed for nausea.   30 tablet   4   . traMADol (ULTRAM) 50 MG tablet   Oral   Take 50 mg by mouth every 8 (eight) hours as needed (pain).         . Turmeric 450 MG CAPS   Oral   Take 450 mg by mouth daily.           BP 140/48  Pulse 76  Temp(Src) 97.8 F (36.6 C) (Oral)  SpO2 97% Physical Exam  Nursing note and vitals reviewed. Constitutional: She is oriented to person, place, and time. She appears well-developed and well-nourished. No distress.  HENT:  Head: Normocephalic and atraumatic.  Eyes: EOM are normal.  Neck: Normal range of motion.  Cardiovascular: Normal rate, regular rhythm and normal heart sounds.   Pulmonary/Chest: Effort normal and breath sounds normal.  Abdominal: Soft. She exhibits no distension. There is no tenderness.  Musculoskeletal: Normal range of motion.  Neurological: She is alert and oriented to person, place, and time.  Skin: Skin is warm and dry.  Psychiatric: She has a normal mood and affect. Judgment normal.    ED Course  Procedures (including critical care time) Labs Review Labs Reviewed  URINALYSIS, ROUTINE W REFLEX MICROSCOPIC - Abnormal; Notable for the following:    Hgb urine dipstick TRACE (*)    Protein, ur 30 (*)    All other components within normal limits  URINE MICROSCOPIC-ADD ON - Abnormal; Notable for the following:    Squamous Epithelial / LPF FEW (*)    Bacteria, UA FEW (*)    Casts HYALINE CASTS (*)    All other components within normal limits  CBC WITH DIFFERENTIAL - Abnormal; Notable for the following:    RBC 3.52 (*)  Hemoglobin 10.2 (*)    HCT 33.1 (*)    RDW 25.0 (*)    All other components within normal limits  COMPREHENSIVE METABOLIC PANEL - Abnormal; Notable for the following:    BUN 25 (*)    Creatinine, Ser 1.25 (*)    Total Bilirubin <0.2 (*)    GFR calc non Af Amer 37 (*)    GFR calc Af Amer  43 (*)    All other components within normal limits  RETICULOCYTES - Abnormal; Notable for the following:    RBC. 3.52 (*)    All other components within normal limits  OCCULT BLOOD, POC DEVICE - Abnormal; Notable for the following:    Fecal Occult Bld POSITIVE (*)    All other components within normal limits  LIPASE, BLOOD  VITAMIN B12  FOLATE  IRON AND TIBC  FERRITIN  TYPE AND SCREEN   Imaging Review No results found.  EKG Interpretation   None       MDM   Final diagnoses:  None    Patient complains of symptoms that could represent upper GI bleed.  Some clear.  Hemoccult positive.  Hemoglobin stable at this time.  Vital signs stable.  Observation admission for serial CBCs.    Hoy Morn, MD 03/24/13 [phone]

## 2013-03-25 DIAGNOSIS — K921 Melena: Secondary | ICD-10-CM | POA: Diagnosis present

## 2013-03-25 DIAGNOSIS — R1013 Epigastric pain: Secondary | ICD-10-CM

## 2013-03-25 DIAGNOSIS — D509 Iron deficiency anemia, unspecified: Secondary | ICD-10-CM

## 2013-03-25 DIAGNOSIS — R634 Abnormal weight loss: Secondary | ICD-10-CM

## 2013-03-25 DIAGNOSIS — K920 Hematemesis: Secondary | ICD-10-CM

## 2013-03-25 DIAGNOSIS — D126 Benign neoplasm of colon, unspecified: Secondary | ICD-10-CM

## 2013-03-25 LAB — MRSA PCR SCREENING: MRSA BY PCR: NEGATIVE

## 2013-03-25 LAB — IRON AND TIBC
Iron: 49 ug/dL (ref 42–135)
Saturation Ratios: 18 % — ABNORMAL LOW (ref 20–55)
TIBC: 277 ug/dL (ref 250–470)
UIBC: 228 ug/dL (ref 125–400)

## 2013-03-25 LAB — CBC
HCT: 32 % — ABNORMAL LOW (ref 36.0–46.0)
HEMATOCRIT: 29.3 % — AB (ref 36.0–46.0)
HEMOGLOBIN: 8.8 g/dL — AB (ref 12.0–15.0)
HEMOGLOBIN: 9.5 g/dL — AB (ref 12.0–15.0)
MCH: 28.5 pg (ref 26.0–34.0)
MCH: 28.2 pg (ref 26.0–34.0)
MCHC: 30 g/dL (ref 30.0–36.0)
MCHC: 29.7 g/dL — AB (ref 30.0–36.0)
MCV: 94.8 fL (ref 78.0–100.0)
MCV: 95 fL (ref 78.0–100.0)
Platelets: 236 10*3/uL (ref 150–400)
Platelets: 227 10*3/uL (ref 150–400)
RBC: 3.09 MIL/uL — AB (ref 3.87–5.11)
RBC: 3.37 MIL/uL — ABNORMAL LOW (ref 3.87–5.11)
RDW: 24.7 % — ABNORMAL HIGH (ref 11.5–15.5)
RDW: 24.1 % — ABNORMAL HIGH (ref 11.5–15.5)
WBC: 7.9 10*3/uL (ref 4.0–10.5)
WBC: 8.8 10*3/uL (ref 4.0–10.5)

## 2013-03-25 LAB — BASIC METABOLIC PANEL
BUN: 19 mg/dL (ref 6–23)
CHLORIDE: 110 meq/L (ref 96–112)
CO2: 23 meq/L (ref 19–32)
Calcium: 8 mg/dL — ABNORMAL LOW (ref 8.4–10.5)
Creatinine, Ser: 1.14 mg/dL — ABNORMAL HIGH (ref 0.50–1.10)
GFR calc Af Amer: 48 mL/min — ABNORMAL LOW (ref >90)
GFR calc non Af Amer: 41 mL/min — ABNORMAL LOW (ref >90)
GLUCOSE: 77 mg/dL (ref 70–99)
POTASSIUM: 4 meq/L (ref 3.7–5.3)
Sodium: 147 mEq/L (ref 137–147)

## 2013-03-25 LAB — VITAMIN B12: VITAMIN B 12: 238 pg/mL (ref 211–911)

## 2013-03-25 LAB — FOLATE: Folate: 12 ng/mL

## 2013-03-25 LAB — FERRITIN: Ferritin: 62 ng/mL (ref 10–291)

## 2013-03-25 MED ORDER — SODIUM CHLORIDE 0.9 % IV SOLN
INTRAVENOUS | Status: DC
  Administered 2013-03-25: 15:00:00 via INTRAVENOUS

## 2013-03-25 NOTE — Progress Notes (Signed)
We were asked to see this patient with reference to her GI bleed, but on chart review, she is a primary patient of Dr. Cathlean Cower of [facility], and she has been scoped in the remote past by Dr. Henrene Pastor of that group.  I have spoke with Janett Billow, the [facility] [place], and at this time it is likely that they will opt to go ahead and provide the GI care for this patient.  However, if their attending gastroenterologist prefers for me to see the patient, I will be happy to do so.  Cleotis Nipper, M.D. [phone]
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
## 2013-03-25 NOTE — Consult Note (Addendum)
Referring Provider: No ref. provider found Primary Care Physician:  Cathlean Cower, MD Primary Gastroenterologist:  Dr. Henrene Pastor  Reason for Consultation:  GIB; melena  HPI: Beth Gutierrez is a 80 y.o. female with PMH of cardiomyopathy, ckd stage 3, severe arthritis, CAD with stent placement in the past who lives at an independent living facility.  She presented to [facility] hospital on 2/9 with complaints of two large episodes of black stool within the past two weeks.  She says that the first time that it occurred she did not tell anyone because the stool returned to normal the next day.  Then she had a second episode this past weekend.  Prior to that episode she also had an episode of "black" emesis.  No BM or vomiting since admission.  Was heme positive.  She has been anemic for a while, but previously stayed in the 10 gram range; over the past 6 months her Hgb has been declining and is now in the 8-9 gram range.  She was started on iron supplements in the middle of January.  Iron studies are near normal, however.  B12 and folate are also normal.  She denies all other complaints and says that she's never had any similar black BM's in the past.  Occasionally sees red blood streaks, however.  Says that her appetite has not been great and she has lost 20 pounds within the past several months due to poor appetite.  Denies abdominal pain, dysphagia, reflux, and regular NSAID use.  Was on ASA 81 mg, but that is being held.  Hgb was 10.2 grams on admission and is 8.8 grams this AM.  She is on protonix 40 mg IV daily here in the hospital.  Has history of colon polyps (adenomas) on colonoscopy with Dr. Henrene Pastor in 2002 and 2005.    Past Medical History  Diagnosis Date  . Acute myocardial infarction, unspecified site, episode of care unspecified   . Congestive heart failure, unspecified   . Coronary atherosclerosis of unspecified type of vessel, native or graft   . Unspecified essential hypertension   . Other  and unspecified hyperlipidemia   . Esophageal reflux   . Depressive disorder, not elsewhere classified   . Morbid obesity   . Need for prophylactic vaccination and inoculation against influenza   . Allergic rhinitis, cause unspecified   . Other malaise and fatigue   . Disorder of bone and cartilage, unspecified   . Diverticulosis of colon (without mention of hemorrhage)   . Benign neoplasm of colon   . Gout, unspecified   . Unspecified glaucoma   . Osteoarthrosis, unspecified whether generalized or localized, unspecified site   . Gout flare 02/06/2011  . Anemia, unspecified 04/05/2011  . Pneumonia   . [id number])     Past Surgical History  Procedure Laterality Date  . Coronary angioplasty with stent placement      RCA stent  . Vesicovaginal fistula closure w/ tah    . Left hip replacement      s/p 2002. Secondary to DJD  . Ectopic pregnancy surgery      Prior to Admission medications   Medication Sig Start Date End Date Taking? Authorizing Provider  acetaminophen (TYLENOL) 325 MG tablet Take 650 mg by mouth 3 (three) times daily.   Yes Historical Provider, MD  allopurinol (ZYLOPRIM) 100 MG tablet Take 1 tablet (100 mg total) by mouth daily. 06/03/12  Yes Biagio Borg, MD  aspirin (ECOTRIN LOW STRENGTH) 81 MG EC tablet Take  81 mg by mouth daily.     Yes Historical Provider, MD  atorvastatin (LIPITOR) 80 MG tablet Take 1 tablet (80 mg total) by mouth daily at 6 PM. 09/16/12  Yes Thurnell Lose, MD  cholecalciferol (VITAMIN D) 1000 UNITS tablet Take 1,000 Units by mouth daily.   Yes Historical Provider, MD  citalopram (CELEXA) 10 MG tablet Take 1 tablet (10 mg total) by mouth daily. 03/20/13  Yes Biagio Borg, MD  docusate sodium (COLACE) 100 MG capsule Take 100 mg by mouth daily.    Yes Historical Provider, MD  ferrous sulfate 325 (65 FE) MG tablet Take 325 mg by mouth 2 (two) times daily.    Yes Historical Provider, MD  furosemide (LASIX) 20 MG tablet Take 20 mg by mouth daily.    Yes Historical Provider, MD  Glucosamine 500 MG CAPS Take by mouth daily.   Yes Historical Provider, MD  HYDROcodone-homatropine (HYDROMET) 5-1.5 MG/5ML syrup Take 5 mLs by mouth every 6 (six) hours as needed for cough.   Yes Historical Provider, MD  methocarbamol (ROBAXIN) 500 MG tablet Take 500 mg by mouth 3 (three) times daily as needed for muscle spasms.    Yes Historical Provider, MD  metoprolol tartrate (LOPRESSOR) 25 MG tablet Take 12.5 mg by mouth 2 (two) times daily.   Yes Historical Provider, MD  Omega-3 Fatty Acids (FISH OIL) 1000 MG CAPS Take 2,000 mg by mouth daily.    Yes Historical Provider, MD  ondansetron (ZOFRAN) 4 MG tablet Take 1 tablet (4 mg total) by mouth every 8 (eight) hours as needed for nausea. 08/23/12  Yes Biagio Borg, MD  traMADol (ULTRAM) 50 MG tablet Take 50 mg by mouth every 8 (eight) hours as needed (pain).   Yes Historical Provider, MD  Turmeric 450 MG CAPS Take 450 mg by mouth daily.    Yes Historical Provider, MD    Current Facility-Administered Medications  Medication Dose Route Frequency Provider Last Rate Last Dose  . 0.9 %  sodium chloride infusion  1,000 mL Intravenous Continuous Hoy Morn, MD 125 mL/hr at 03/25/13 1042 1,000 mL at 03/25/13 1042  . acetaminophen (TYLENOL) tablet 650 mg  650 mg Oral Q6H PRN Hosie Poisson, MD       Or  . acetaminophen (TYLENOL) suppository 650 mg  650 mg Rectal Q6H PRN Hosie Poisson, MD      . allopurinol (ZYLOPRIM) tablet 100 mg  100 mg Oral Daily Hosie Poisson, MD   100 mg at 03/25/13 0951  . citalopram (CELEXA) tablet 10 mg  10 mg Oral Daily Hosie Poisson, MD   10 mg at 03/25/13 0951  . furosemide (LASIX) tablet 20 mg  20 mg Oral Daily Hosie Poisson, MD   20 mg at 03/25/13 0951  . HYDROcodone-homatropine (HYCODAN) 5-1.5 MG/5ML syrup 5 mL  5 mL Oral Q6H PRN Hosie Poisson, MD      . metoprolol tartrate (LOPRESSOR) tablet 12.5 mg  12.5 mg Oral BID Hosie Poisson, MD   12.5 mg at 03/25/13 0951  . ondansetron (ZOFRAN) tablet  4 mg  4 mg Oral Q6H PRN Hosie Poisson, MD       Or  . ondansetron (ZOFRAN) injection 4 mg  4 mg Intravenous Q6H PRN Hosie Poisson, MD      . pantoprazole (PROTONIX) injection 40 mg  40 mg Intravenous Q24H Hosie Poisson, MD   40 mg at 03/25/13 0539  . sodium chloride 0.9 % injection 3 mL  3 mL Intravenous  Q12H Hosie Poisson, MD   3 mL at 03/25/13 0954    Allergies as of 03/24/2013 - Review Complete 03/24/2013  Allergen Reaction Noted  . Sulfonamide derivatives Other (See Comments) 10/04/2006    Family History  Problem Relation Age of Onset  . Breast cancer Sister   . Hypertension Father   . Stroke Father     History   Social History  . Marital Status: Widowed    Spouse Name: N/A    Number of Children: 2  . Years of Education: N/A   Occupational History  . retired [employer]    Social History Main Topics  . Smoking status: Former Smoker    Quit date: 05/30/1990  . Smokeless tobacco: Never Used  . Alcohol Use: No  . Drug Use: No  . Sexual Activity: No   Other Topics Concern  . Not on file   Social History Narrative   Lives alone    Review of Systems: Ten point ROS is O/W negative except as mentioned in HPI.  Physical Exam: Vital signs in last 24 hours: Temp:  [97.6 F (36.4 C)-98.1 F (36.7 C)] 98.1 F (36.7 C) (02/10 0520) Pulse Rate:  [71-99] 71 (02/10 0520) Resp:  [16-20] 20 (02/10 0520) BP: (147-171)/(58-123) 150/75 mmHg (02/10 0520) SpO2:  [96 %-100 %] 100 % (02/10 0520) Weight:  [245 lb 13 oz (111.5 kg)] 245 lb 13 oz (111.5 kg) (02/09 2049) Last BM Date: 03/24/13 General:  Alert, Well-developed, well-nourished, pleasant and cooperative in NAD Head:  Normocephalic and atraumatic. Eyes:  Sclera clear, no icterus.  Conjunctiva pink. Ears:  Normal auditory acuity. Mouth:  No deformity or lesions. Lungs:  Clear throughout to auscultation.  No wheezes, crackles, or rhonchi.  Heart:  Regular rate and rhythm; no murmurs, clicks, rubs,  or  gallops. Abdomen:  Soft, non-distended.  BS present.  Non-tender.   Rectal:  Deferred.  Heme positive in ED.  Msk:  Symmetrical without gross deformities. Pulses:  Normal pulses noted. Extremities:  Without clubbing or edema. Neurologic:  Alert and  oriented x4;  grossly normal neurologically. Skin:  Intact without significant lesions or rashes. Psych:  Alert and cooperative. Normal mood and affect.  Lab Results:  Recent Labs  03/24/13 1514 03/24/13 2200 03/25/13 0514  WBC 7.7  --  7.9  HGB 10.2* 9.5* 8.8*  HCT 33.1* 31.1* 29.3*  PLT 237  --  236   BMET  Recent Labs  03/24/13 1514 03/25/13 0514  NA 142 147  K 4.6 4.0  CL 104 110  CO2 22 23  GLUCOSE 80 77  BUN 25* 19  CREATININE 1.25* 1.14*  CALCIUM 8.8 8.0*   LFT  Recent Labs  03/24/13 1514  PROT 6.6  ALBUMIN 3.5  AST 29  ALT 14  ALKPHOS 68  BILITOT <0.2*   Studies/Results: Dg Chest 2 View  03/24/2013   CLINICAL DATA:  Shortness of breath  EXAM: CHEST  2 VIEW  COMPARISON:  DG CHEST 1 VIEW dated 09/10/2012  FINDINGS: The cardiac silhouette is enlarged. There is prominence at indistinctness of the pulmonary vasculature and interstitial markings. Areas of peribronchial cuffing are also identified. No focal regions of consolidation or focal infiltrates are appreciated. Blunting of the costophrenic angles is appreciated. The osseous structures demonstrate chronic osteoarthritic changes within the left shoulder.  IMPRESSION: Interstitial infiltrate likely reflecting pulmonary edema. Small bilateral effusions. Surveillance evaluation recommended.   Electronically Signed   By: Margaree Mackintosh M.D.   On: 03/24/2013  19:26    IMPRESSION:  -Melena x 2 in the past two weeks with heme positive stool.  Also had CGE prior to arrival as well.  Likely UGIB.  Rule out ulcer disease vs AVM vs malignancy, etc. -Acute on chronic anemia:  On iron supplements just since mid-January.  Hgb down 1.5 grams since admission. -History of colon  polyps in 2002 (tubular adenomas) and 2005 (cannot find path report).  PLAN: -Monitor Hgb and transfuse if needed. -EGD tomorrow.  Can have clear liquids today then NPO after midnight. -Continue PPI 40 mg IV daily for now.   ZEHR, JESSICA D.  03/25/2013, 1:41 PM  Pager number [phone]   [facility] Attending  I have also seen and assessed the patient and agree with the above note. Very sweet elderly woman with hx of melena and coffee ground emesis with acute blood loss anemia. Also has hx weight loss. EGD tomorrow makes sense to evaluate for cause and guide Tx.  The risks and benefits as well as alternatives of endoscopic procedure(s) have been discussed and reviewed. All questions answered. The patient agrees to proceed.  Gatha Mayer, MD, [facility] [phone] (pager) 03/25/2013 4:40 PM

## 2013-03-25 NOTE — Progress Notes (Signed)
[facility] PROGRESS NOTE   Beth Gutierrez BZJ:[id number] DOB: January 26, 1922 DOA: 03/24/2013 PCP: Cathlean Cower, MD  HPI/Subjective: No nausea vomiting since yesterday.  Assessment/Plan: Active Problems:   GOUT   HYPERTENSION   Chronic systolic heart failure   CKD (chronic kidney disease) stage 2, GFR 60-89 ml/min   Black tarry stools   Melena -Presented with melanotic stools, admitted to telemetry for evaluation of GI bleed. -N.p.o. after midnight, IV Protonix. -CBC every 8 hours, check anemia panel, transfuse if hemoglobin less than 8. -Gastroenterology to evaluate.  Anemia -Acute blood loss anemia on top of chronic anemia. -Patient has history of anemia since last year, could be secondary to chronic kidney disease.  Cardiomyopathy -Compensated without symptoms or signs of fluid overload. -Resume home medications, on Lasix that's resumed.  CKD stage III -Appears to be at baseline, follow BMP closely while she is in the hospital  Code Status: Full code Family Communication: Plan discussed with the patient. Disposition Plan: Remains inpatient   Consultants:  Gastroenterology  Procedures:  None  Antibiotics:  None   Objective: Filed Vitals:   03/25/13 0520  BP: 150/75  Pulse: 71  Temp: 98.1 F (36.7 C)  Resp: 20    Intake/Output Summary (Last 24 hours) at 03/25/13 1019 Last data filed at 03/25/13 0900  Gross per 24 hour  Intake      0 ml  Output      0 ml  Net      0 ml   Filed Weights   03/24/13 2049  Weight: 111.5 kg (245 lb 13 oz)    Exam: General: Alert and awake, oriented x3, not in any acute distress. HEENT: anicteric sclera, pupils reactive to light and accommodation, EOMI CVS: S1-S2 clear, no murmur rubs or gallops Chest: clear to auscultation bilaterally, no wheezing, rales or rhonchi Abdomen: soft nontender, nondistended, normal bowel sounds, no organomegaly Extremities: no cyanosis, clubbing or edema noted  bilaterally Neuro: Cranial nerves II-XII intact, no focal neurological deficits  Data Reviewed: Basic Metabolic Panel:  Recent Labs Lab 03/24/13 1514 03/25/13 0514  NA 142 147  K 4.6 4.0  CL 104 110  CO2 22 23  GLUCOSE 80 77  BUN 25* 19  CREATININE 1.25* 1.14*  CALCIUM 8.8 8.0*   Liver Function Tests:  Recent Labs Lab 03/24/13 1514  AST 29  ALT 14  ALKPHOS 68  BILITOT <0.2*  PROT 6.6  ALBUMIN 3.5    Recent Labs Lab 03/24/13 1514  LIPASE 19   No results found for this basename: AMMONIA,  in the last 168 hours CBC:  Recent Labs Lab 03/20/13 1550 03/24/13 1514 03/24/13 2200 03/25/13 0514  WBC 8.2 7.7  --  7.9  NEUTROABS 5.9 5.7  --   --   HGB 9.9* 10.2* 9.5* 8.8*  HCT 31.2* 33.1* 31.1* 29.3*  MCV 92.2 94.0  --  94.8  PLT 325.0 237  --  236   Cardiac Enzymes: No results found for this basename: CKTOTAL, CKMB, CKMBINDEX, TROPONINI,  in the last 168 hours BNP (last 3 results) No results found for this basename: PROBNP,  in the last 8760 hours CBG: No results found for this basename: GLUCAP,  in the last 168 hours  Micro Recent Results (from the past 240 hour(s))  MRSA PCR SCREENING     Status: None   Collection Time    03/25/13  8:38 AM      Result Value Range Status   MRSA by PCR NEGATIVE  NEGATIVE  Final   Comment:            The GeneXpert MRSA Assay (FDA     approved for NASAL specimens     only), is one component of a     comprehensive MRSA colonization     surveillance program. It is not     intended to diagnose MRSA     infection nor to guide or     monitor treatment for     MRSA infections.     Studies: Dg Chest 2 View  03/24/2013   CLINICAL DATA:  Shortness of breath  EXAM: CHEST  2 VIEW  COMPARISON:  DG CHEST 1 VIEW dated 09/10/2012  FINDINGS: The cardiac silhouette is enlarged. There is prominence at indistinctness of the pulmonary vasculature and interstitial markings. Areas of peribronchial cuffing are also identified. No focal  regions of consolidation or focal infiltrates are appreciated. Blunting of the costophrenic angles is appreciated. The osseous structures demonstrate chronic osteoarthritic changes within the left shoulder.  IMPRESSION: Interstitial infiltrate likely reflecting pulmonary edema. Small bilateral effusions. Surveillance evaluation recommended.   Electronically Signed   By: Margaree Mackintosh M.D.   On: 03/24/2013 19:26    Scheduled Meds: . allopurinol  100 mg Oral Daily  . citalopram  10 mg Oral Daily  . furosemide  20 mg Oral Daily  . metoprolol tartrate  12.5 mg Oral BID  . pantoprazole (PROTONIX) IV  40 mg Intravenous Q24H  . sodium chloride  3 mL Intravenous Q12H   Continuous Infusions: . sodium chloride 1,000 mL (03/25/13 0219)       Time spent: 35 minutes    [facility]  [facility] Pager [phone] If 7PM-7AM, please contact night-coverage at [email or web address], password [facility] 03/25/2013, 10:19 AM  LOS: 1 day

## 2013-03-26 ENCOUNTER — Encounter (HOSPITAL_COMMUNITY)
Admission: EM | Disposition: A | Discharge status: Skilled Nursing Facility | Payer: Self-pay | Source: Home / Self Care | Attending: Internal Medicine

## 2013-03-26 ENCOUNTER — Encounter (HOSPITAL_COMMUNITY): Payer: Self-pay | Admitting: *Deleted

## 2013-03-26 DIAGNOSIS — E785 Hyperlipidemia, unspecified: Secondary | ICD-10-CM

## 2013-03-26 DIAGNOSIS — K2961 Other gastritis with bleeding: Principal | ICD-10-CM

## 2013-03-26 DIAGNOSIS — K260 Acute duodenal ulcer with hemorrhage: Secondary | ICD-10-CM

## 2013-03-26 DIAGNOSIS — I5022 Chronic systolic (congestive) heart failure: Secondary | ICD-10-CM

## 2013-03-26 DIAGNOSIS — K921 Melena: Secondary | ICD-10-CM

## 2013-03-26 DIAGNOSIS — N182 Chronic kidney disease, stage 2 (mild): Secondary | ICD-10-CM

## 2013-03-26 HISTORY — DX: Acute duodenal ulcer with hemorrhage: K26.0

## 2013-03-26 HISTORY — PX: ESOPHAGOGASTRODUODENOSCOPY: SHX5428

## 2013-03-26 LAB — CBC
HEMATOCRIT: 30.2 % — AB (ref 36.0–46.0)
HEMATOCRIT: 30 % — AB (ref 36.0–46.0)
HEMOGLOBIN: 9.2 g/dL — AB (ref 12.0–15.0)
Hemoglobin: 9 g/dL — ABNORMAL LOW (ref 12.0–15.0)
MCH: 28.4 pg (ref 26.0–34.0)
MCH: 29.2 pg (ref 26.0–34.0)
MCHC: 29.8 g/dL — ABNORMAL LOW (ref 30.0–36.0)
MCHC: 30.7 g/dL (ref 30.0–36.0)
MCV: 95.3 fL (ref 78.0–100.0)
MCV: 95.2 fL (ref 78.0–100.0)
Platelets: 222 10*3/uL (ref 150–400)
Platelets: 220 10*3/uL (ref 150–400)
RBC: 3.17 MIL/uL — AB (ref 3.87–5.11)
RBC: 3.15 MIL/uL — ABNORMAL LOW (ref 3.87–5.11)
RDW: 24.1 % — ABNORMAL HIGH (ref 11.5–15.5)
RDW: 23.5 % — ABNORMAL HIGH (ref 11.5–15.5)
WBC: 8.1 10*3/uL (ref 4.0–10.5)
WBC: 7.4 10*3/uL (ref 4.0–10.5)

## 2013-03-26 LAB — BASIC METABOLIC PANEL
BUN: 19 mg/dL (ref 6–23)
CO2: 23 mEq/L (ref 19–32)
Calcium: 8.1 mg/dL — ABNORMAL LOW (ref 8.4–10.5)
Chloride: 111 mEq/L (ref 96–112)
Creatinine, Ser: 1.16 mg/dL — ABNORMAL HIGH (ref 0.50–1.10)
GFR calc Af Amer: 47 mL/min — ABNORMAL LOW (ref >90)
GFR, EST NON AFRICAN AMERICAN: 40 mL/min — AB (ref >90)
GLUCOSE: 76 mg/dL (ref 70–99)
POTASSIUM: 4 meq/L (ref 3.7–5.3)
Sodium: 146 mEq/L (ref 137–147)

## 2013-03-26 SURGERY — EGD (ESOPHAGOGASTRODUODENOSCOPY)
Anesthesia: Moderate Sedation

## 2013-03-26 MED ORDER — METOPROLOL TARTRATE 25 MG PO TABS
25.0000 mg | ORAL_TABLET | Freq: Two times a day (BID) | ORAL | Status: DC
  Administered 2013-03-26 – 2013-03-27 (×2): 25 mg via ORAL
  Filled 2013-03-26 (×3): qty 1

## 2013-03-26 MED ORDER — ATORVASTATIN CALCIUM 80 MG PO TABS
80.0000 mg | ORAL_TABLET | Freq: Every day | ORAL | Status: DC
  Administered 2013-03-26: 80 mg via ORAL
  Filled 2013-03-26 (×3): qty 1

## 2013-03-26 MED ORDER — HYDRALAZINE HCL 20 MG/ML IJ SOLN: 10.0000 mg | Freq: Four times a day (QID) | INTRAMUSCULAR | Status: DC | PRN

## 2013-03-26 MED ORDER — MIDAZOLAM HCL 5 MG/ML IJ SOLN
INTRAMUSCULAR | Status: AC
  Filled 2013-03-26: qty 2

## 2013-03-26 MED ORDER — SODIUM CHLORIDE 0.9 % IV SOLN: 1000.0000 mL | INTRAVENOUS | Status: DC

## 2013-03-26 MED ORDER — DIPHENHYDRAMINE HCL 50 MG/ML IJ SOLN
INTRAMUSCULAR | Status: AC
  Filled 2013-03-26: qty 1

## 2013-03-26 MED ORDER — FENTANYL CITRATE 0.05 MG/ML IJ SOLN
INTRAMUSCULAR | Status: AC
  Filled 2013-03-26: qty 2

## 2013-03-26 MED ORDER — SODIUM CHLORIDE 0.9 % IV SOLN
INTRAVENOUS | Status: DC
  Administered 2013-03-26: 500 mL via INTRAVENOUS

## 2013-03-26 MED ORDER — MIDAZOLAM HCL 10 MG/2ML IJ SOLN
INTRAMUSCULAR | Status: DC | PRN
  Administered 2013-03-26 (×2): 1 mg via INTRAVENOUS

## 2013-03-26 MED ORDER — BUTAMBEN-TETRACAINE-BENZOCAINE 2-2-14 % EX AERO
INHALATION_SPRAY | CUTANEOUS | Status: DC | PRN
  Administered 2013-03-26: 2 via TOPICAL

## 2013-03-26 MED ORDER — FENTANYL CITRATE 0.05 MG/ML IJ SOLN
INTRAMUSCULAR | Status: DC | PRN
  Administered 2013-03-26: 25 ug via INTRAVENOUS

## 2013-03-26 MED ORDER — PANTOPRAZOLE SODIUM 40 MG PO TBEC
40.0000 mg | DELAYED_RELEASE_TABLET | Freq: Every day | ORAL | Status: DC
  Administered 2013-03-27: 40 mg via ORAL
  Filled 2013-03-26: qty 1

## 2013-03-26 NOTE — Progress Notes (Signed)
PATIENT DETAILS Name: Beth Gutierrez Age: 80 y.o. Sex: female Date of Birth: 05-25-1921 Admit Date: 03/24/2013 Admitting Physician Hosie Poisson, MD VEL:[id number] Jenny Reichmann, MD  Subjective: No major issues overnight  Assessment/Plan: Melena  -Presented with melanotic stools, admitted to telemetry for evaluation of GI bleed. Thankfully, no melanotic stool since admission. - Seen by gastroenterology, EGD on 2/12 - Continue PPI  Anemia - Suspected mild acute blood loss anemia on top of chronic anemia - Monitor hemoglobin and hematocrit periodically  Chronic systolic heart failure - Last ejection fraction 07/14 25-30% - Clinically compensated - Stop IV fluids, continue with Lasix. Daily weights - Continue beta blockers, not a candidate for ACE given chronic kidney disease  Chronic kidney disease stage III - Creatinine posterior usual baseline, monitor periodically  History of coronary artery disease - Off aspirin given GI bleed - Resume statins, continue with low-dose beta blockers  Dyslipidemia - Resume Lipitor  Disposition: Remain inpatient  DVT Prophylaxis: SCD's  Code Status: Full code   Family Communication None  Procedures:  None  CONSULTS:  GI  Time spent 40 minutes-which includes 50% of the time with face-to-face with patient/ family and coordinating care related to the above assessment and plan.    MEDICATIONS: Scheduled Meds: . allopurinol  100 mg Oral Daily  . citalopram  10 mg Oral Daily  . furosemide  20 mg Oral Daily  . metoprolol tartrate  12.5 mg Oral BID  . pantoprazole (PROTONIX) IV  40 mg Intravenous Q24H  . sodium chloride  3 mL Intravenous Q12H   Continuous Infusions: . sodium chloride 1,000 mL (03/25/13 1042)  . sodium chloride 20 mL/hr at 03/25/13 1527   PRN Meds:.acetaminophen, acetaminophen, HYDROcodone-homatropine, ondansetron (ZOFRAN) IV, ondansetron  Antibiotics: Anti-infectives   None       PHYSICAL  EXAM: Vital signs in last 24 hours: Filed Vitals:   03/25/13 1300 03/25/13 2159 03/26/13 0543 03/26/13 1002  BP: 164/60 149/63 155/65 158/64  Pulse: 76 79 67 60  Temp:  98.2 F (36.8 C) 98.4 F (36.9 C)   TempSrc:   Oral   Resp: 20 20 18    Height:      Weight:      SpO2: 99% 99% 99%     Weight change:  Filed Weights   03/24/13 2049  Weight: 111.5 kg (245 lb 13 oz)   Body mass index is 44.95 kg/(m^2).   Gen Exam: Awake and alert with clear speech.   Neck: Supple, No JVD.   Chest: B/L Clear.   CVS: S1 S2 Regular, no murmurs.  Abdomen: soft, BS +, non tender, non distended.  Extremities: no edema, lower extremities warm to touch Neurologic: Non Focal.   Skin: No Rash.   Wounds: N/A.   Intake/Output from previous day:  Intake/Output Summary (Last 24 hours) at 03/26/13 1122 Last data filed at 03/26/13 0900  Gross per 24 hour  Intake      0 ml  Output    450 ml  Net   -450 ml     LAB RESULTS: CBC  Recent Labs Lab 03/20/13 1550 03/24/13 1514 03/24/13 2200 03/25/13 0514 03/25/13 1710 03/26/13 0522  WBC 8.2 7.7  --  7.9 8.8 8.1  HGB 9.9* 10.2* 9.5* 8.8* 9.5* 9.0*  HCT 31.2* 33.1* 31.1* 29.3* 32.0* 30.2*  PLT 325.0 237  --  236 227 222  MCV 92.2 94.0  --  94.8 95.0 95.3  MCH  --  29.0  --  28.5 28.2 28.4  MCHC 31.7 30.8  --  30.0 29.7* 29.8*  RDW 28.4* 25.0*  --  24.7* 24.1* 24.1*  LYMPHSABS 1.6 1.3  --   --   --   --   MONOABS 0.6 0.6  --   --   --   --   EOSABS 0.1 0.1  --   --   --   --   BASOSABS 0.0 0.0  --   --   --   --     Chemistries   Recent Labs Lab 03/24/13 1514 03/25/13 0514 03/26/13 0522  NA 142 147 146  K 4.6 4.0 4.0  CL 104 110 111  CO2 22 23 23   GLUCOSE 80 77 76  BUN 25* 19 19  CREATININE 1.25* 1.14* 1.16*  CALCIUM 8.8 8.0* 8.1*    CBG: No results found for this basename: GLUCAP,  in the last 168 hours  GFR Estimated Creatinine Clearance: 38.8 ml/min (by C-G formula based on Cr of 1.16).  Coagulation profile No  results found for this basename: INR, PROTIME,  in the last 168 hours  Cardiac Enzymes No results found for this basename: CK, CKMB, TROPONINI, MYOGLOBIN,  in the last 168 hours  No components found with this basename: POCBNP,  No results found for this basename: DDIMER,  in the last 72 hours No results found for this basename: HGBA1C,  in the last 72 hours No results found for this basename: CHOL, HDL, LDLCALC, TRIG, CHOLHDL, LDLDIRECT,  in the last 72 hours No results found for this basename: TSH, T4TOTAL, FREET3, T3FREE, THYROIDAB,  in the last 72 hours  Recent Labs  03/24/13 1514 03/24/13 2245  VITAMINB12  --  238  FOLATE  --  12.0  FERRITIN  --  62  TIBC  --  277  IRON  --  49  RETICCTPCT 3.1  --     Recent Labs  03/24/13 1514  LIPASE 19    Urine Studies No results found for this basename: UACOL, UAPR, USPG, UPH, UTP, UGL, UKET, UBIL, UHGB, UNIT, UROB, ULEU, UEPI, UWBC, URBC, UBAC, CAST, CRYS, UCOM, BILUA,  in the last 72 hours  MICROBIOLOGY: Recent Results (from the past 240 hour(s))  MRSA PCR SCREENING     Status: None   Collection Time    03/25/13  8:38 AM      Result Value Ref Range Status   MRSA by PCR NEGATIVE  NEGATIVE Final   Comment:            The GeneXpert MRSA Assay (FDA     approved for NASAL specimens     only), is one component of a     comprehensive MRSA colonization     surveillance program. It is not     intended to diagnose MRSA     infection nor to guide or     monitor treatment for     MRSA infections.    RADIOLOGY STUDIES/RESULTS: Dg Chest 2 View  03/24/2013   CLINICAL DATA:  Shortness of breath  EXAM: CHEST  2 VIEW  COMPARISON:  DG CHEST 1 VIEW dated 09/10/2012  FINDINGS: The cardiac silhouette is enlarged. There is prominence at indistinctness of the pulmonary vasculature and interstitial markings. Areas of peribronchial cuffing are also identified. No focal regions of consolidation or focal infiltrates are appreciated. Blunting of the  costophrenic angles is appreciated. The osseous structures demonstrate chronic osteoarthritic changes within the left shoulder.  IMPRESSION: Interstitial infiltrate likely reflecting  pulmonary edema. Small bilateral effusions. Surveillance evaluation recommended.   Electronically Signed   By: Margaree Mackintosh M.D.   On: 03/24/2013 19:26    Oren Binet, MD  [facility] Pager:[phone]  If 7PM-7AM, please contact night-coverage [email or web address] Password [email or web address] 03/26/2013, 11:22 AM   LOS: 2 days

## 2013-03-26 NOTE — Progress Notes (Signed)
PT Cancellation Note  Patient Details Name: Beth Gutierrez MRN: [id number] DOB: 26-Jul-1921   Cancelled Treatment:    Reason Eval/Treat Not Completed: Patient at procedure or test/unavailable. Pt off floor at ENDO. PT to return as able.   Kingsley Callander 03/26/2013, 2:04 PM

## 2013-03-26 NOTE — Op Note (Signed)
[facility] [place], [id number]   ENDOSCOPY PROCEDURE REPORT  PATIENT: Beth Gutierrez, Beth Gutierrez  MR#: [id number] BIRTHDATE: [birth year] , 89  yrs. old GENDER: Female ENDOSCOPIST: Gatha Mayer, MD, [facility] PROCEDURE DATE:  03/26/2013 PROCEDURE:  EGD, diagnostic ASA CLASS:     Class III INDICATIONS:  Hematemesis.  (Coffee grounds) and melena with acute Hgb decrease MEDICATIONS: Fentanyl 25 mcg IV and Versed 2 mg IV TOPICAL ANESTHETIC: Cetacaine Spray  DESCRIPTION OF PROCEDURE: After the risks benefits and alternatives of the procedure were thoroughly explained, informed consent was obtained.  The PENTAX GASTOROSCOPE [id number] endoscope was introduced through the mouth and advanced to the second portion of the duodenum. Without limitations.  The instrument was slowly withdrawn as the mucosa was fully examined.    STOMACH: Mild and moderate erosive gastritis (inflammation) was found in the prepyloric region of the stomach.  DUODENUM: A small  round, shallow and clean-based ulcer was found in the duodenal bulb.  The remainder of the upper endoscopy exam was otherwise normal. Retroflexed views revealed no abnormalities.     The scope was then withdrawn from the patient and the procedure completed.  COMPLICATIONS: There were no complications. ENDOSCOPIC IMPRESSION: 1.   Erosive gastritis (inflammation) was found in the prepyloric region of the stomach 2.   Small  ulcer was found in the duodenal bulb 3.   The remainder of the upper endoscopy exam was otherwise normal - think gastritis and ulcer caused bleeding which has resolved but caused admission  RECOMMENDATIONS: 1.  Continue PPI 2.   H.  pylori serology - if + treat Daily PPI at dc - chronic to prevent future bleed Ferrous sulfate and f/u Hgb by PCP Should not need GI f/u unless not responding to Tx   eSigned:  Gatha Mayer, MD, [facility] 03/26/2013 3:35 PM revise

## 2013-03-27 ENCOUNTER — Ambulatory Visit: Payer: Medicare Other | Admitting: Family Medicine

## 2013-03-27 ENCOUNTER — Encounter (HOSPITAL_COMMUNITY): Payer: Self-pay | Admitting: Internal Medicine

## 2013-03-27 DIAGNOSIS — D649 Anemia, unspecified: Secondary | ICD-10-CM

## 2013-03-27 LAB — CBC
HEMATOCRIT: 30.3 % — AB (ref 36.0–46.0)
Hemoglobin: 9.2 g/dL — ABNORMAL LOW (ref 12.0–15.0)
MCH: 28.8 pg (ref 26.0–34.0)
MCHC: 30.4 g/dL (ref 30.0–36.0)
MCV: 95 fL (ref 78.0–100.0)
PLATELETS: 237 10*3/uL (ref 150–400)
RBC: 3.19 MIL/uL — ABNORMAL LOW (ref 3.87–5.11)
RDW: 23.3 % — ABNORMAL HIGH (ref 11.5–15.5)
WBC: 7.6 10*3/uL (ref 4.0–10.5)

## 2013-03-27 MED ORDER — PANTOPRAZOLE SODIUM 40 MG PO TBEC: 40.0000 mg | DELAYED_RELEASE_TABLET | Freq: Every day | ORAL | Status: DC

## 2013-03-27 MED ORDER — TRAMADOL HCL 50 MG PO TABS: 50.0000 mg | ORAL_TABLET | Freq: Three times a day (TID) | ORAL | Status: DC | PRN

## 2013-03-27 NOTE — Clinical Social Work Placement (Deleted)
Clinical Social Work Department CLINICAL SOCIAL WORK PLACEMENT NOTE 03/27/2013  Patient:  Beth Gutierrez  Account Number:  [id number] Admit date:  03/25/2013  Clinical Social Worker:  Kemper Durie, [place]  Date/time:  03/27/2013 11:55 AM  Clinical Social Work is seeking post-discharge placement for this patient at the following level of care:   [facility]   (*CSW will update this form in Epic as items are completed)   03/27/2013  Patient/family provided with [facility] Department of Clinical Social Work's list of facilities offering this level of care within the geographic area requested by the patient (or if unable, by the patient's family).  03/27/2013  Patient/family informed of their freedom to choose among providers that offer the needed level of care, that participate in Medicare, Medicaid or managed care program needed by the patient, have an available bed and are willing to accept the patient.  03/27/2013  Patient/family informed of [facility]' ownership interest in [facility], as well as of the fact that they are under no obligation to receive care at this facility.  PASARR submitted to EDS on  PASARR number received from [facility] on   FL2 transmitted to all facilities in geographic area requested by pt/family on  03/27/2013 FL2 transmitted to all facilities within larger geographic area on   Patient informed that his/her managed care company has contracts with or will negotiate with  certain facilities, including the following:     Patient/family informed of bed offers received:   Patient chooses bed at  Physician recommends and patient chooses bed at    Patient to be transferred to  on   Patient to be transferred to facility by   The following physician request were entered in Epic:   Additional Comments:   Liz [place], [place], [place], [phone]

## 2013-03-27 NOTE — Progress Notes (Signed)
Patient was discharged to nursing home by MD order; discharged instructions  Sent with patient to [facility]; IV DIC; skin intact; no acute distress noted at this time, no complaints; patient will be transported via EMS.

## 2013-03-27 NOTE — Evaluation (Signed)
Physical Therapy Evaluation Patient Details Name: Beth Gutierrez MRN: [id number] DOB: September 10, 1921 Today's Date: 03/27/2013 Time: 1914-7829 PT Time Calculation (min): 30 min  PT Assessment / Plan / Recommendation History of Present Illness  Beth Gutierrez is a 80 y.o. female WITH cardiomyopathy, ckd stage 3, severe arthritis, in alf, came infor the above complaints, the vomitus is dark as pe rthe pt, and fecal occult was positive. .  Clinical Impression  Patient presents with decreased independence with mobility due to deficits listed below.  She is not far off from her baseline, but any decline can increase her risk of falls.  Sounds as if she has a good deal of assist at [facility], so addition of BSC may reduce fall risk in the short term while she recovers from this hospitalization.  Will benefit from skilled PT in the acute setting to allow return to ALF with HHPT.    PT Assessment  Patient needs continued PT services    Follow Up Recommendations  Home health PT;Supervision - Intermittent    Does the patient have the potential to tolerate intense rehabilitation    N/A  Barriers to Discharge  None      Equipment Recommendations  Rolling walker with 5" wheels;3in1 (PT) (unsure if will be able to find her rollator she states is somewhere in the hospital)    Recommendations for Other Services   None  Frequency Min 3X/week    Precautions / Restrictions Precautions Precautions: Fall Restrictions Weight Bearing Restrictions: No   Pertinent Vitals/Pain C/o right knee pain (chronic, controlled with injections)      Mobility  Bed Mobility Overal bed mobility: Needs Assistance Bed Mobility: Supine to Sit Supine to sit: HOB elevated;Supervision General bed mobility comments: heavy rail use and increased time Transfers Overall transfer level: Needs assistance Transfers: Sit to/from Stand Sit to Stand: Min assist General transfer comment: min assist up from bed,  supervision to chair with appropriate safety techniques Ambulation/Gait Ambulation/Gait assistance: Supervision Ambulation Distance (Feet): 25 Feet Assistive device: Rolling walker (2 wheeled) Gait Pattern/deviations: Step-through pattern;Antalgic;Decreased step length - left;Decreased stride length;Shuffle Gait velocity interpretation: <1.8 ft/sec, indicative of risk for recurrent falls General Gait Details: generally stable wtih walker without any loss of balance antalgic on right, turned to back up to chair with walker without cues with safe technique    Exercises General Exercises - Lower Extremity Heel Slides: AROM;Both;5 reps;Supine   PT Diagnosis: Abnormality of gait;Generalized weakness  PT Problem List: Decreased strength;Decreased mobility;Decreased balance;Pain;Decreased activity tolerance PT Treatment Interventions: DME instruction;Therapeutic exercise;Gait training;Functional mobility training;Therapeutic activities;Patient/family education;Balance training     PT Goals(Current goals can be found in the care plan section) Acute Rehab PT Goals Patient Stated Goal: To return to [facility] PT Goal Formulation: With patient Time For Goal Achievement: 04/10/13 Potential to Achieve Goals: Good  Visit Information  Last PT Received On: 03/27/13 Assistance Needed: +1 History of Present Illness: Beth Gutierrez is a 80 y.o. female WITH cardiomyopathy, ckd stage 3, severe arthritis, in alf, came infor the above complaints, the vomitus is dark as pe rthe pt, and fecal occult was positive. .       Prior Functioning  Home Living Family/patient expects to be discharged to:: Assisted living Home Equipment: Walker - 4 wheels Additional Comments: states she had a rollator on admission, not sure where it is now Prior Function Level of Independence: Needs assistance Gait / Transfers Assistance Needed: walks with walker independent to bathroom and dining room ADL's / Homemaking  Assistance Needed: assist for bath and dressing Communication Communication: No difficulties Dominant Hand: Right    Cognition  Cognition Arousal/Alertness: Awake/alert Behavior During Therapy: WFL for tasks assessed/performed Overall Cognitive Status: No family/caregiver present to determine baseline cognitive functioning (pt oriented, but some comments seemed suspicious for confusion)    Extremity/Trunk Assessment Upper Extremity Assessment Upper Extremity Assessment: RUE deficits/detail;LUE deficits/detail RUE Deficits / Details: AROM about 95 degrees shoulder flexion strength at least 3/5 LUE Deficits / Details: AAROM shoulder flexion about 40-50 degrees with pain, strength about 2/5 in shoulder otherwise at least 3/5 Lower Extremity Assessment Lower Extremity Assessment: RLE deficits/detail;LLE deficits/detail RLE Deficits / Details: limited knee flexion in supine about 50 degrees, in sitting able to flex appropriately for sit <> stand; lifts leg antigravity LLE Deficits / Details: Overall WFL Cervical / Trunk Assessment Cervical / Trunk Assessment: Kyphotic   Balance Balance Overall balance assessment: History of Falls;Needs assistance Sitting-balance support: Feet unsupported;Single extremity supported Sitting balance-Leahy Scale: Good Sitting balance - Comments: sitting edge of bed 3-4 minutes without difficulty Standing balance-Leahy Scale: Poor Standing balance comment: needs UE assist for balance  End of Session PT - End of Session Equipment Utilized During Treatment: Gait belt Activity Tolerance: Patient limited by pain Patient left: in chair;with call bell/phone within reach  GP     [facility] 03/27/2013, 10:18 AM Magda Kiel, [place] 03/27/2013

## 2013-03-27 NOTE — Discharge Summary (Signed)
PATIENT DETAILS Name: Beth Gutierrez Age: 80 y.o. Sex: female Date of Birth: 08/29/1921 MRN: [id number]. Admit Date: 03/24/2013 Admitting Physician: Hosie Poisson, MD SVX:[id number] Jenny Reichmann, MD  Recommendations for Outpatient Follow-up:  1. Monitor CBC, chemistries in 1 week. 2. Continue PPI indefinitely for now 3. If GI bleeding reoccurs, consider discontinuing aspirin.  PRIMARY DISCHARGE DIAGNOSIS:  Active Problems:   Melena   Erosive gastritis with hemorrhage   GOUT   HYPERTENSION   Chronic systolic heart failure   CKD (chronic kidney disease) stage 2, GFR 60-89 ml/min   Melena   Loss of weight     PAST MEDICAL HISTORY: Past Medical History  Diagnosis Date  . Acute myocardial infarction, unspecified site, episode of care unspecified   . Congestive heart failure, unspecified   . Coronary atherosclerosis of unspecified type of vessel, native or graft   . Unspecified essential hypertension   . Other and unspecified hyperlipidemia   . Esophageal reflux   . Depressive disorder, not elsewhere classified   . Morbid obesity   . Need for prophylactic vaccination and inoculation against influenza   . Allergic rhinitis, cause unspecified   . Other malaise and fatigue   . Disorder of bone and cartilage, unspecified   . Diverticulosis of colon (without mention of hemorrhage)   . Benign neoplasm of colon   . Gout, unspecified   . Unspecified glaucoma   . Osteoarthrosis, unspecified whether generalized or localized, unspecified site   . Gout flare 02/06/2011  . Anemia, unspecified 04/05/2011  . Pneumonia   . Headache(784.0)   . Acute duodenal ulcer with hemorrhage 03/26/2013    DISCHARGE MEDICATIONS:   Medication List    STOP taking these medications       ECOTRIN LOW STRENGTH 81 MG EC tablet  Generic drug:  aspirin      TAKE these medications       acetaminophen 325 MG tablet  Commonly known as:  TYLENOL  Take 650 mg by mouth 3 (three) times daily.     allopurinol 100 MG tablet  Commonly known as:  ZYLOPRIM  Take 1 tablet (100 mg total) by mouth daily.     atorvastatin 80 MG tablet  Commonly known as:  LIPITOR  Take 1 tablet (80 mg total) by mouth daily at 6 PM.     cholecalciferol 1000 UNITS tablet  Commonly known as:  VITAMIN D  Take 1,000 Units by mouth daily.     citalopram 10 MG tablet  Commonly known as:  CELEXA  Take 1 tablet (10 mg total) by mouth daily.     docusate sodium 100 MG capsule  Commonly known as:  COLACE  Take 100 mg by mouth daily.     ferrous sulfate 325 (65 FE) MG tablet  Take 325 mg by mouth 2 (two) times daily.     Fish Oil 1000 MG Caps  Take 2,000 mg by mouth daily.     furosemide 20 MG tablet  Commonly known as:  LASIX  Take 20 mg by mouth daily.     Glucosamine 500 MG Caps  Take by mouth daily.     HYDROMET 5-1.5 MG/5ML syrup  Generic drug:  HYDROcodone-homatropine  Take 5 mLs by mouth every 6 (six) hours as needed for cough.     methocarbamol 500 MG tablet  Commonly known as:  ROBAXIN  Take 500 mg by mouth 3 (three) times daily as needed for muscle spasms.     metoprolol tartrate 25 MG tablet  Commonly known as:  LOPRESSOR  Take 12.5 mg by mouth 2 (two) times daily.     ondansetron 4 MG tablet  Commonly known as:  ZOFRAN  Take 1 tablet (4 mg total) by mouth every 8 (eight) hours as needed for nausea.     pantoprazole 40 MG tablet  Commonly known as:  PROTONIX  Take 1 tablet (40 mg total) by mouth daily before breakfast.     traMADol 50 MG tablet  Commonly known as:  ULTRAM  Take 1 tablet (50 mg total) by mouth every 8 (eight) hours as needed (pain).     Turmeric 450 MG Caps  Take 450 mg by mouth daily.        ALLERGIES:   Allergies  Allergen Reactions  . Sulfonamide Derivatives Other (See Comments)    unknown    BRIEF HPI:  See H&P, Labs, Consult and Test reports for all details in brief, patient is 80 year old female with history of card or myopathy, coronary  artery disease, on aspirin who presented to ED with melanotic stools. She was then referred to the hospitalist service for further evaluation and treatment.  CONSULTATIONS:   GI  PERTINENT RADIOLOGIC STUDIES: Dg Chest 2 View  03/24/2013   CLINICAL DATA:  Shortness of breath  EXAM: CHEST  2 VIEW  COMPARISON:  DG CHEST 1 VIEW dated 09/10/2012  FINDINGS: The cardiac silhouette is enlarged. There is prominence at indistinctness of the pulmonary vasculature and interstitial markings. Areas of peribronchial cuffing are also identified. No focal regions of consolidation or focal infiltrates are appreciated. Blunting of the costophrenic angles is appreciated. The osseous structures demonstrate chronic osteoarthritic changes within the left shoulder.  IMPRESSION: Interstitial infiltrate likely reflecting pulmonary edema. Small bilateral effusions. Surveillance evaluation recommended.   Electronically Signed   By: Margaree Mackintosh M.D.   On: 03/24/2013 19:26     PERTINENT LAB RESULTS: CBC:  Recent Labs  03/26/13 1705 03/27/13 0643  WBC 7.4 7.6  HGB 9.2* 9.2*  HCT 30.0* 30.3*  PLT 220 237   CMET CMP     Component Value Date/Time   NA 146 03/26/2013 0522   K 4.0 03/26/2013 0522   CL 111 03/26/2013 0522   CO2 23 03/26/2013 0522   GLUCOSE 76 03/26/2013 0522   BUN 19 03/26/2013 0522   CREATININE 1.16* 03/26/2013 0522   CALCIUM 8.1* 03/26/2013 0522   PROT 6.6 03/24/2013 1514   ALBUMIN 3.5 03/24/2013 1514   AST 29 03/24/2013 1514   ALT 14 03/24/2013 1514   ALKPHOS 68 03/24/2013 1514   BILITOT <0.2* 03/24/2013 1514   GFRNONAA 40* 03/26/2013 0522   GFRAA 47* 03/26/2013 0522    GFR Estimated Creatinine Clearance: 38.8 ml/min (by C-G formula based on Cr of 1.16).  Recent Labs  03/24/13 1514  LIPASE 19   No results found for this basename: CKTOTAL, CKMB, CKMBINDEX, TROPONINI,  in the last 72 hours No components found with this basename: POCBNP,  No results found for this basename: DDIMER,  in the last 72  hours No results found for this basename: HGBA1C,  in the last 72 hours No results found for this basename: CHOL, HDL, LDLCALC, TRIG, CHOLHDL, LDLDIRECT,  in the last 72 hours No results found for this basename: TSH, T4TOTAL, FREET3, T3FREE, THYROIDAB,  in the last 72 hours  Recent Labs  03/24/13 1514 03/24/13 2245  VITAMINB12  --  238  FOLATE  --  12.0  FERRITIN  --  62  TIBC  --  277  IRON  --  49  RETICCTPCT 3.1  --    Coags: No results found for this basename: PT, INR,  in the last 72 hours Microbiology: Recent Results (from the past 240 hour(s))  MRSA PCR SCREENING     Status: None   Collection Time    03/25/13  8:38 AM      Result Value Ref Range Status   MRSA by PCR NEGATIVE  NEGATIVE Final   Comment:            The GeneXpert MRSA Assay (FDA     approved for NASAL specimens     only), is one component of a     comprehensive MRSA colonization     surveillance program. It is not     intended to diagnose MRSA     infection nor to guide or     monitor treatment for     MRSA infections.     BRIEF HOSPITAL COURSE:  Melena  -Presented with melanotic stools, admitted to telemetry for evaluation of GI bleed. Thankfully, no melanotic stool since admission.  - Seen by gastroenterology, EGD on 2/12 showed erosive gastritis with a small nonbleeding ulcer in the duodenum. - GI recommending to Continue PPI daily, spoke with Dr. Carlean Purl today, okay to continue with 81 mg of aspirin for now.  Anemia  - Suspected mild acute blood loss anemia on top of chronic anemia  - Check hemoglobin and hematocrit next week - Patient did not require PRBC transfusion  Chronic systolic heart failure  - Last ejection fraction 07/14 25-30%  - Clinically compensated on discharge. Continue Lasix. Check daily weights while at sister living facility - Continue beta blockers, not a candidate for ACE given chronic kidney disease   Chronic kidney disease stage III  - Creatinine posterior usual  baseline, monitor periodically   History of coronary artery disease  - Spoke with gastroenterology, okay to resume low-dose aspirin.  - Resume statins, continue with low-dose beta blockers   Dyslipidemia  - Resume Lipitor  TODAY-DAY OF DISCHARGE:  Subjective:   Beth Gutierrez today has no headache,no chest abdominal pain,no new weakness tingling or numbness, feels much better wants to go home today. No further melanotic stools this admission  Objective:   Blood pressure 161/67, pulse 74, temperature 97.9 F (36.6 C), temperature source Oral, resp. rate 20, height 5\' 2"  (1.575 m), weight 111.5 kg (245 lb 13 oz), SpO2 95.00%.  Intake/Output Summary (Last 24 hours) at 03/27/13 1117 Last data filed at 03/27/13 0753  Gross per 24 hour  Intake      3 ml  Output   1250 ml  Net  -1247 ml   Filed Weights   03/24/13 2049  Weight: 111.5 kg (245 lb 13 oz)    Exam Awake Alert, Oriented *3, No new F.N deficits, Normal affect [place].AT,PERRAL Supple Neck,No JVD, No cervical lymphadenopathy appriciated.  Symmetrical Chest wall movement, Good air movement bilaterally, CTAB RRR,No Gallops,Rubs or new Murmurs, No Parasternal Heave +ve B.Sounds, Abd Soft, Non tender, No organomegaly appriciated, No rebound -guarding or rigidity. No Cyanosis, Clubbing or edema, No new Rash or bruise  DISCHARGE CONDITION: Stable  DISPOSITION: ALF   DISCHARGE INSTRUCTIONS:    Activity:  As tolerated with Full fall precautions use walker/cane & assistance as needed  Diet recommendation: Heart Healthy diet      Discharge Orders   Future Appointments Provider Department Dept Phone   04/08/2013 11:00 AM Irene Shipper, MD [place]  Gastroenterology [phone]   05/02/2013 3:00 PM Biagio Borg, MD [place] [phone]   06/19/2013 3:30 PM Biagio Borg, MD [place] [phone]   Future Orders Complete By Expires   ([place]) Call MD:  Anytime you have any of the following symptoms: 1) 3 pound weight gain in 24 hours or 5 pounds in 1 week 2) shortness of breath, with or without a dry hacking cough 3) swelling in the hands, feet or stomach 4) if you have to sleep on extra pillows at night in order to breathe.  As directed    Diet - low sodium heart healthy  As directed    Increase activity slowly  As directed       Follow-up Information   Follow up with Cathlean Cower, MD On 04/04/2013. (APPT scheduled for Friday, February 20th, 2015 @ 03:00pm with Dr. Biagio Borg.)    Specialties:  Internal Medicine, Radiology   Contact information:   [address] [address] [phone]         Total Time spent on discharge equals 45 minutes.  SignedOren Binet 03/27/2013 11:17 AM

## 2013-03-27 NOTE — Progress Notes (Signed)
[facility] Progress Note  Subjective:  No complaints.  No events O/N.  Objective:  Vital signs in last 24 hours: Temp:  [97.5 F (36.4 C)-98.4 F (36.9 C)] 97.9 F (36.6 C) (02/12 0534) Pulse Rate:  [60-84] 74 (02/12 0534) Resp:  [14-20] 20 (02/12 0534) BP: (103-189)/(39-78) 161/67 mmHg (02/12 0534) SpO2:  [94 %-100 %] 95 % (02/12 0534) Last BM Date: 03/24/13 General:  Alert, Well-developed, in NAD Heart:  Regular rate and rhythm; no murmurs Pulm:  CTAB.  No W/R/R. Abdomen:  Soft, non-distended. Normal bowel sounds.  Non-tender. Extremities:  Without edema. Neurologic:  Alert and  oriented x4;  grossly normal neurologically. Psych:  Alert and cooperative. Normal mood and affect.  Intake/Output from previous day: 02/11 0701 - 02/12 0700 In: 3 [I.V.:3] Out: 1150 [Urine:1150] Intake/Output this shift: Total I/O In: -  Out: 350 [Urine:350]  Lab Results:  Recent Labs  03/26/13 0522 03/26/13 1705 03/27/13 0643  WBC 8.1 7.4 7.6  HGB 9.0* 9.2* 9.2*  HCT 30.2* 30.0* 30.3*  PLT 222 220 237   BMET  Recent Labs  03/24/13 1514 03/25/13 0514 03/26/13 0522  NA 142 147 146  K 4.6 4.0 4.0  CL 104 110 111  CO2 22 23 23   GLUCOSE 80 77 76  BUN 25* 19 19  CREATININE 1.25* 1.14* 1.16*  CALCIUM 8.8 8.0* 8.1*   LFT  Recent Labs  03/24/13 1514  PROT 6.6  ALBUMIN 3.5  AST 29  ALT 14  ALKPHOS 68  BILITOT <0.2*   Assessment / Plan: -Small duodenal bulb ulcer and erosive gastritis seen on EGD 2/11. -Melena and CGE secondary to above.  Now resolved. -Acute on chronic anemia:  Hgb stable.  *Continue daily PPI upon D/C indefinitely. *Follow-up Hpylori serology and if positive then treat. *Continue iron supplements monitoring of Hgb per PCP. *Patient will likely be discharge today.  GI signing off and no further GI follow-up needed unless fails to respond to therapy.   LOS: 3 days   ZEHR, JESSICA D.  03/27/2013, 9:36 AM  Pager number  [place] Attending  I have also seen and assessed the patient and agree with the above note.  Gatha Mayer, MD, [facility] [phone] (pager) 03/27/2013 3:59 PM

## 2013-03-27 NOTE — Care Management Note (Signed)
    Page 1 of 1   03/27/2013     11:57:36 AM   CARE MANAGEMENT NOTE 03/27/2013  Patient:  Beth Gutierrez, Beth Gutierrez   Account Number:  [id number]  Date Initiated:  03/27/2013  Documentation initiated by:  Tomi Bamberger  Subjective/Objective Assessment:   dx melena, erosive gastritis  admit- from [facility]     Action/Plan:   Anticipated DC Date:  03/27/2013   Anticipated DC Plan:  [place]  CM consult      [facility] Choice  HOME HEALTH   Choice offered to / List presented to:          [facility] arranged  HH-1 RN  HH-2 PT      [facility] agency  OTHER - SEE NOTE   Status of service:  Completed, signed off Medicare Important Message given?   (If response is "NO", the following Medicare IM given date fields will be blank) Date Medicare IM given:   Date Additional Medicare IM given:    Discharge Disposition:  [place]  Per UR Regulation:  Reviewed for med. necessity/level of care/duration of stay  If discussed at [place] of Stay Meetings, dates discussed:    Comments:  03/27/13 [place], BSN [phone] patient is from [facility], for dc back today, CSW following.  Physical therapy rec hhpt, and will need HHRN, CSW will put on fl2 and the facility will use their [facility] services per CSW.

## 2013-03-28 ENCOUNTER — Ambulatory Visit: Payer: Medicare Other | Admitting: Family Medicine

## 2013-03-28 LAB — H. PYLORI ANTIBODY, IGG: H Pylori IgG: 3.94 {ISR} — ABNORMAL HIGH

## 2013-03-28 NOTE — Progress Notes (Signed)
PT G-Code Note:  03/27/13 1100  PT G-Codes **NOT FOR INPATIENT CLASS**  Functional Assessment Tool Used Clinical Observation  Functional Limitation Mobility: Walking and moving around  Mobility: Walking and Moving Around Current Status [phone]) CJ  Mobility: Walking and Moving Around Goal Status [phone]) CJ  Mobility: Walking and Moving Around Discharge Status [phone]) Levada Dy [place], [address] 03/28/2013

## 2013-04-01 NOTE — Progress Notes (Signed)
Quick Note:  H. Pylori + Treat with Pylera x 10 days If unable to get that then Amoxicillin 100 mg bid and clarithromycin 500 mg bid with bid PPI x 10 days ______

## 2013-04-02 ENCOUNTER — Other Ambulatory Visit: Payer: Self-pay

## 2013-04-02 MED ORDER — BIS SUBCIT-METRONID-TETRACYC 140-125-125 MG PO CAPS: 3.0000 | ORAL_CAPSULE | Freq: Three times a day (TID) | ORAL | Status: DC

## 2013-04-04 ENCOUNTER — Ambulatory Visit: Payer: Medicare Other | Admitting: Internal Medicine

## 2013-04-08 ENCOUNTER — Ambulatory Visit: Payer: Medicare Other

## 2013-04-08 ENCOUNTER — Encounter: Payer: Self-pay | Admitting: Physician Assistant

## 2013-04-08 ENCOUNTER — Ambulatory Visit (INDEPENDENT_AMBULATORY_CARE_PROVIDER_SITE_OTHER): Payer: Medicare Other | Admitting: Physician Assistant

## 2013-04-08 ENCOUNTER — Telehealth: Payer: Self-pay

## 2013-04-08 VITALS — BP 130/58 | HR 68

## 2013-04-08 DIAGNOSIS — K922 Gastrointestinal hemorrhage, unspecified: Secondary | ICD-10-CM

## 2013-04-08 DIAGNOSIS — D649 Anemia, unspecified: Secondary | ICD-10-CM

## 2013-04-08 LAB — CBC WITH DIFFERENTIAL/PLATELET
Basophils Absolute: 0 10*3/uL (ref 0.0–0.1)
Basophils Relative: 0.3 % (ref 0.0–3.0)
Eosinophils Absolute: 0.1 10*3/uL (ref 0.0–0.7)
Eosinophils Relative: 0.6 % (ref 0.0–5.0)
HCT: 34.8 % — ABNORMAL LOW (ref 36.0–46.0)
Hemoglobin: 11 g/dL — ABNORMAL LOW (ref 12.0–15.0)
Lymphocytes Relative: 14.5 % (ref 12.0–46.0)
Lymphs Abs: 1.5 10*3/uL (ref 0.7–4.0)
MCHC: 31.6 g/dL (ref 30.0–36.0)
MCV: 94 fl (ref 78.0–100.0)
MONO ABS: 0.6 10*3/uL (ref 0.1–1.0)
Monocytes Relative: 6.2 % (ref 3.0–12.0)
Neutro Abs: 7.9 10*3/uL — ABNORMAL HIGH (ref 1.4–7.7)
Neutrophils Relative %: 78.4 % — ABNORMAL HIGH (ref 43.0–77.0)
Platelets: 272 10*3/uL (ref 150.0–400.0)
RBC: 3.71 Mil/uL — ABNORMAL LOW (ref 3.87–5.11)
RDW: 27.2 % — ABNORMAL HIGH (ref 11.5–14.6)
WBC: 10.1 10*3/uL (ref 4.5–10.5)

## 2013-04-08 NOTE — Telephone Encounter (Signed)
Pt came in today and was seen by Nicoletta Ba PA. See OV note.

## 2013-04-08 NOTE — Telephone Encounter (Signed)
Yes, have her start a probiotic 2 weeks. As well, can use Imodium. If still having problems, she can see an extender next week.

## 2013-04-08 NOTE — Telephone Encounter (Signed)
Spoke with pt regarding her OV appt. Pt was planning on coming in today. States she is having a problem with her bowels being "loose." Pt states she has not taken anything for this. Let pt know I would get Dr. Blanch Media recommendation regarding this and try to keep her from having to come out in the bad weather. Please advise.

## 2013-04-08 NOTE — Progress Notes (Signed)
Agree with assessment and plans 

## 2013-04-08 NOTE — Patient Instructions (Signed)
Please go to the basement level to have your labs drawn.  Finish the Pylera - antibiotic regimine. Continue Protonix 40 mg once daily. Take the Ferrous Sulfate for one more month then stop.

## 2013-04-08 NOTE — Telephone Encounter (Signed)
Left message for pt to call back  °

## 2013-04-08 NOTE — Progress Notes (Signed)
Subjective:    Patient ID: KEYONDA BICKLE, female    DOB: 06-04-1921, 80 y.o.   MRN: [id number]  HPI  Iva is a very nice 80 year old female who was recently hospitalized earlier in February 2015 with complaints of melena. She has history of chronic kidney disease stage III, cardiomyopathy, severe arthritis, and coronary artery disease status post MI and stents. Patient was found to have melena on exam and hemoglobin of 8.8 on admission to the hospital. She had GI consultation done per Dr. Carlean Purl and underwent EGD on 03/26/2013 with finding of erosive gastritis and a small duodenal bulb ulcer without any stigmata of active bleeding. Biopsies were done and she was found to be H. pylori positive. She had also noted to be iron deficient mildly. Colonoscopy was not undertaken due to positive findings at EGD. Patient was discharged home on Protonix 40 mg by mouth daily and also on ferrous sulfate twice daily. She has since started a course of  Pylera started to treat H. pylori. She comes in today for followup and says that stools have still been dark intermittently which concerns her. She  has no complaints of abdominal discomfort, diarrhea nausea etc. She says her appetite is good and she has been eating well. She is having one bowel movement per day often loose and dark as related above.    Review of Systems  Constitutional: Positive for fatigue.  HENT: Negative.   Eyes: Negative.   Respiratory: Negative.   Cardiovascular: Negative.   Gastrointestinal: Positive for blood in stool.  Endocrine: Negative.   Genitourinary: Negative.   Musculoskeletal: Positive for arthralgias and gait problem.  Allergic/Immunologic: Negative.   Neurological: Negative.   Hematological: Negative.   Psychiatric/Behavioral: Negative.    Outpatient Prescriptions Prior to Visit  Medication Sig Dispense Refill  . acetaminophen (TYLENOL) 325 MG tablet Take 650 mg by mouth 3 (three) times daily.      Marland Kitchen  allopurinol (ZYLOPRIM) 100 MG tablet Take 1 tablet (100 mg total) by mouth daily.  90 tablet  3  . atorvastatin (LIPITOR) 80 MG tablet Take 1 tablet (80 mg total) by mouth daily at 6 PM.      . bismuth-metronidazole-tetracycline (PYLERA) 140-125-125 MG per capsule Take 3 capsules by mouth 4 (four) times daily -  before meals and at bedtime.  120 capsule  0  . cholecalciferol (VITAMIN D) 1000 UNITS tablet Take 1,000 Units by mouth daily.      . citalopram (CELEXA) 10 MG tablet Take 1 tablet (10 mg total) by mouth daily.  90 tablet  3  . docusate sodium (COLACE) 100 MG capsule Take 100 mg by mouth daily.       . ferrous sulfate 325 (65 FE) MG tablet Take 325 mg by mouth 2 (two) times daily.       . furosemide (LASIX) 20 MG tablet Take 20 mg by mouth daily.      . Glucosamine 500 MG CAPS Take by mouth daily.      Marland Kitchen HYDROcodone-homatropine (HYDROMET) 5-1.5 MG/5ML syrup Take 5 mLs by mouth every 6 (six) hours as needed for cough.      . methocarbamol (ROBAXIN) 500 MG tablet Take 500 mg by mouth 3 (three) times daily as needed for muscle spasms.       . metoprolol tartrate (LOPRESSOR) 25 MG tablet Take 12.5 mg by mouth 2 (two) times daily.      . Omega-3 Fatty Acids (FISH OIL) 1000 MG CAPS Take 2,000 mg by  mouth daily.       . ondansetron (ZOFRAN) 4 MG tablet Take 1 tablet (4 mg total) by mouth every 8 (eight) hours as needed for nausea.  30 tablet  4  . pantoprazole (PROTONIX) 40 MG tablet Take 1 tablet (40 mg total) by mouth daily before breakfast.  30 tablet  0  . traMADol (ULTRAM) 50 MG tablet Take 1 tablet (50 mg total) by mouth every 8 (eight) hours as needed (pain).  30 tablet  0  . Turmeric 450 MG CAPS Take 450 mg by mouth daily.        No facility-administered medications prior to visit.   Allergies  Allergen Reactions  . Sulfonamide Derivatives Other (See Comments)    unknown     Patient Active Problem List   Diagnosis Date Noted  . Erosive gastritis with hemorrhage 03/26/2013  .  Acute duodenal ulcer with hemorrhage 03/26/2013  . Melena 03/25/2013  . Loss of weight 03/25/2013  . Coffee ground emesis 03/25/2013  . Primary localized osteoarthrosis, lower leg 02/25/2013  . Osteoarthritis of left shoulder 01/21/2013  . Left shoulder pain 12/17/2012  . Adhesive capsulitis of left shoulder 12/17/2012  . Generalized weakness 09/23/2012  . Closed fracture of unspecified part of upper end of humerus 09/10/2012  . NSTEMI (non-ST elevated myocardial infarction) 09/10/2012  . UTI (urinary tract infection) 06/12/2012  . Abdominal pain, epigastric 06/03/2012  . N&V (nausea and vomiting) 06/03/2012  . Bilateral shoulder pain 10/13/2011  . Gait disorder 10/13/2011  . Anemia, iron deficiency 04/05/2011  . Gout flare 02/06/2011  . CKD (chronic kidney disease) stage 2, GFR 60-89 ml/min 01/29/2011  . Pain in joint of right knee 01/06/2011  . Preventative health care 01/04/2011  . FOOT PAIN, LEFT 04/21/2010  . GLAUCOMA 08/17/2008  . DEGENERATIVE JOINT DISEASE 08/17/2008  . DEPRESSION 07/03/2007  . Chronic systolic heart failure [id number]  . ALLERGIC RHINITIS 07/03/2007  . GERD 07/03/2007  . OSTEOPENIA 07/03/2007  . FATIGUE 07/03/2007  . Benign neoplasm of colon 06/15/2007  . OBESITY, MORBID 06/15/2007  . Old myocardial infarction 06/15/2007  . DIVERTICULOSIS, COLON 06/15/2007  . HYPERLIPIDEMIA 10/04/2006  . GOUT 10/04/2006  . HYPERTENSION 10/04/2006  . CORONARY ARTERY DISEASE 10/04/2006   History  Substance Use Topics  . Smoking status: Former Smoker    Quit date: 05/30/1990  . Smokeless tobacco: Never Used  . Alcohol Use: No   family history includes Breast cancer in her sister; Hypertension in her father; Stroke in her father.      Objective:   Physical Exam   Well-developed elderly female in no acute distress, sitting in a wheelchair, quite pleasant blood pressure 130/58 pulse 68. HEENT; nontraumatic normocephalic EOMI PERRLA sclera anicteric, Supple; no  JVD, Cardiovascular; regular rate and rhythm with S1-S2 no murmur or gallop, Pulmonary; clear bilaterally, Abdomen; large soft nontender nondistended bowel sounds are active there is no palpable mass or hepatosplenomegaly, Rectal exam dark brown stool heme-negative, Extremities; no clubbing cyanosis or edema skin warm and dry, Psych; mood and affect appropriate       Assessment & Plan:  #48 80 year old female with recent admission for melena and iron deficiency anemia with finding of erosive gastritis and a small bulbar ulcer on EGD, H. pylori positive and being treated #2 persistent intermittent dark stools-patient is Hemoccult-negative today and suspect are stools are from combination of bismuth in the dilator a formulation and ferrous sulfate #3 cardiomyopathy #4 coronary artery disease status post MI and stents #5 chronic kidney  disease stage III #6 hypertension #7 degenerative joint disease #8 diverticulosis #9 history of adenomatous colon polyps colonoscopy 2002 last colonoscopy done 2005 with one 5 mm polyp removed  Plan; repeat CBC today. Complete current course of Pylera x14 days Continue Protonix 40 mg by mouth every morning Would continue ferrous sulfate for 1 more month and then discontinue  Followup with GI as needed/Dr. Henrene Pastor

## 2013-04-14 ENCOUNTER — Telehealth: Payer: Self-pay | Admitting: Internal Medicine

## 2013-04-14 NOTE — Telephone Encounter (Signed)
Pt states she is having diarrhea all the time and abdominal pain. Pt requesting to be seen. Pt scheduled to see Nicoletta Ba PA tomorrow at 9:30am. Facility aware of appt date and time.

## 2013-04-15 ENCOUNTER — Ambulatory Visit (INDEPENDENT_AMBULATORY_CARE_PROVIDER_SITE_OTHER): Payer: Medicare Other | Admitting: Physician Assistant

## 2013-04-15 ENCOUNTER — Other Ambulatory Visit (INDEPENDENT_AMBULATORY_CARE_PROVIDER_SITE_OTHER): Payer: Medicare Other

## 2013-04-15 ENCOUNTER — Encounter: Payer: Self-pay | Admitting: Physician Assistant

## 2013-04-15 VITALS — BP 100/60 | HR 66 | Ht 62.0 in | Wt 202.4 lb

## 2013-04-15 DIAGNOSIS — R197 Diarrhea, unspecified: Secondary | ICD-10-CM

## 2013-04-15 LAB — CBC WITH DIFFERENTIAL/PLATELET
Basophils Absolute: 0 10*3/uL (ref 0.0–0.1)
Basophils Relative: 0.2 % (ref 0.0–3.0)
EOS ABS: 0 10*3/uL (ref 0.0–0.7)
Eosinophils Relative: 0.4 % (ref 0.0–5.0)
HCT: 34.2 % — ABNORMAL LOW (ref 36.0–46.0)
Hemoglobin: 10.8 g/dL — ABNORMAL LOW (ref 12.0–15.0)
LYMPHS PCT: 13.9 % (ref 12.0–46.0)
Lymphs Abs: 1.2 10*3/uL (ref 0.7–4.0)
MCHC: 31.6 g/dL (ref 30.0–36.0)
MCV: 93.9 fl (ref 78.0–100.0)
MONO ABS: 0.4 10*3/uL (ref 0.1–1.0)
Monocytes Relative: 5.4 % (ref 3.0–12.0)
NEUTROS PCT: 80.1 % — AB (ref 43.0–77.0)
Neutro Abs: 6.6 10*3/uL (ref 1.4–7.7)
Platelets: 259 10*3/uL (ref 150.0–400.0)
RBC: 3.64 Mil/uL — ABNORMAL LOW (ref 3.87–5.11)
RDW: 27.2 % — ABNORMAL HIGH (ref 11.5–14.6)
WBC: 8.3 10*3/uL (ref 4.5–10.5)

## 2013-04-15 MED ORDER — DICYCLOMINE HCL 10 MG PO CAPS: ORAL_CAPSULE | ORAL | Status: DC

## 2013-04-15 MED ORDER — SACCHAROMYCES BOULARDII 250 MG PO CAPS: 250.0000 mg | ORAL_CAPSULE | Freq: Two times a day (BID) | ORAL | Status: DC

## 2013-04-15 MED ORDER — METRONIDAZOLE 250 MG PO TABS: 250.0000 mg | ORAL_TABLET | Freq: Four times a day (QID) | ORAL | Status: DC

## 2013-04-15 NOTE — Progress Notes (Signed)
Subjective:    Patient ID: Beth Gutierrez, female    DOB: 07-08-1921, 80 y.o.   MRN: [id number]  HPI Beth Gutierrez is a very nice 80 year old female who was hospitalized in February 2015 with complaints of melena area she had GI consultation with Dr. Carlean Purl and underwent EGD on 03/26/2013 with finding of erosive gastritis and a small duodenal bulb ulcer without evidence of active bleeding. Biopsies were done she was found to be H. pylori positive and has just completed a course of highly or a. She was also noted to be mildly iron deficient and was placed on iron during her hospitalization. She was seen back in the office on February 24 at that time complaining of persistent dark stools. She was examined and found to be Hemoccult-negative. CBC was also repeated -her hemoglobin was improved over time of discharge with hemoglobin in the 11 range. She comes back today brought in from her assisted living facility with complaints of diarrhea and persistence of dark stools. She says she has been having episodes of urgency and incontinence of stools over the past week or so. She is unable to tell me exactly how many  bowel movements she is having a day but this is her second episode today so far and she was partially incontinent on the way to the bathroom in our office. She denies any abdominal pain. Her appetite is good her weight has been stable. No nausea or vomiting, no fever or chills. She is still taking ferrous sulfate twice daily.    Review of Systems  HENT: Negative.   Eyes: Negative.   Respiratory: Negative.   Cardiovascular: Negative.   Gastrointestinal: Positive for diarrhea.  Endocrine: Negative.   Genitourinary: Negative.   Musculoskeletal: Positive for gait problem.  Allergic/Immunologic: Negative.   Neurological: Positive for weakness.  Hematological: Negative.   Psychiatric/Behavioral: Negative.    Outpatient Prescriptions Prior to Visit  Medication Sig Dispense Refill  .  acetaminophen (TYLENOL) 325 MG tablet Take 650 mg by mouth 3 (three) times daily.      Marland Kitchen allopurinol (ZYLOPRIM) 100 MG tablet Take 1 tablet (100 mg total) by mouth daily.  90 tablet  3  . aspirin 81 MG tablet Take 81 mg by mouth daily.      Marland Kitchen atorvastatin (LIPITOR) 80 MG tablet Take 1 tablet (80 mg total) by mouth daily at 6 PM.      . bismuth-metronidazole-tetracycline (PYLERA) 140-125-125 MG per capsule Take 3 capsules by mouth 4 (four) times daily -  before meals and at bedtime.  120 capsule  0  . cholecalciferol (VITAMIN D) 1000 UNITS tablet Take 1,000 Units by mouth daily.      . citalopram (CELEXA) 10 MG tablet Take 1 tablet (10 mg total) by mouth daily.  90 tablet  3  . docusate sodium (COLACE) 100 MG capsule Take 100 mg by mouth daily.       . ferrous sulfate 325 (65 FE) MG tablet Take 325 mg by mouth 2 (two) times daily.       . furosemide (LASIX) 20 MG tablet Take 20 mg by mouth daily.      . Glucosamine 500 MG CAPS Take by mouth daily.      Marland Kitchen HYDROcodone-homatropine (HYDROMET) 5-1.5 MG/5ML syrup Take 5 mLs by mouth every 6 (six) hours as needed for cough.      . methocarbamol (ROBAXIN) 500 MG tablet Take 500 mg by mouth 3 (three) times daily as needed for muscle spasms.       Marland Kitchen  metoprolol tartrate (LOPRESSOR) 25 MG tablet Take 12.5 mg by mouth 2 (two) times daily.      . Omega-3 Fatty Acids (FISH OIL) 1000 MG CAPS Take 2,000 mg by mouth daily.       . ondansetron (ZOFRAN) 4 MG tablet Take 1 tablet (4 mg total) by mouth every 8 (eight) hours as needed for nausea.  30 tablet  4  . pantoprazole (PROTONIX) 40 MG tablet Take 1 tablet (40 mg total) by mouth daily before breakfast.  30 tablet  0  . traMADol (ULTRAM) 50 MG tablet Take 1 tablet (50 mg total) by mouth every 8 (eight) hours as needed (pain).  30 tablet  0  . Turmeric 450 MG CAPS Take 450 mg by mouth daily.        No facility-administered medications prior to visit.   Allergies  Allergen Reactions  . Sulfonamide Derivatives  Other (See Comments)    unknown   Patient Active Problem List   Diagnosis Date Noted  . Erosive gastritis with hemorrhage 03/26/2013  . Acute duodenal ulcer with hemorrhage 03/26/2013  . Melena 03/25/2013  . Loss of weight 03/25/2013  . Primary localized osteoarthrosis, lower leg 02/25/2013  . Osteoarthritis of left shoulder 01/21/2013  . Left shoulder pain 12/17/2012  . Adhesive capsulitis of left shoulder 12/17/2012  . Generalized weakness 09/23/2012  . Closed fracture of unspecified part of upper end of humerus 09/10/2012  . NSTEMI (non-ST elevated myocardial infarction) 09/10/2012  . UTI (urinary tract infection) 06/12/2012  . Abdominal pain, epigastric 06/03/2012  . N&V (nausea and vomiting) 06/03/2012  . Bilateral shoulder pain 10/13/2011  . Gait disorder 10/13/2011  . Anemia, iron deficiency 04/05/2011  . Gout flare 02/06/2011  . CKD (chronic kidney disease) stage 2, GFR 60-89 ml/min 01/29/2011  . Pain in joint of right knee 01/06/2011  . Preventative health care 01/04/2011  . FOOT PAIN, LEFT 04/21/2010  . GLAUCOMA 08/17/2008  . DEGENERATIVE JOINT DISEASE 08/17/2008  . DEPRESSION 07/03/2007  . Chronic systolic heart failure 78/29/5621  . ALLERGIC RHINITIS 07/03/2007  . GERD 07/03/2007  . OSTEOPENIA 07/03/2007  . FATIGUE 07/03/2007  . Benign neoplasm of colon 06/15/2007  . OBESITY, MORBID 06/15/2007  . Old myocardial infarction 06/15/2007  . DIVERTICULOSIS, COLON 06/15/2007  . HYPERLIPIDEMIA 10/04/2006  . GOUT 10/04/2006  . HYPERTENSION 10/04/2006  . CORONARY ARTERY DISEASE 10/04/2006      History  Substance Use Topics  . Smoking status: Former Smoker    Quit date: 05/30/1990  . Smokeless tobacco: Never Used  . Alcohol Use: No    family history includes Breast cancer in her sister; Hypertension in her father; Stroke in her father. There is no history of Colon cancer.   Objective:   Physical Exam  well-developed elderly African American female in no acute  distress patient in a wheelchair. Blood pressure 100/60 pulse 66 height 5 foot 2 weight 202. HEENT; nontraumatic normocephalic EOMI PERRLA sclera anicteric, Nec;k supple no JVD, Cardiovascular regular rate and rhythm with S1-S2 no murmur or gallop, Pulmonary; clear bilaterally, Abdomen; large soft,  nontender nondistended bowel sounds are active there is no palpable mass or hepatosplenomegaly Rectal exam; not done, patient partially incontinent of a large dark bowel movement while in our office this was hemocculted and is Hemoccult negative, Extremities; no clubbing cyanosis or edema skin warm and dry, Psych; mood and affect appropriate        Assessment & Plan:  #53  80 year old female with recent hospitalization for melena and  found to have erosive gastritis and a small duodenal bulb ulcer. #2 mild iron deficiency anemia #3  H pylori positive and treated #4 new complaint of diarrhea and persistent dark stool which is Hemoccult negative. I suspect the dark stool is from ferrous sulfate. In concern she may have C. difficile or an antibiotic associated diarrhea given that she just completed a course of Pylera, and was recently hospitalized #5 other medical problems as outlined above  Plan; reemphasized that her stool is Hemoccult-negative in that she is not bleeding We'll repeat CBC today Stop ferrous sulfate Continue Protonix 40 mg once daily I do not think that she will be able to collect a stool specimen for C. difficile PCR and will go ahead and empirically treat her with Flagyl 250 mg by mouth 4 times daily x14 days and a course of Florastor  twice daily x14 days. Will also start Bentyl 10 mg by mouth every morning over the next couple of weeks. She will return to GI on a when necessary basis

## 2013-04-15 NOTE — Progress Notes (Signed)
Agree with initial assessment and plans as outlined 

## 2013-04-15 NOTE — Patient Instructions (Signed)
We have given you printed prescriptions. 1. Flagyl ( Metronidazole ) 2. Bentyl ( Dicyclomine )  3. Flrastor ( probiotic )   Stop the Colace and the oral iron.

## 2013-04-16 ENCOUNTER — Telehealth: Payer: Self-pay | Admitting: *Deleted

## 2013-04-16 NOTE — Telephone Encounter (Signed)
Yes, since they are for different purposes, and taking both together is very low risk

## 2013-04-16 NOTE — Telephone Encounter (Signed)
Nursing home informed of MD instructions on medication.

## 2013-04-16 NOTE — Telephone Encounter (Signed)
Beth Gutierrez from patient's assisted living facility, phoned to clarify and confirm-patient has both pylera and flagyl ordered.  Since Pylera has flagyl in it, does PCP wish for patient to be on BOTH meds or one or the other?  Please advise.  CB# [phone]

## 2013-04-23 ENCOUNTER — Ambulatory Visit: Payer: Medicare Other | Admitting: Internal Medicine

## 2013-04-29 ENCOUNTER — Telehealth: Payer: Self-pay | Admitting: Physician Assistant

## 2013-04-29 ENCOUNTER — Encounter: Payer: Self-pay | Admitting: Internal Medicine

## 2013-04-29 ENCOUNTER — Ambulatory Visit (INDEPENDENT_AMBULATORY_CARE_PROVIDER_SITE_OTHER): Payer: Medicare Other | Admitting: Internal Medicine

## 2013-04-29 VITALS — BP 110/60 | HR 68 | Temp 98.1°F

## 2013-04-29 DIAGNOSIS — R197 Diarrhea, unspecified: Secondary | ICD-10-CM

## 2013-04-29 DIAGNOSIS — K529 Noninfective gastroenteritis and colitis, unspecified: Secondary | ICD-10-CM

## 2013-04-29 DIAGNOSIS — I10 Essential (primary) hypertension: Secondary | ICD-10-CM

## 2013-04-29 DIAGNOSIS — I5022 Chronic systolic (congestive) heart failure: Secondary | ICD-10-CM

## 2013-04-29 MED ORDER — DIPHENOXYLATE-ATROPINE 2.5-0.025 MG PO TABS: 1.0000 | ORAL_TABLET | Freq: Two times a day (BID) | ORAL | Status: DC | PRN

## 2013-04-29 NOTE — Assessment & Plan Note (Signed)
Exam benign, s/p several meds apparently not working well; s/p pylera, to d/c bentyl, colace, florastor; cont PPI for now due to recent PUD/gastritis; lomotil prn, stool studies,  to f/u any worsening symptoms or concerns

## 2013-04-29 NOTE — Patient Instructions (Signed)
OK to stop the bentyl, colace, flagyl, pylera, fish oil and florastor since these do not seem to be helping  OK to change the tylenol to as needed for pain  OK for Lomotil as needed for diarrhea  Please go to the LAB in the Basement (turn left off the elevator) for the tests to be done today - the stool tests  You may wish to make appt with Dr Henrene Pastor if the Diarrhea does not improve in 1-2 wks  Please return in 6 months, or sooner if needed

## 2013-04-29 NOTE — Progress Notes (Signed)
Pre visit review using our clinic review tool, if applicable. No additional management support is needed unless otherwise documented below in the visit note. 

## 2013-04-29 NOTE — Assessment & Plan Note (Signed)
stable overall by history and exam, recent data reviewed with pt, and pt to continue medical treatment as before,  to f/u any worsening symptoms or concerns BP Readings from Last 3 Encounters:  04/29/13 110/60  04/15/13 100/60  04/08/13 130/58

## 2013-04-29 NOTE — Progress Notes (Signed)
Subjective:    Patient ID: Beth Gutierrez, female    DOB: Sep 11, 1921, 80 y.o.   MRN: [id number]  HPI  Here with caretaker who adamantly c/o polypharmacy, demands for as many meds to be stopped as possible, perhaps worse recently after recent tx with several different meds per GI.  Unfortunately, pt also with diarrhea simply not better per caretaker who has to clean up several times per day, and states actually has been intermittent for 1.5 yrs.  Pt denies fever, wt loss, night sweats, loss of appetite, or other constitutional symptoms  Pt denies chest pain, increased sob or doe, wheezing, orthopnea, PND, increased LE swelling, palpitations, dizziness or syncope. Past Medical History  Diagnosis Date  . Acute myocardial infarction, unspecified site, episode of care unspecified   . Congestive heart failure, unspecified   . Coronary atherosclerosis of unspecified type of vessel, native or graft   . Unspecified essential hypertension   . Other and unspecified hyperlipidemia   . Esophageal reflux   . Depressive disorder, not elsewhere classified   . Morbid obesity   . Need for prophylactic vaccination and inoculation against influenza   . Allergic rhinitis, cause unspecified   . Other malaise and fatigue   . Disorder of bone and cartilage, unspecified   . Diverticulosis of colon (without mention of hemorrhage)   . Benign neoplasm of colon   . Gout, unspecified   . Unspecified glaucoma   . Osteoarthrosis, unspecified whether generalized or localized, unspecified site   . Gout flare 02/06/2011  . Anemia, unspecified 04/05/2011  . Pneumonia   . Headache(784.0)   . Acute duodenal ulcer with hemorrhage 03/26/2013   Past Surgical History  Procedure Laterality Date  . Coronary angioplasty with stent placement      RCA stent  . Vesicovaginal fistula closure w/ tah    . Left hip replacement      s/p 2002. Secondary to DJD  . Ectopic pregnancy surgery    . Esophagogastroduodenoscopy N/A  03/26/2013    Procedure: ESOPHAGOGASTRODUODENOSCOPY (EGD);  Surgeon: Gatha Mayer, MD;  Location: [facility] ENDOSCOPY;  Service: Endoscopy;  Laterality: N/A;    reports that she quit smoking about 22 years ago. She has never used smokeless tobacco. She reports that she does not drink alcohol or use illicit drugs. family history includes Breast cancer in her sister; Hypertension in her father; Stroke in her father. There is no history of Colon cancer. Allergies  Allergen Reactions  . Sulfonamide Derivatives Other (See Comments)    unknown   Current Outpatient Prescriptions on File Prior to Visit  Medication Sig Dispense Refill  . acetaminophen (TYLENOL) 325 MG tablet Take 650 mg by mouth 3 (three) times daily.      Marland Kitchen allopurinol (ZYLOPRIM) 100 MG tablet Take 1 tablet (100 mg total) by mouth daily.  90 tablet  3  . aspirin 81 MG tablet Take 81 mg by mouth daily.      Marland Kitchen atorvastatin (LIPITOR) 80 MG tablet Take 1 tablet (80 mg total) by mouth daily at 6 PM.      . cholecalciferol (VITAMIN D) 1000 UNITS tablet Take 1,000 Units by mouth daily.      . citalopram (CELEXA) 10 MG tablet Take 1 tablet (10 mg total) by mouth daily.  90 tablet  3  . ferrous sulfate 325 (65 FE) MG tablet Take 325 mg by mouth 2 (two) times daily.       . furosemide (LASIX) 20 MG tablet Take 20  mg by mouth daily.      . Glucosamine 500 MG CAPS Take by mouth daily.      Marland Kitchen HYDROcodone-homatropine (HYDROMET) 5-1.5 MG/5ML syrup Take 5 mLs by mouth every 6 (six) hours as needed for cough.      . methocarbamol (ROBAXIN) 500 MG tablet Take 500 mg by mouth 3 (three) times daily as needed for muscle spasms.       . metoprolol tartrate (LOPRESSOR) 25 MG tablet Take 12.5 mg by mouth 2 (two) times daily.      . ondansetron (ZOFRAN) 4 MG tablet Take 1 tablet (4 mg total) by mouth every 8 (eight) hours as needed for nausea.  30 tablet  4  . pantoprazole (PROTONIX) 40 MG tablet Take 1 tablet (40 mg total) by mouth daily before breakfast.  30  tablet  0  . traMADol (ULTRAM) 50 MG tablet Take 1 tablet (50 mg total) by mouth every 8 (eight) hours as needed (pain).  30 tablet  0  . Turmeric 450 MG CAPS Take 450 mg by mouth daily.        No current facility-administered medications on file prior to visit.   Review of Systems  Constitutional: Negative for unexpected weight change, or unusual diaphoresis  HENT: Negative for tinnitus.   Eyes: Negative for photophobia and visual disturbance.  Respiratory: Negative for choking and stridor.   Gastrointestinal: Negative for vomiting and blood in stool.  Genitourinary: Negative for hematuria and decreased urine volume.  Musculoskeletal: Negative for acute joint swelling Skin: Negative for color change and wound.  Neurological: Negative for tremors and numbness other than noted  Psychiatric/Behavioral: Negative for decreased concentration or  hyperactivity.       Objective:   Physical Exam BP 110/60  Pulse 68  Temp(Src) 98.1 F (36.7 C) (Oral)  SpO2 97% VS noted, obese, not ill appearing Constitutional: Pt appears well-developed and well-nourished.  HENT: Head: NCAT.  Right Ear: External ear normal.  Left Ear: External ear normal.  Eyes: Conjunctivae and EOM are normal. Pupils are equal, round, and reactive to light.  Neck: Normal range of motion. Neck supple.  Cardiovascular: Normal rate and regular rhythm.   Pulmonary/Chest: Effort normal and breath sounds normal.  Abd:  Soft, NT, non-distended, + BS Neurological: Pt is alert. Motor grossly intact Skin: Skin is warm. No erythema.  Psychiatric: Pt behavior is normal..     Assessment & Plan:

## 2013-04-29 NOTE — Assessment & Plan Note (Signed)
stable overall by history and exam, recent data reviewed with pt, and pt to continue medical treatment as before,  to f/u any worsening symptoms or concerns Lab Results  Component Value Date   WBC 8.3 04/15/2013   HGB 10.8* 04/15/2013   HCT 34.2* 04/15/2013   PLT 259.0 04/15/2013   GLUCOSE 76 03/26/2013   CHOL 132 02/25/2013   TRIG 72.0 02/25/2013   HDL 57.30 02/25/2013   LDLCALC 60 02/25/2013   ALT 14 03/24/2013   AST 29 03/24/2013   NA 146 03/26/2013   K 4.0 03/26/2013   CL 111 03/26/2013   CREATININE 1.16* 03/26/2013   BUN 19 03/26/2013   CO2 23 03/26/2013   TSH 1.05 02/25/2013   INR 1.08 09/10/2012   HGBA1C 5.2 09/10/2012

## 2013-04-29 NOTE — Telephone Encounter (Signed)
Spoke with patient's caregiver. She states the patient continues to have uncontrolled diarrhea. Nothing has helped it. Patient saw Dr. Jenny Reichmann today and was given Lomotil. Dr. Jenny Reichmann suggested she return to Dr. Henrene Pastor per caregiver. Scheduled on 05/08/13 at 10:45 AM.

## 2013-04-30 ENCOUNTER — Other Ambulatory Visit: Payer: Self-pay | Admitting: *Deleted

## 2013-04-30 NOTE — Telephone Encounter (Signed)
Spoke with Caregiver and gave her recommendations. Called [facility] and requested Tia the nurse call me. Faxed order for stool pathogen panel to them at [phone]

## 2013-04-30 NOTE — Telephone Encounter (Signed)
Spoke with Tia at [facility] and told her I have faxed over a request for Gi pathogen panel. She reviewed the patient's medication list and she is not on Colace or any other medications for constipation. Spoke with Nicoletta Ba, PA and patient can just stay on Lomotil BID prn as per Dr. Ronnald Ramp.

## 2013-04-30 NOTE — Telephone Encounter (Signed)
We need a stool pathogen panel-make sure they are not giving her colace etc-lomotil  One each am is ok

## 2013-05-02 ENCOUNTER — Ambulatory Visit: Payer: Medicare Other | Admitting: Internal Medicine

## 2013-05-05 ENCOUNTER — Telehealth: Payer: Self-pay

## 2013-05-05 NOTE — Telephone Encounter (Signed)
[place] to see if results were back on Gi Pathogen panel that had been ordered at patient's previous visit; I was told by Alyse Low they were not back yet, but would be faxed to me as soon as she got them

## 2013-05-08 ENCOUNTER — Encounter: Payer: Self-pay | Admitting: Internal Medicine

## 2013-05-08 ENCOUNTER — Ambulatory Visit (INDEPENDENT_AMBULATORY_CARE_PROVIDER_SITE_OTHER): Payer: Medicare Other | Admitting: Internal Medicine

## 2013-05-08 VITALS — BP 142/70 | HR 68

## 2013-05-08 DIAGNOSIS — K264 Chronic or unspecified duodenal ulcer with hemorrhage: Secondary | ICD-10-CM

## 2013-05-08 DIAGNOSIS — K922 Gastrointestinal hemorrhage, unspecified: Secondary | ICD-10-CM

## 2013-05-08 DIAGNOSIS — R197 Diarrhea, unspecified: Secondary | ICD-10-CM

## 2013-05-08 NOTE — Patient Instructions (Signed)
Please follow up with Dr. Perry as needed 

## 2013-05-08 NOTE — Progress Notes (Signed)
HISTORY OF PRESENT ILLNESS:  Beth Gutierrez is a 80 y.o. female with multiple medical problems as outlined. She presents for followup. She was hospitalized in February with melena. Upper endoscopy revealed duodenal bulb ulcer and erosive gastritis. She was treated with PPI. H. pylori testing positive. Treated with Pylera. Subsequently seen in a few weeks ago for diarrhea. Felt to possibly have Clostridium difficile. Treated empirically with metronidazole. Stool studies ordered but not completed. Given followup appointment at that time. Patient is please report that her diarrhea has resolved. Her bowel habits are described as normal. She is confused as to why she had dark stools on several occasions. I discussed this with her. No active GI complaints.  REVIEW OF SYSTEMS:  All non-GI ROS negative except for arthritis  Past Medical History  Diagnosis Date  . Acute myocardial infarction, unspecified site, episode of care unspecified   . Congestive heart failure, unspecified   . Coronary atherosclerosis of unspecified type of vessel, native or graft   . Unspecified essential hypertension   . Other and unspecified hyperlipidemia   . Esophageal reflux   . Depressive disorder, not elsewhere classified   . Morbid obesity   . Need for prophylactic vaccination and inoculation against influenza   . Allergic rhinitis, cause unspecified   . Other malaise and fatigue   . Disorder of bone and cartilage, unspecified   . Diverticulosis of colon (without mention of hemorrhage)   . Benign neoplasm of colon   . Gout, unspecified   . Unspecified glaucoma   . Osteoarthrosis, unspecified whether generalized or localized, unspecified site   . Gout flare 02/06/2011  . Anemia, unspecified 04/05/2011  . Pneumonia   . Headache(784.0)   . Acute duodenal ulcer with hemorrhage 03/26/2013    Past Surgical History  Procedure Laterality Date  . Coronary angioplasty with stent placement      RCA stent  .  Vesicovaginal fistula closure w/ tah    . Left hip replacement      s/p 2002. Secondary to DJD  . Ectopic pregnancy surgery    . Esophagogastroduodenoscopy N/A 03/26/2013    Procedure: ESOPHAGOGASTRODUODENOSCOPY (EGD);  Surgeon: Gatha Mayer, MD;  Location: [facility] ENDOSCOPY;  Service: Endoscopy;  Laterality: N/A;    Social History Timi Reeser Falwell  reports that she quit smoking about 22 years ago. She has never used smokeless tobacco. She reports that she does not drink alcohol or use illicit drugs.  family history includes Breast cancer in her sister; Hypertension in her father; Stroke in her father. There is no history of Colon cancer.  Allergies  Allergen Reactions  . Sulfonamide Derivatives Other (See Comments)    unknown       PHYSICAL EXAMINATION: Vital signs: BP 142/70  Pulse 68 General: Well-developed, obese, well-nourished, no acute distress HEENT: Sclerae are anicteric, conjunctiva pink. Oral mucosa intact Lungs: Clear Heart: Regular Abdomen: soft, obese, nontender, nondistended, no obvious ascites, no peritoneal signs, normal bowel sounds. No organomegaly. Extremities: Trace edema Psychiatric: alert and oriented x3. Cooperative   ASSESSMENT:  #1. Recent problems with diarrhea possibly related to antibiotics and/or C. difficile. Improved after peer course of metronidazole #2. History of duodenal ulcer and H. pylori positive status post treatment #3. General medical problems.  PLAN:  #1. Discussed with patient her conditions and rationale behind treatments #2. Continue PPI #3. GI followup as needed

## 2013-06-19 ENCOUNTER — Ambulatory Visit: Payer: Medicare Other | Admitting: Internal Medicine

## 2013-07-02 DIAGNOSIS — Z0279 Encounter for issue of other medical certificate: Secondary | ICD-10-CM

## 2013-07-31 ENCOUNTER — Ambulatory Visit (INDEPENDENT_AMBULATORY_CARE_PROVIDER_SITE_OTHER): Payer: Medicare Other | Admitting: Internal Medicine

## 2013-07-31 ENCOUNTER — Encounter: Payer: Self-pay | Admitting: Internal Medicine

## 2013-07-31 VITALS — BP 122/72 | HR 65 | Temp 98.5°F

## 2013-07-31 DIAGNOSIS — F329 Major depressive disorder, single episode, unspecified: Secondary | ICD-10-CM

## 2013-07-31 DIAGNOSIS — J069 Acute upper respiratory infection, unspecified: Secondary | ICD-10-CM | POA: Insufficient documentation

## 2013-07-31 DIAGNOSIS — F3289 Other specified depressive episodes: Secondary | ICD-10-CM

## 2013-07-31 DIAGNOSIS — I10 Essential (primary) hypertension: Secondary | ICD-10-CM

## 2013-07-31 MED ORDER — AZITHROMYCIN 250 MG PO TABS: ORAL_TABLET | ORAL | Status: DC

## 2013-07-31 NOTE — Assessment & Plan Note (Signed)
stable overall by history and exam, and pt to continue medical treatment as before,  to f/u any worsening symptoms or concerns 

## 2013-07-31 NOTE — Assessment & Plan Note (Signed)
stable overall by history and exam, recent data reviewed with pt, and pt to continue medical treatment as before,  to f/u any worsening symptoms or concerns BP Readings from Last 3 Encounters:  07/31/13 122/72  05/08/13 142/70  04/29/13 110/60

## 2013-07-31 NOTE — Assessment & Plan Note (Signed)
Mild to mod, for antibx course,  to f/u any worsening symptoms or concerns 

## 2013-07-31 NOTE — Progress Notes (Signed)
Pre visit review using our clinic review tool, if applicable. No additional management support is needed unless otherwise documented below in the visit note. 

## 2013-07-31 NOTE — Patient Instructions (Signed)
Please take all new medication as prescribed  Please continue all other medications as before, and refills have been done if requested.  Please have the pharmacy call with any other refills you may need.  Please continue your efforts at being more active, low cholesterol diet, and weight control.  Please keep your appointments with your specialists as you may have planned    

## 2013-07-31 NOTE — Progress Notes (Signed)
Subjective:    Patient ID: Beth Gutierrez, female    DOB: May 13, 1921, 80 y.o.   MRN: [id number]  HPI   Here with 2-3 days acute onset fever, severe ST, pressure, headache, general weakness and malaise, with mild nonprod cough, but pt denies chest pain, wheezing, increased sob or doe, orthopnea, PND, increased LE swelling, palpitations, dizziness or syncope. Pt denies new neurological symptoms such as new headache, or facial or extremity weakness or numbness   Pt denies polydipsia, polyuria, Denies worsening depressive symptoms, suicidal ideation, or panic Past Medical History  Diagnosis Date  . Acute myocardial infarction, unspecified site, episode of care unspecified   . Congestive heart failure, unspecified   . Coronary atherosclerosis of unspecified type of vessel, native or graft   . Unspecified essential hypertension   . Other and unspecified hyperlipidemia   . Esophageal reflux   . Depressive disorder, not elsewhere classified   . Morbid obesity   . Need for prophylactic vaccination and inoculation against influenza   . Allergic rhinitis, cause unspecified   . Other malaise and fatigue   . Disorder of bone and cartilage, unspecified   . Diverticulosis of colon (without mention of hemorrhage)   . Benign neoplasm of colon   . Gout, unspecified   . Unspecified glaucoma   . Osteoarthrosis, unspecified whether generalized or localized, unspecified site   . Gout flare 02/06/2011  . Anemia, unspecified 04/05/2011  . Pneumonia   . Headache(784.0)   . Acute duodenal ulcer with hemorrhage 03/26/2013   Past Surgical History  Procedure Laterality Date  . Coronary angioplasty with stent placement      RCA stent  . Vesicovaginal fistula closure w/ tah    . Left hip replacement      s/p 2002. Secondary to DJD  . Ectopic pregnancy surgery    . Esophagogastroduodenoscopy N/A 03/26/2013    Procedure: ESOPHAGOGASTRODUODENOSCOPY (EGD);  Surgeon: Gatha Mayer, MD;  Location: [facility]  ENDOSCOPY;  Service: Endoscopy;  Laterality: N/A;    reports that she quit smoking about 23 years ago. She has never used smokeless tobacco. She reports that she does not drink alcohol or use illicit drugs. family history includes Breast cancer in her sister; Hypertension in her father; Stroke in her father. There is no history of Colon cancer. Allergies  Allergen Reactions  . Sulfonamide Derivatives Other (See Comments)    unknown   Current Outpatient Prescriptions on File Prior to Visit  Medication Sig Dispense Refill  . acetaminophen (TYLENOL) 325 MG tablet Take 650 mg by mouth 3 (three) times daily.      Marland Kitchen allopurinol (ZYLOPRIM) 100 MG tablet Take 1 tablet (100 mg total) by mouth daily.  90 tablet  3  . aspirin 81 MG tablet Take 81 mg by mouth daily.      Marland Kitchen atorvastatin (LIPITOR) 80 MG tablet Take 1 tablet (80 mg total) by mouth daily at 6 PM.      . cholecalciferol (VITAMIN D) 1000 UNITS tablet Take 1,000 Units by mouth daily.      . citalopram (CELEXA) 10 MG tablet Take 1 tablet (10 mg total) by mouth daily.  90 tablet  3  . diphenoxylate-atropine (LOMOTIL) 2.5-0.025 MG per tablet Take 1 tablet by mouth 2 (two) times daily as needed for diarrhea or loose stools.  40 tablet  1  . Elastic Bandages & Supports (JOBST RELIEF 20-30MMHG LG) MISC by Does not apply route.      . ferrous sulfate 325 (65  FE) MG tablet Take 325 mg by mouth 2 (two) times daily.       . furosemide (LASIX) 20 MG tablet Take 20 mg by mouth daily.      . Glucosamine 500 MG CAPS Take by mouth daily.      . Loperamide HCl (IMODIUM PO) Take 1 tablet by mouth as needed.      . methocarbamol (ROBAXIN) 500 MG tablet Take 500 mg by mouth 3 (three) times daily as needed for muscle spasms.       . metoprolol tartrate (LOPRESSOR) 25 MG tablet Take 12.5 mg by mouth 2 (two) times daily.      . ondansetron (ZOFRAN) 4 MG tablet Take 1 tablet (4 mg total) by mouth every 8 (eight) hours as needed for nausea.  30 tablet  4  .  pantoprazole (PROTONIX) 40 MG tablet Take 1 tablet (40 mg total) by mouth daily before breakfast.  30 tablet  0  . traMADol (ULTRAM) 50 MG tablet Take 1 tablet (50 mg total) by mouth every 8 (eight) hours as needed (pain).  30 tablet  0  . Turmeric 450 MG CAPS Take 450 mg by mouth daily.        No current facility-administered medications on file prior to visit.   Review of Systems  Constitutional: Negative for unusual diaphoresis or other sweats  HENT: Negative for ringing in ear Eyes: Negative for double vision or worsening visual disturbance.  Respiratory: Negative for choking and stridor.   Gastrointestinal: Negative for vomiting or other signifcant bowel change Genitourinary: Negative for hematuria or decreased urine volume.  Musculoskeletal: Negative for other MSK pain or swelling Skin: Negative for color change and worsening wound.  Neurological: Negative for tremors and numbness other than noted  Psychiatric/Behavioral: Negative for decreased concentration or agitation other than above       Objective:   Physical Exam VS noted, mild ill Constitutional: Pt appears well-developed, well-nourished.  HENT: Head: NCAT.  Right Ear: External ear normal.  Left Ear: External ear normal.  Bilat tm's with mild erythema.  Max sinus areas non tender.  Pharynx with severe erythema, no exudate Eyes: . Pupils are equal, round, and reactive to light. Conjunctivae and EOM are normal Neck: Normal range of motion. Neck supple.  Cardiovascular: Normal rate and regular rhythm.   Pulmonary/Chest: Effort normal and breath sounds normal.  - no rales or wheezing Neurological: Pt is alert. Not confused , motor grossly intact Skin: Skin is warm. No rash Psychiatric: Pt behavior is normal. No agitation. not depressed affect    Assessment & Plan:

## 2013-08-25 ENCOUNTER — Telehealth: Payer: Self-pay | Admitting: Internal Medicine

## 2013-08-25 NOTE — Telephone Encounter (Signed)
[employer] from [employer] is calling to request an order for a 'ted hose'  to have them put on in the a.m. And taken off in the p.m. daily.  Order can be sent to Fax#: [phone].

## 2013-08-26 NOTE — Telephone Encounter (Signed)
rx given hardcopy to robin

## 2013-08-27 NOTE — Telephone Encounter (Signed)
Faxed rx at given fax number.

## 2013-10-10 ENCOUNTER — Other Ambulatory Visit: Payer: Self-pay | Admitting: Internal Medicine

## 2013-10-10 MED ORDER — NYSTATIN 100000 UNIT/GM EX POWD: CUTANEOUS | Status: DC

## 2013-10-28 ENCOUNTER — Ambulatory Visit: Payer: Medicare Other | Admitting: Internal Medicine

## 2013-12-03 ENCOUNTER — Encounter: Payer: Self-pay | Admitting: Internal Medicine

## 2013-12-29 ENCOUNTER — Telehealth: Payer: Self-pay | Admitting: Internal Medicine

## 2013-12-29 NOTE — Telephone Encounter (Signed)
No note is needed  Please look at [employer], and I believe on the back is a box to check regarding this

## 2013-12-29 NOTE — Telephone Encounter (Signed)
Called pt daughter back no answer LMOM with md response.../lmb 

## 2013-12-29 NOTE — Telephone Encounter (Signed)
Requesting a note for exemption from jury duty in December. Pt is 80 yrs old and now in assisted living facility. Pls let daugher know if/when can pick up [phone]

## 2014-01-22 ENCOUNTER — Encounter (HOSPITAL_COMMUNITY): Payer: Self-pay | Admitting: Cardiology

## 2014-01-24 ENCOUNTER — Emergency Department (HOSPITAL_COMMUNITY)
Admission: EM | Admit: 2014-01-24 | Discharge: 2014-01-24 | Disposition: A | Discharge status: Home / Self Care | Payer: Medicare Other | Attending: Emergency Medicine | Admitting: Emergency Medicine

## 2014-01-24 ENCOUNTER — Encounter (HOSPITAL_COMMUNITY): Payer: Self-pay

## 2014-01-24 DIAGNOSIS — Z85038 Personal history of other malignant neoplasm of large intestine: Secondary | ICD-10-CM | POA: Insufficient documentation

## 2014-01-24 DIAGNOSIS — E785 Hyperlipidemia, unspecified: Secondary | ICD-10-CM | POA: Insufficient documentation

## 2014-01-24 DIAGNOSIS — I2510 Atherosclerotic heart disease of native coronary artery without angina pectoris: Secondary | ICD-10-CM | POA: Diagnosis not present

## 2014-01-24 DIAGNOSIS — Z87891 Personal history of nicotine dependence: Secondary | ICD-10-CM | POA: Insufficient documentation

## 2014-01-24 DIAGNOSIS — E860 Dehydration: Secondary | ICD-10-CM | POA: Diagnosis not present

## 2014-01-24 DIAGNOSIS — Z8701 Personal history of pneumonia (recurrent): Secondary | ICD-10-CM | POA: Diagnosis not present

## 2014-01-24 DIAGNOSIS — I509 Heart failure, unspecified: Secondary | ICD-10-CM | POA: Diagnosis not present

## 2014-01-24 DIAGNOSIS — Z7982 Long term (current) use of aspirin: Secondary | ICD-10-CM | POA: Insufficient documentation

## 2014-01-24 DIAGNOSIS — Z791 Long term (current) use of non-steroidal anti-inflammatories (NSAID): Secondary | ICD-10-CM | POA: Insufficient documentation

## 2014-01-24 DIAGNOSIS — D649 Anemia, unspecified: Secondary | ICD-10-CM | POA: Insufficient documentation

## 2014-01-24 DIAGNOSIS — I10 Essential (primary) hypertension: Secondary | ICD-10-CM | POA: Insufficient documentation

## 2014-01-24 DIAGNOSIS — I252 Old myocardial infarction: Secondary | ICD-10-CM | POA: Insufficient documentation

## 2014-01-24 DIAGNOSIS — M109 Gout, unspecified: Secondary | ICD-10-CM | POA: Diagnosis not present

## 2014-01-24 DIAGNOSIS — R4182 Altered mental status, unspecified: Secondary | ICD-10-CM | POA: Diagnosis present

## 2014-01-24 DIAGNOSIS — F329 Major depressive disorder, single episode, unspecified: Secondary | ICD-10-CM | POA: Diagnosis not present

## 2014-01-24 DIAGNOSIS — M1990 Unspecified osteoarthritis, unspecified site: Secondary | ICD-10-CM | POA: Diagnosis not present

## 2014-01-24 DIAGNOSIS — K219 Gastro-esophageal reflux disease without esophagitis: Secondary | ICD-10-CM | POA: Insufficient documentation

## 2014-01-24 LAB — URINALYSIS, ROUTINE W REFLEX MICROSCOPIC
Bilirubin Urine: NEGATIVE
Glucose, UA: NEGATIVE mg/dL
Hgb urine dipstick: NEGATIVE
Ketones, ur: NEGATIVE mg/dL
Leukocytes, UA: NEGATIVE
Nitrite: NEGATIVE
Protein, ur: 30 mg/dL — AB
Specific Gravity, Urine: 1.02 (ref 1.005–1.030)
Urobilinogen, UA: 0.2 mg/dL (ref 0.0–1.0)
pH: 5 (ref 5.0–8.0)

## 2014-01-24 LAB — CBC WITH DIFFERENTIAL/PLATELET
Basophils Absolute: 0 10*3/uL (ref 0.0–0.1)
Basophils Relative: 0 % (ref 0–1)
Eosinophils Absolute: 0.1 10*3/uL (ref 0.0–0.7)
Eosinophils Relative: 1 % (ref 0–5)
HCT: 33.6 % — ABNORMAL LOW (ref 36.0–46.0)
Hemoglobin: 10.6 g/dL — ABNORMAL LOW (ref 12.0–15.0)
Lymphocytes Relative: 15 % (ref 12–46)
Lymphs Abs: 1.5 10*3/uL (ref 0.7–4.0)
MCH: 31.3 pg (ref 26.0–34.0)
MCHC: 31.5 g/dL (ref 30.0–36.0)
MCV: 99.1 fL (ref 78.0–100.0)
Monocytes Absolute: 0.8 10*3/uL (ref 0.1–1.0)
Monocytes Relative: 8 % (ref 3–12)
Neutro Abs: 7.4 10*3/uL (ref 1.7–7.7)
Neutrophils Relative %: 76 % (ref 43–77)
Platelets: 218 10*3/uL (ref 150–400)
RBC: 3.39 MIL/uL — ABNORMAL LOW (ref 3.87–5.11)
RDW: 16.3 % — ABNORMAL HIGH (ref 11.5–15.5)
WBC: 9.7 10*3/uL (ref 4.0–10.5)

## 2014-01-24 LAB — BASIC METABOLIC PANEL
Anion gap: 15 (ref 5–15)
BUN: 36 mg/dL — ABNORMAL HIGH (ref 6–23)
CO2: 23 mEq/L (ref 19–32)
Calcium: 8.9 mg/dL (ref 8.4–10.5)
Chloride: 105 mEq/L (ref 96–112)
Creatinine, Ser: 1.41 mg/dL — ABNORMAL HIGH (ref 0.50–1.10)
GFR calc Af Amer: 37 mL/min — ABNORMAL LOW (ref >90)
GFR calc non Af Amer: 32 mL/min — ABNORMAL LOW (ref >90)
Glucose, Bld: 95 mg/dL (ref 70–99)
Potassium: 4.3 mEq/L (ref 3.7–5.3)
Sodium: 143 mEq/L (ref 137–147)

## 2014-01-24 LAB — URINE MICROSCOPIC-ADD ON

## 2014-01-24 MED ORDER — SODIUM CHLORIDE 0.9 % IV BOLUS (SEPSIS)
500.0000 mL | Freq: Once | INTRAVENOUS | Status: AC
  Administered 2014-01-24: 500 mL via INTRAVENOUS

## 2014-01-24 NOTE — ED Notes (Addendum)
Per EMS, Patient was at a [facility] and at 0830 patient was seen at baseline. At 1130, Patient was seen at lunch with interaction normally, but staff noted she was clammy. At 1315, Patient walked out of her room and utilized walker to go out to the nurses station complaining of chest pain. Patient normally uses the call [facility] and the staff wheels the patient around in a wheelchair. Patient presented confused to the nursing staff. Patient alert upon arrival. Vitals per EMS: 132 CBG, 96% on RA, 142/70, 74 HR. Patient denies pain. Patient has history of mild dementia, but is not at baseline along with Hx of CHF, HTN, CAD.

## 2014-01-24 NOTE — ED Provider Notes (Signed)
1700 - 40F here with confusion, acting a little more loopy. Here with stable vitals, receiving fluids for mild dehydration. Awaiting urine. Care from Dr. Wilson Singer. Urine negative. Stable for discharge.   1. Dehydration      Evelina Bucy, MD 01/24/14 [phone]

## 2014-01-24 NOTE — Discharge Instructions (Signed)
Dehydration, Adult Dehydration is when you lose more fluids from the body than you take in. Vital organs like the kidneys, brain, and heart cannot function without a proper amount of fluids and salt. Any loss of fluids from the body can cause dehydration.  CAUSES   Vomiting.  Diarrhea.  Excessive sweating.  Excessive urine output.  Fever. SYMPTOMS  Mild dehydration  Thirst.  Dry lips.  Slightly dry mouth. Moderate dehydration  Very dry mouth.  Sunken eyes.  Skin does not bounce back quickly when lightly pinched and released.  Dark urine and decreased urine production.  Decreased tear production.  Headache. Severe dehydration  Very dry mouth.  Extreme thirst.  Rapid, weak pulse (more than 100 beats per minute at rest).  Cold hands and feet.  Not able to sweat in spite of heat and temperature.  Rapid breathing.  Blue lips.  Confusion and lethargy.  Difficulty being awakened.  Minimal urine production.  No tears. DIAGNOSIS  Your caregiver will diagnose dehydration based on your symptoms and your exam. Blood and urine tests will help confirm the diagnosis. The diagnostic evaluation should also identify the cause of dehydration. TREATMENT  Treatment of mild or moderate dehydration can often be done at home by increasing the amount of fluids that you drink. It is best to drink small amounts of fluid more often. Drinking too much at one time can make vomiting worse. Refer to the home care instructions below. Severe dehydration needs to be treated at the hospital where you will probably be given intravenous (IV) fluids that contain water and electrolytes. HOME CARE INSTRUCTIONS   Ask your caregiver about specific rehydration instructions.  Drink enough fluids to keep your urine clear or pale yellow.  Drink small amounts frequently if you have nausea and vomiting.  Eat as you normally do.  Avoid:  Foods or drinks high in sugar.  Carbonated  drinks.  Juice.  Extremely hot or cold fluids.  Drinks with caffeine.  Fatty, greasy foods.  Alcohol.  Tobacco.  Overeating.  Gelatin desserts.  Wash your hands well to avoid spreading bacteria and viruses.  Only take over-the-counter or prescription medicines for pain, discomfort, or fever as directed by your caregiver.  Ask your caregiver if you should continue all prescribed and over-the-counter medicines.  Keep all follow-up appointments with your caregiver. SEEK MEDICAL CARE IF:  You have abdominal pain and it increases or stays in one area (localizes).  You have a rash, stiff neck, or severe headache.  You are irritable, sleepy, or difficult to awaken.  You are weak, dizzy, or extremely thirsty. SEEK IMMEDIATE MEDICAL CARE IF:   You are unable to keep fluids down or you get worse despite treatment.  You have frequent episodes of vomiting or diarrhea.  You have blood or green matter (bile) in your vomit.  You have blood in your stool or your stool looks black and tarry.  You have not urinated in 6 to 8 hours, or you have only urinated a small amount of very dark urine.  You have a fever.  You faint. MAKE SURE YOU:   Understand these instructions.  Will watch your condition.  Will get help right away if you are not doing well or get worse. Document Released: 01/30/2005 Document Revised: 04/24/2011 Document Reviewed: 09/19/2010 Exit[employer] Patient Information 2015 [employer]. This information is not intended to replace advice given to you by your health care provider. Make sure you discuss any questions you have with your health care   provider.  

## 2014-01-24 NOTE — ED Notes (Signed)
[facility] arrived to take patient. Patient dressed and ready. Patient and family ready for transport. Paperwork, Patient belongings, and Patient given to [facility].

## 2014-01-24 NOTE — ED Provider Notes (Signed)
CSN: [id number]     Arrival date & time 01/24/14  1452 History   First MD Initiated Contact with Patient 01/24/14 1505     Chief Complaint  Patient presents with  . Altered Mental Status     (Consider location/radiation/quality/duration/timing/severity/associated sxs/prior Treatment) HPI   80yF sent from NH for evaluation in a change in her mental status. Noted by staff earlier today. Apparently she was also complaining of CP. Pt denies this to me though. Denies any acute pain anywhere. No SOB. No fever or chills. No n/v. No urinary complaints. No recent trauma or medication changes reported.   Past Medical History  Diagnosis Date  . Acute myocardial infarction, unspecified site, episode of care unspecified   . Congestive heart failure, unspecified   . Coronary atherosclerosis of unspecified type of vessel, native or graft   . Unspecified essential hypertension   . Other and unspecified hyperlipidemia   . Esophageal reflux   . Depressive disorder, not elsewhere classified   . Morbid obesity   . Need for prophylactic vaccination and inoculation against influenza   . Allergic rhinitis, cause unspecified   . Other malaise and fatigue   . Disorder of bone and cartilage, unspecified   . Diverticulosis of colon (without mention of hemorrhage)   . Benign neoplasm of colon   . Gout, unspecified   . Unspecified glaucoma   . Osteoarthrosis, unspecified whether generalized or localized, unspecified site   . Gout flare 02/06/2011  . Anemia, unspecified 04/05/2011  . Pneumonia   . Headache(784.0)   . Acute duodenal ulcer with hemorrhage 03/26/2013   Past Surgical History  Procedure Laterality Date  . Coronary angioplasty with stent placement      RCA stent  . Vesicovaginal fistula closure w/ tah    . Left hip replacement      s/p 2002. Secondary to DJD  . Ectopic pregnancy surgery    . Esophagogastroduodenoscopy N/A 03/26/2013    Procedure: ESOPHAGOGASTRODUODENOSCOPY (EGD);  Surgeon:  Gatha Mayer, MD;  Location: [facility] ENDOSCOPY;  Service: Endoscopy;  Laterality: N/A;  . Left heart catheterization with coronary angiogram N/A 09/11/2012    Procedure: LEFT HEART CATHETERIZATION WITH CORONARY ANGIOGRAM;  Surgeon: Larey Dresser, MD;  Location: [facility] CATH LAB;  Service: Cardiovascular;  Laterality: N/A;   Family History  Problem Relation Age of Onset  . Breast cancer Sister   . Hypertension Father   . Stroke Father   . Colon cancer Neg Hx    History  Substance Use Topics  . Smoking status: Former Smoker    Quit date: 05/30/1990  . Smokeless tobacco: Never Used  . Alcohol Use: No   OB History    No data available     Review of Systems  All systems reviewed and negative, other than as noted in HPI.   Allergies  Sulfonamide derivatives and Tramadol  Home Medications   Prior to Admission medications   Medication Sig Start Date End Date Taking? Authorizing Provider  acetaminophen (TYLENOL) 325 MG tablet Take 650 mg by mouth 3 (three) times daily.    Historical Provider, MD  allopurinol (ZYLOPRIM) 100 MG tablet Take 1 tablet (100 mg total) by mouth daily. 06/03/12   Biagio Borg, MD  aspirin 81 MG tablet Take 81 mg by mouth daily.    Historical Provider, MD  atorvastatin (LIPITOR) 80 MG tablet Take 1 tablet (80 mg total) by mouth daily at 6 PM. 09/16/12   Thurnell Lose, MD  azithromycin [facility]  Z-PAK) 250 MG tablet Use as directed Patient not taking: Reported on 01/24/2014 07/31/13   Biagio Borg, MD  cholecalciferol (VITAMIN D) 1000 UNITS tablet Take 1,000 Units by mouth daily.    Historical Provider, MD  citalopram (CELEXA) 10 MG tablet Take 1 tablet (10 mg total) by mouth daily. 03/20/13   Biagio Borg, MD  diphenoxylate-atropine (LOMOTIL) 2.5-0.025 MG per tablet Take 1 tablet by mouth 2 (two) times daily as needed for diarrhea or loose stools. 04/29/13   Biagio Borg, MD  Elastic Bandages & Supports (JOBST RELIEF 20-30MMHG LG) MISC by Does not apply route.     Historical Provider, MD  ferrous sulfate 325 (65 FE) MG tablet Take 325 mg by mouth 2 (two) times daily.     Historical Provider, MD  furosemide (LASIX) 20 MG tablet Take 20 mg by mouth daily.    Historical Provider, MD  Glucosamine 500 MG CAPS Take by mouth daily.    Historical Provider, MD  Loperamide HCl (IMODIUM PO) Take 1 tablet by mouth as needed.    Historical Provider, MD  methocarbamol (ROBAXIN) 500 MG tablet Take 500 mg by mouth 3 (three) times daily as needed for muscle spasms.     Historical Provider, MD  metoprolol tartrate (LOPRESSOR) 25 MG tablet Take 12.5 mg by mouth 2 (two) times daily.    Historical Provider, MD  nystatin (MYCOSTATIN) powder Use as directed twice per day as needed 10/10/13   Biagio Borg, MD  ondansetron (ZOFRAN) 4 MG tablet Take 1 tablet (4 mg total) by mouth every 8 (eight) hours as needed for nausea. 08/23/12   Biagio Borg, MD  pantoprazole (PROTONIX) 40 MG tablet Take 1 tablet (40 mg total) by mouth daily before breakfast. 03/27/13   Jonetta Osgood, MD  traMADol (ULTRAM) 50 MG tablet Take 1 tablet (50 mg total) by mouth every 8 (eight) hours as needed (pain). 03/27/13   Shanker Kristeen Mans, MD  Turmeric 450 MG CAPS Take 450 mg by mouth daily.     Historical Provider, MD   BP 124/50 mmHg  Pulse 72  Temp(Src) 98.3 F (36.8 C) (Oral)  Resp 12  SpO2 98% Physical Exam  Constitutional: She appears well-developed and well-nourished. No distress.  HENT:  Head: Normocephalic and atraumatic.  Eyes: Conjunctivae are normal. Pupils are equal, round, and reactive to light. Right eye exhibits no discharge. Left eye exhibits no discharge.  Neck: Neck supple.  Cardiovascular: Normal rate, regular rhythm and normal heart sounds.  Exam reveals no gallop and no friction rub.   No murmur heard. Pulmonary/Chest: Effort normal and breath sounds normal. No respiratory distress.  Abdominal: Soft. She exhibits no distension. There is no tenderness.  Musculoskeletal: She  exhibits no edema or tenderness.  Neurological: She is alert.  Disoriented to place. Follows commands. Speech clear. Content mostly appropriate but seems to easily lose train of thought and needs some redirection. CN intact. No focal motor deficit.   Skin: Skin is warm and dry. She is not diaphoretic.  Psychiatric: She has a normal mood and affect. Her behavior is normal. Thought content normal.  Nursing note and vitals reviewed.   ED Course  Procedures (including critical care time) Labs Review Labs Reviewed  CBC WITH DIFFERENTIAL - Abnormal; Notable for the following:    RBC 3.39 (*)    Hemoglobin 10.6 (*)    HCT 33.6 (*)    RDW 16.3 (*)    All other components within normal limits  BASIC METABOLIC PANEL - Abnormal; Notable for the following:    BUN 36 (*)    Creatinine, Ser 1.41 (*)    GFR calc non Af Amer 32 (*)    GFR calc Af Amer 37 (*)    All other components within normal limits  URINALYSIS, ROUTINE W REFLEX MICROSCOPIC    Imaging Review No results found.   EKG Interpretation None      MDM   Final diagnoses:  Dehydration    80 year old female sent from nursing facility for evaluation of some changes in her mental status.  Apparently she seemed "clammy." She is afebrile.Patient has no acute complaints to me. Mildly worsened renal function which appears to be superimposed on chronic kidney disease.  Small fluid bolus.  No specifics urinary complaints. I feel appropriate for DC. Abx as indicated by UA results.    Virgel Manifold, MD 01/27/14 [phone]

## 2014-01-24 NOTE — ED Notes (Signed)
Phlebotomy at the bedside  

## 2014-02-04 ENCOUNTER — Telehealth: Payer: Self-pay | Admitting: Internal Medicine

## 2014-02-04 NOTE — Telephone Encounter (Signed)
Nursing home informed of PCP instructions on medication.

## 2014-02-04 NOTE — Telephone Encounter (Signed)
Daughter in-law calling to ask Dr Jenny Reichmann if this pt needs to continue taking Celexa? She has been diagnosed with dementia moving towards alzheimer's.  Pls call [facility] [address]/Nursing home and let them know.  [phone]

## 2014-02-04 NOTE — Telephone Encounter (Signed)
I would continue this for now, as does help also with anxiety and depression that she has had issues in the past, and often are worse with dementia

## 2014-08-10 ENCOUNTER — Other Ambulatory Visit: Payer: Self-pay

## 2014-11-27 ENCOUNTER — Encounter (HOSPITAL_COMMUNITY): Payer: Self-pay | Admitting: *Deleted

## 2014-11-27 ENCOUNTER — Emergency Department (HOSPITAL_COMMUNITY): Payer: Medicare Other

## 2014-11-27 ENCOUNTER — Emergency Department (HOSPITAL_COMMUNITY)
Admission: EM | Admit: 2014-11-27 | Discharge: 2014-11-27 | Disposition: A | Discharge status: Home / Self Care | Payer: Medicare Other | Attending: Emergency Medicine | Admitting: Emergency Medicine

## 2014-11-27 DIAGNOSIS — I252 Old myocardial infarction: Secondary | ICD-10-CM | POA: Diagnosis not present

## 2014-11-27 DIAGNOSIS — Z79899 Other long term (current) drug therapy: Secondary | ICD-10-CM | POA: Insufficient documentation

## 2014-11-27 DIAGNOSIS — I10 Essential (primary) hypertension: Secondary | ICD-10-CM | POA: Insufficient documentation

## 2014-11-27 DIAGNOSIS — E785 Hyperlipidemia, unspecified: Secondary | ICD-10-CM | POA: Insufficient documentation

## 2014-11-27 DIAGNOSIS — Z8701 Personal history of pneumonia (recurrent): Secondary | ICD-10-CM | POA: Diagnosis not present

## 2014-11-27 DIAGNOSIS — F0390 Unspecified dementia without behavioral disturbance: Secondary | ICD-10-CM | POA: Diagnosis not present

## 2014-11-27 DIAGNOSIS — R4182 Altered mental status, unspecified: Secondary | ICD-10-CM | POA: Diagnosis not present

## 2014-11-27 DIAGNOSIS — I509 Heart failure, unspecified: Secondary | ICD-10-CM | POA: Insufficient documentation

## 2014-11-27 DIAGNOSIS — K219 Gastro-esophageal reflux disease without esophagitis: Secondary | ICD-10-CM | POA: Diagnosis not present

## 2014-11-27 DIAGNOSIS — Z7982 Long term (current) use of aspirin: Secondary | ICD-10-CM | POA: Diagnosis not present

## 2014-11-27 DIAGNOSIS — Z9861 Coronary angioplasty status: Secondary | ICD-10-CM | POA: Insufficient documentation

## 2014-11-27 DIAGNOSIS — I2510 Atherosclerotic heart disease of native coronary artery without angina pectoris: Secondary | ICD-10-CM | POA: Insufficient documentation

## 2014-11-27 DIAGNOSIS — F329 Major depressive disorder, single episode, unspecified: Secondary | ICD-10-CM | POA: Diagnosis not present

## 2014-11-27 DIAGNOSIS — Z8669 Personal history of other diseases of the nervous system and sense organs: Secondary | ICD-10-CM | POA: Insufficient documentation

## 2014-11-27 DIAGNOSIS — M109 Gout, unspecified: Secondary | ICD-10-CM | POA: Insufficient documentation

## 2014-11-27 DIAGNOSIS — Z9889 Other specified postprocedural states: Secondary | ICD-10-CM | POA: Diagnosis not present

## 2014-11-27 DIAGNOSIS — D649 Anemia, unspecified: Secondary | ICD-10-CM | POA: Insufficient documentation

## 2014-11-27 DIAGNOSIS — R5383 Other fatigue: Secondary | ICD-10-CM | POA: Diagnosis present

## 2014-11-27 DIAGNOSIS — Z86018 Personal history of other benign neoplasm: Secondary | ICD-10-CM | POA: Insufficient documentation

## 2014-11-27 LAB — CBC WITH DIFFERENTIAL/PLATELET
BASOS ABS: 0 10*3/uL (ref 0.0–0.1)
Basophils Relative: 0 %
Eosinophils Absolute: 0.1 10*3/uL (ref 0.0–0.7)
Eosinophils Relative: 1 %
HEMATOCRIT: 29.2 % — AB (ref 36.0–46.0)
Hemoglobin: 9.2 g/dL — ABNORMAL LOW (ref 12.0–15.0)
Lymphocytes Relative: 9 %
Lymphs Abs: 1.3 10*3/uL (ref 0.7–4.0)
MCH: 32.2 pg (ref 26.0–34.0)
MCHC: 31.5 g/dL (ref 30.0–36.0)
MCV: 102.1 fL — AB (ref 78.0–100.0)
MONO ABS: 1.2 10*3/uL — AB (ref 0.1–1.0)
Monocytes Relative: 9 %
NEUTROS ABS: 11.1 10*3/uL — AB (ref 1.7–7.7)
Neutrophils Relative %: 81 %
Platelets: 315 10*3/uL (ref 150–400)
RBC: 2.86 MIL/uL — ABNORMAL LOW (ref 3.87–5.11)
RDW: 16.3 % — AB (ref 11.5–15.5)
WBC: 13.7 10*3/uL — ABNORMAL HIGH (ref 4.0–10.5)

## 2014-11-27 LAB — COMPREHENSIVE METABOLIC PANEL
ALK PHOS: 69 U/L (ref 38–126)
ALT: 19 U/L (ref 14–54)
AST: 25 U/L (ref 15–41)
Albumin: 3.7 g/dL (ref 3.5–5.0)
Anion gap: 9 (ref 5–15)
BILIRUBIN TOTAL: 0.4 mg/dL (ref 0.3–1.2)
BUN: 42 mg/dL — ABNORMAL HIGH (ref 6–20)
CALCIUM: 9.4 mg/dL (ref 8.9–10.3)
CO2: 25 mmol/L (ref 22–32)
Chloride: 109 mmol/L (ref 101–111)
Creatinine, Ser: 1.46 mg/dL — ABNORMAL HIGH (ref 0.44–1.00)
GFR calc Af Amer: 35 mL/min — ABNORMAL LOW (ref >60)
GFR, EST NON AFRICAN AMERICAN: 30 mL/min — AB (ref >60)
Glucose, Bld: 109 mg/dL — ABNORMAL HIGH (ref 65–99)
POTASSIUM: 4.7 mmol/L (ref 3.5–5.1)
Sodium: 143 mmol/L (ref 135–145)
TOTAL PROTEIN: 7 g/dL (ref 6.5–8.1)

## 2014-11-27 LAB — URINALYSIS, ROUTINE W REFLEX MICROSCOPIC
BILIRUBIN URINE: NEGATIVE
Glucose, UA: NEGATIVE mg/dL
KETONES UR: NEGATIVE mg/dL
Leukocytes, UA: NEGATIVE
Nitrite: NEGATIVE
Protein, ur: 100 mg/dL — AB
SPECIFIC GRAVITY, URINE: 1.018 (ref 1.005–1.030)
UROBILINOGEN UA: 0.2 mg/dL (ref 0.0–1.0)
pH: 5 (ref 5.0–8.0)

## 2014-11-27 LAB — URINE MICROSCOPIC-ADD ON

## 2014-11-27 LAB — TROPONIN I

## 2014-11-27 LAB — I-STAT CG4 LACTIC ACID, ED: LACTIC ACID, VENOUS: 1.18 mmol/L (ref 0.5–2.0)

## 2014-11-27 MED ORDER — HYDROMORPHONE HCL 1 MG/ML IJ SOLN
1.0000 mg | Freq: Once | INTRAMUSCULAR | Status: DC
  Filled 2014-11-27: qty 1

## 2014-11-27 MED ORDER — LORAZEPAM 2 MG/ML IJ SOLN
1.0000 mg | Freq: Once | INTRAMUSCULAR | Status: AC
  Administered 2014-11-27: 1 mg via INTRAVENOUS

## 2014-11-27 MED ORDER — LORAZEPAM 2 MG/ML IJ SOLN
1.0000 mg | Freq: Once | INTRAMUSCULAR | Status: AC
  Administered 2014-11-27: 1 mg via INTRAVENOUS
  Filled 2014-11-27: qty 1

## 2014-11-27 NOTE — ED Notes (Signed)
Report called to [facility], Spoke with [place].  PTAR notified.

## 2014-11-27 NOTE — ED Notes (Signed)
Bed: BW38 Expected date:  Expected time:  Means of arrival:  Comments: EMS 81 yo female from SNF, generalized weakness

## 2014-11-27 NOTE — ED Notes (Signed)
Pt has some weeks when she is very coherent and others when she is not.  No specific symptoms reported by EMS other than Facility states that she has not been as active as usual

## 2014-11-27 NOTE — ED Notes (Signed)
Patient transported to MRI 

## 2014-11-27 NOTE — ED Notes (Signed)
Family at bedside, daughter.

## 2014-11-27 NOTE — ED Provider Notes (Signed)
CSN: [id number]     Arrival date & time 11/27/14  0730 History   First MD Initiated Contact with Patient 11/27/14 [phone]     Chief Complaint  Patient presents with  . Fatigue     (Consider location/radiation/quality/duration/timing/severity/associated sxs/prior Treatment) HPI Comments: Patient sent to the emergency department from assisted living facility. Patient reportedly has been exhibiting decreased activity and level of consciousness this morning. Patient does have a history of dementia. She reportedly has waxing and waning levels of alertness and orientation. This morning, however, she was difficult to awaken. Level V Caveat due to dementia.   Past Medical History  Diagnosis Date  . Acute myocardial infarction, unspecified site, episode of care unspecified   . Congestive heart failure, unspecified   . Coronary atherosclerosis of unspecified type of vessel, native or graft   . Unspecified essential hypertension   . Other and unspecified hyperlipidemia   . Esophageal reflux   . Depressive disorder, not elsewhere classified   . Morbid obesity ([place])   . Need for prophylactic vaccination and inoculation against influenza   . Allergic rhinitis, cause unspecified   . Other malaise and fatigue   . Disorder of bone and cartilage, unspecified   . Diverticulosis of colon (without mention of hemorrhage)   . Benign neoplasm of colon   . Gout, unspecified   . Unspecified glaucoma   . Osteoarthrosis, unspecified whether generalized or localized, unspecified site   . Gout flare 02/06/2011  . Anemia, unspecified 04/05/2011  . Pneumonia   . Headache(784.0)   . Acute duodenal ulcer with hemorrhage 03/26/2013   Past Surgical History  Procedure Laterality Date  . Coronary angioplasty with stent placement      RCA stent  . Vesicovaginal fistula closure w/ tah    . Left hip replacement      s/p 2002. Secondary to DJD  . Ectopic pregnancy surgery    . Esophagogastroduodenoscopy N/A  03/26/2013    Procedure: ESOPHAGOGASTRODUODENOSCOPY (EGD);  Surgeon: Gatha Mayer, MD;  Location: [facility] ENDOSCOPY;  Service: Endoscopy;  Laterality: N/A;  . Left heart catheterization with coronary angiogram N/A 09/11/2012    Procedure: LEFT HEART CATHETERIZATION WITH CORONARY ANGIOGRAM;  Surgeon: Larey Dresser, MD;  Location: [facility] CATH LAB;  Service: Cardiovascular;  Laterality: N/A;   Family History  Problem Relation Age of Onset  . Breast cancer Sister   . Hypertension Father   . Stroke Father   . Colon cancer Neg Hx    Social History  Substance Use Topics  . Smoking status: Former Smoker    Quit date: 05/30/1990  . Smokeless tobacco: Never Used  . Alcohol Use: No   OB History    No data available     Review of Systems  Unable to perform ROS: Dementia      Allergies  Sulfonamide derivatives and Tramadol  Home Medications   Prior to Admission medications   Medication Sig Start Date End Date Taking? Authorizing Provider  acetaminophen (TYLENOL) 325 MG tablet Take 650 mg by mouth 3 (three) times daily.    Historical Provider, MD  allopurinol (ZYLOPRIM) 100 MG tablet Take 1 tablet (100 mg total) by mouth daily. 06/03/12   Biagio Borg, MD  aspirin 81 MG tablet Take 81 mg by mouth daily.    Historical Provider, MD  atorvastatin (LIPITOR) 80 MG tablet Take 1 tablet (80 mg total) by mouth daily at 6 PM. 09/16/12   Thurnell Lose, MD  azithromycin (ZITHROMAX Z-PAK) 250 MG  tablet Use as directed Patient not taking: Reported on 01/24/2014 07/31/13   Biagio Borg, MD  cholecalciferol (VITAMIN D) 1000 UNITS tablet Take 1,000 Units by mouth daily.    Historical Provider, MD  citalopram (CELEXA) 10 MG tablet Take 1 tablet (10 mg total) by mouth daily. 03/20/13   Biagio Borg, MD  diphenoxylate-atropine (LOMOTIL) 2.5-0.025 MG per tablet Take 1 tablet by mouth 2 (two) times daily as needed for diarrhea or loose stools. 04/29/13   Biagio Borg, MD  Elastic Bandages & Supports (JOBST RELIEF  20-30MMHG LG) MISC by Does not apply route.    Historical Provider, MD  ferrous sulfate 325 (65 FE) MG tablet Take 325 mg by mouth 2 (two) times daily.     Historical Provider, MD  furosemide (LASIX) 20 MG tablet Take 20 mg by mouth daily.    Historical Provider, MD  Glucosamine 500 MG CAPS Take by mouth daily.    Historical Provider, MD  Loperamide HCl (IMODIUM PO) Take 1 tablet by mouth as needed.    Historical Provider, MD  methocarbamol (ROBAXIN) 500 MG tablet Take 500 mg by mouth 3 (three) times daily as needed for muscle spasms.     Historical Provider, MD  metoprolol tartrate (LOPRESSOR) 25 MG tablet Take 12.5 mg by mouth 2 (two) times daily.    Historical Provider, MD  nystatin (MYCOSTATIN) powder Use as directed twice per day as needed 10/10/13   Biagio Borg, MD  ondansetron (ZOFRAN) 4 MG tablet Take 1 tablet (4 mg total) by mouth every 8 (eight) hours as needed for nausea. 08/23/12   Biagio Borg, MD  pantoprazole (PROTONIX) 40 MG tablet Take 1 tablet (40 mg total) by mouth daily before breakfast. 03/27/13   Jonetta Osgood, MD  traMADol (ULTRAM) 50 MG tablet Take 1 tablet (50 mg total) by mouth every 8 (eight) hours as needed (pain). 03/27/13   Shanker Kristeen Mans, MD  Turmeric 450 MG CAPS Take 450 mg by mouth daily.     Historical Provider, MD   BP 153/65 mmHg  Pulse 91  Temp(Src) 98.3 F (36.8 C) (Oral)  SpO2 97% Physical Exam  Constitutional: She is oriented to person, place, and time. She appears well-developed and well-nourished. No distress.  HENT:  Head: Normocephalic and atraumatic.  Right Ear: Hearing normal.  Left Ear: Hearing normal.  Nose: Nose normal.  Mouth/Throat: Oropharynx is clear and moist and mucous membranes are normal.  Eyes: Conjunctivae and EOM are normal. Pupils are equal, round, and reactive to light.  Neck: Normal range of motion. Neck supple.  Cardiovascular: Regular rhythm, S1 normal and S2 normal.  Exam reveals no gallop and no friction rub.   No  murmur heard. Pulmonary/Chest: Effort normal and breath sounds normal. No respiratory distress. She exhibits no tenderness.  Abdominal: Soft. Normal appearance and bowel sounds are normal. There is no hepatosplenomegaly. There is no tenderness. There is no rebound, no guarding, no tenderness at McBurney's point and negative Murphy's sign. No hernia.  Musculoskeletal: Normal range of motion.  Neurological: She is alert and oriented to person, place, and time. She has normal strength. No cranial nerve deficit or sensory deficit. Coordination normal. GCS eye subscore is 2. GCS verbal subscore is 4. GCS motor subscore is 5.  Skin: Skin is warm, dry and intact. No rash noted. No cyanosis.  Psychiatric: She has a normal mood and affect. Her speech is normal and behavior is normal. Thought content normal.  Nursing note  and vitals reviewed.   ED Course  Procedures (including critical care time) Labs Review Labs Reviewed  CBC WITH DIFFERENTIAL/PLATELET  COMPREHENSIVE METABOLIC PANEL  URINALYSIS, ROUTINE W REFLEX MICROSCOPIC (NOT AT [facility])  TROPONIN I  I-STAT CG4 LACTIC ACID, ED    Imaging Review No results found. I have personally reviewed and evaluated these images and lab results as part of my medical decision-making.   EKG Interpretation None      MDM   Final diagnoses:  None   mental status changes, unclear etiology  Patient was sent to the ER for evaluation of decreased mental status. Patient has been previously diagnosed with dementia. She reportedly has times when she is extremely disorganized and incoherent and other times when she is much more alert and oriented. This morning staff checked on her and she was difficult to awaken, was sent to the ER. At arrival to the ER she was exhibiting extreme somnolence. She required shaking to awaken and then was confused with her speech pattern. This did improve over time here in the ER. She has normal blood work. Urinalysis did not suggest  infection. Chest x-ray did not show any evidence of pneumonia. CT raise suspicion for possible stroke. She was sent for MRI. Unfortunately she did not tolerate MRI because of agitation. This required Ativan to complete the study. MRI ultimately showed that there was no acute stroke, but patient is now very sedated. Patient's daughter is present in the ER. She did not think that the patient's behavior this morning is very unusual for her. She does not want to pursue placement in skilled nursing facility or moving her mother at this time. She is comfortable with her returning to her current home environment. Patient will therefore be monitored here in the ER until Ativan wears off and then return to home.    Orpah Greek, MD 11/27/14 1327

## 2014-11-27 NOTE — Discharge Instructions (Signed)
Confusion Confusion is the inability to think with your usual speed or clarity. Confusion may come on quickly or slowly over time. How quickly the confusion comes on depends on the cause. Confusion can be due to any number of causes. CAUSES   Concussion, head injury, or head trauma.  Seizures.  Stroke.  Fever.  Brain tumor.  Age related decreased brain function (dementia).  Heightened emotional states like rage or terror.  Mental illness in which the person loses the ability to determine what is real and what is not (hallucinations).  Infections such as a urinary tract infection (UTI).  Toxic effects from alcohol, drugs, or prescription medicines.  Dehydration and an imbalance of salts in the body (electrolytes).  Lack of sleep.  Low blood sugar (diabetes).  Low levels of oxygen from conditions such as chronic lung disorders.  Drug interactions or other medicine side effects.  Nutritional deficiencies, especially niacin, thiamine, vitamin C, or vitamin B.  Sudden drop in body temperature (hypothermia).  Change in routine, such as when traveling or hospitalized. SIGNS AND SYMPTOMS  People often describe their thinking as cloudy or unclear when they are confused. Confusion can also include feeling disoriented. That means you are unaware of where or who you are. You may also not know what the date or time is. If confused, you may also have difficulty paying attention, remembering, and making decisions. Some people also act aggressively when they are confused.  DIAGNOSIS  The medical evaluation of confusion may include:  Blood and urine tests.  X-rays.  Brain and nervous system tests.  Analyzing your brain waves (electroencephalogram or EEG).  Magnetic resonance imaging (MRI) of your head.  Computed tomography (CT) scan of your head.  Mental status tests in which your health care provider may ask many questions. Some of these questions may seem silly or strange,  but they are a very important test to help diagnose and treat confusion. TREATMENT  An admission to the hospital may not be needed, but a person with confusion should not be left alone. Stay with a family member or friend until the confusion clears. Avoid alcohol, pain relievers, or sedative drugs until you have fully recovered. Do not drive until directed by your health care provider. HOME CARE INSTRUCTIONS  What family and friends can do:  To find out if someone is confused, ask the person to state his or her name, age, and the date. If the person is unsure or answers incorrectly, he or she is confused.  Always introduce yourself, no matter how well the person knows you.  Often remind the person of his or her location.  Place a calendar and clock near the confused person.  Help the person with his or her medicines. You may want to use a pill box, an alarm as a reminder, or give the person each dose as prescribed.  Talk about current events and plans for the day.  Try to keep the environment calm, quiet, and peaceful.  Make sure the person keeps follow-up visits with his or her health care provider. PREVENTION  Ways to prevent confusion:  Avoid alcohol.  Eat a balanced diet.  Get enough sleep.  Take medicine only as directed by your health care provider.  Do not become isolated. Spend time with other people and make plans for your days.  Keep careful watch on your blood sugar levels if you are diabetic. SEEK IMMEDIATE MEDICAL CARE IF:   You develop severe headaches, repeated vomiting, seizures, blackouts, or   slurred speech.  There is increasing confusion, weakness, numbness, restlessness, or personality changes.  You develop a loss of balance, have marked dizziness, feel uncoordinated, or fall.  You have delusions, hallucinations, or develop severe anxiety.  Your family members think you need to be rechecked.   This information is not intended to replace advice given  to you by your health care provider. Make sure you discuss any questions you have with your health care provider.   Document Released: 03/09/2004 Document Revised: 02/20/2014 Document Reviewed: 03/07/2013 Elsevier Interactive Patient Education 2016 [employer]  

## 2015-11-14 ENCOUNTER — Other Ambulatory Visit: Payer: Self-pay | Admitting: Internal Medicine

## 2016-02-04 ENCOUNTER — Non-Acute Institutional Stay (SKILLED_NURSING_FACILITY): Payer: Medicare Other | Admitting: Internal Medicine

## 2016-02-04 ENCOUNTER — Encounter: Payer: Self-pay | Admitting: Internal Medicine

## 2016-02-04 DIAGNOSIS — I5022 Chronic systolic (congestive) heart failure: Secondary | ICD-10-CM

## 2016-02-04 DIAGNOSIS — I2510 Atherosclerotic heart disease of native coronary artery without angina pectoris: Secondary | ICD-10-CM

## 2016-02-04 DIAGNOSIS — K5900 Constipation, unspecified: Secondary | ICD-10-CM | POA: Diagnosis not present

## 2016-02-04 DIAGNOSIS — H04123 Dry eye syndrome of bilateral lacrimal glands: Secondary | ICD-10-CM

## 2016-02-04 DIAGNOSIS — E559 Vitamin D deficiency, unspecified: Secondary | ICD-10-CM | POA: Diagnosis not present

## 2016-02-04 DIAGNOSIS — F419 Anxiety disorder, unspecified: Secondary | ICD-10-CM | POA: Diagnosis not present

## 2016-02-04 DIAGNOSIS — N182 Chronic kidney disease, stage 2 (mild): Secondary | ICD-10-CM | POA: Diagnosis not present

## 2016-02-04 DIAGNOSIS — F0150 Vascular dementia without behavioral disturbance: Secondary | ICD-10-CM

## 2016-02-04 DIAGNOSIS — K219 Gastro-esophageal reflux disease without esophagitis: Secondary | ICD-10-CM | POA: Diagnosis not present

## 2016-02-04 DIAGNOSIS — M19012 Primary osteoarthritis, left shoulder: Secondary | ICD-10-CM

## 2016-02-04 DIAGNOSIS — M19011 Primary osteoarthritis, right shoulder: Secondary | ICD-10-CM

## 2016-02-04 DIAGNOSIS — I10 Essential (primary) hypertension: Secondary | ICD-10-CM

## 2016-02-04 NOTE — Progress Notes (Signed)
LOCATION: [place]  PCP: No primary care provider on file.   Code Status: DNR  Goals of care: Advanced Directive information Advanced Directives 02/04/2016  Does Patient Have a Medical Advance Directive? Yes  Type of Advance Directive Out of facility DNR (pink MOST or yellow form)  Does patient want to make changes to medical advance directive? No - Patient declined  Copy of [place] in Chart? -  Would patient like information on creating a medical advance directive? -  Pre-existing out of facility DNR order (yellow form or pink MOST form) -       Extended Emergency Contact Information Primary Emergency Contact: Selena Batten, [place] [address] [place] Phone: [phone] Relation: Relative Secondary Emergency Contact: Robin Searing, [place] [address] [place] Phone: [phone] Relation: Friend   Allergies  Allergen Reactions  . Sulfonamide Derivatives Other (See Comments)    unknown  . Tramadol     Chief Complaint  Patient presents with  . New Admit To SNF    New Admission Visit      HPI:  Patient is a 82 y.o. female seen today for Long-term care from another facility. She has medical history of congestive heart failure, hyperlipidemia, hypertension, coronary artery disease, gout, constipation among others. On review of her FL 2 form she is constantly disoriented. She is not ambulated 3 and needs assistance with her ADLs. She is incontinent with her bowel and bladder. She is seen in her room today. She participates some in conversation and is pleasantly confused. She denies any concerning this visit.[facility]. Now she requires a higher level of care. She needs [employer]. She needs assistance with feeding.  Review of Systems:  Constitutional: Negative for fever, chills, diaphoresis.  HENT:  Negative for headache, congestion, nasal discharge, sore throat, difficulty swallowing.   Eyes: Negative for blurred vision, double vision and discharge. Wears glasses.  Respiratory: Negative for cough, shortness of breath and wheezing.   Cardiovascular: Negative for chest pain, palpitations, leg swelling.  Gastrointestinal: Negative for heartburn, nausea, vomiting, abdominal pain. Last bowel movement was yesterday Genitourinary: Negative for dysuria Musculoskeletal: Negative for back pain, fall in the facility.  Skin: Negative for itching, rash.  Neurological: Negative for dizziness. Psychiatric/Behavioral: Negative for depression   Past Medical History:  Diagnosis Date  . Acute duodenal ulcer with hemorrhage 03/26/2013  . Acute myocardial infarction, unspecified site, episode of care unspecified   . Allergic rhinitis, cause unspecified   . Anemia, unspecified 04/05/2011  . Benign neoplasm of colon   . Congestive heart failure, unspecified   . Coronary atherosclerosis of unspecified type of vessel, native or graft   . Depressive disorder, not elsewhere classified   . Disorder of bone and cartilage, unspecified   . Diverticulosis of colon (without mention of hemorrhage)   . Esophageal reflux   . Gout flare 02/06/2011  . Gout, unspecified   . Headache(784.0)   . Morbid obesity ([place])   . Need for prophylactic vaccination and inoculation against influenza   . Osteoarthrosis, unspecified whether generalized or localized, unspecified site   . Other and unspecified hyperlipidemia   . Other malaise and fatigue   . Pneumonia   . Unspecified essential hypertension   . Unspecified glaucoma(365.9)    Past Surgical History:  Procedure Laterality  Date  . CORONARY ANGIOPLASTY WITH STENT PLACEMENT     RCA stent  . ECTOPIC PREGNANCY SURGERY    . ESOPHAGOGASTRODUODENOSCOPY N/A 03/26/2013   Procedure: ESOPHAGOGASTRODUODENOSCOPY (EGD);  Surgeon: Gatha Mayer, MD;  Location[facility] ENDOSCOPY;   Service: Endoscopy;  Laterality: N/A;  . LEFT HEART CATHETERIZATION WITH CORONARY ANGIOGRAM N/A 09/11/2012   Procedure: LEFT HEART CATHETERIZATION WITH CORONARY ANGIOGRAM;  Surgeon: Larey Dresser, MD;  Location: [facility] CATH LAB;  Service: Cardiovascular;  Laterality: N/A;  . Left hip replacement     s/p 2002. Secondary to DJD  . VESICOVAGINAL FISTULA CLOSURE W/ TAH     Social History:   reports that she quit smoking about 25 years ago. She has never used smokeless tobacco. She reports that she does not drink alcohol or use drugs.  Family History  Problem Relation Age of Onset  . Breast cancer Sister   . Hypertension Father   . Stroke Father   . Colon cancer Neg Hx     Medications: Allergies as of 02/04/2016      Reactions   Sulfonamide Derivatives Other (See Comments)   unknown   Tramadol       Medication List       Accurate as of 02/04/16  2:47 PM. Always use your most recent med list.          acetaminophen 325 MG tablet Commonly known as:  TYLENOL Take 650 mg by mouth every 8 (eight) hours as needed for mild pain.   cholecalciferol 1000 units tablet Commonly known as:  VITAMIN D Take 2,000 Units by mouth daily.   famotidine 20 MG tablet Commonly known as:  PEPCID Take 20 mg by mouth daily.   furosemide 20 MG tablet Commonly known as:  LASIX Take 20 mg by mouth daily.   IMODIUM PO Take 1 tablet by mouth 2 (two) times daily as needed (diarrhea).   LORazepam 0.5 MG tablet Commonly known as:  ATIVAN Take 0.5 mg by mouth every 6 (six) hours as needed for anxiety.   metoprolol tartrate 25 MG tablet Commonly known as:  LOPRESSOR Take 12.5 mg by mouth 2 (two) times daily.   OXYGEN Inhale 2 L into the lungs as needed.   polyethylene glycol packet Commonly known as:  MIRALAX / GLYCOLAX Take 17 g by mouth daily as needed.   polyvinyl alcohol 1.4 % ophthalmic solution Commonly known as:  LIQUIFILM TEARS Place 1 drop into both eyes at bedtime.   potassium  chloride SA 20 MEQ tablet Commonly known as:  K-DUR,KLOR-CON Take 20 mEq by mouth daily. With lasix.   pseudoephedrine-guaifenesin 60-600 MG 12 hr tablet Commonly known as:  MUCINEX D Take 2 tablets by mouth 2 (two) times daily as needed for congestion.       Immunizations: Immunization History  Administered Date(s) Administered  . H1N1 01/23/2008  . Influenza Whole 12/16/2007, 12/14/2009     Physical Exam:  Vitals:   02/04/16 1435  BP: 123/69  Pulse: 65  Resp: 20  Temp: 97.2 F (36.2 C)  TempSrc: Oral  SpO2: 96%  Weight: 200 lb (90.7 kg)  Height: 5\' 1"  (1.549 m)   Body mass index is 37.79 kg/m.  General- elderly female, Obese, in no acute distress Head- normocephalic, atraumatic Nose- no maxillary or frontal sinus tenderness, no nasal discharge Throat- moist mucus membrane Eyes- PERRLA, EOMI, no pallor, no icterus, no discharge Neck- no cervical lymphadenopathy Cardiovascular- normal s1,s2, no murmur, no leg edema Respiratory- bilateral clear to  auscultation, no wheeze, no rhonchi, no crackles, no use of accessory muscles Abdomen- bowel sounds present, soft, non tender Musculoskeletal- able to move all 4 extremities, generalized weakness, on wheelchair, limited range of motion to both her shoulders, weakness more prominent on the right side. Neurological- alert and oriented to person only Skin- warm and dry Psychiatry- normal mood and affect    Labs reviewed: none available   Assessment/Plan  Chronic systolic Congestive heart failure Appears euvolemic. Continue Lasix 20 mg daily and metoprolol tartrate 12.5 mg twice a day. Continue potassium chloride supplement. Monitor BMP. Continue oxygen 2 L via nasal cannula as needed for dyspnea. Will have her weight checked 3 times a week for one month and then on a weekly basis.  Vascular dementia without behavioral disturbance Provide supportive care. Continue blood pressure medicine. Fall precautions and pressure  ulcer prophylaxis to be taken.  Constipation Continue MiraLAX daily as needed for constipation. Monitor her bowel movement.  ckd stage 2 Check bmp  Hypertension Monitor blood pressure reading. Continue metoprolol tartrate 12.5 mg twice a day.  Coronary artery disease Remains chest pain-free. Continue beta blocker. Monitor  Vitamin D deficiency Continue vitamin D3 2000 units daily.  Dry eyes Continue artificial tear drops every day.  Gastroesophageal reflux disease Stable. Continue Pepcid 20 mg daily.  Anxiety Currently stable. Continue Ativan 0.5 mg every 6 hours as needed for anxiety. Get psychiatric consult.  Osteoarthritis Continue Tylenol 650 mg every 8 hours as needed for pain.    Goals of care: short term rehabilitation   Labs/tests ordered: cbc, cmp 02/10/16  Family/ staff Communication: reviewed care plan with patient and nursing supervisor    Blanchie Serve, MD Internal Medicine [place], [place] [address] Cell Phone (Monday-Friday 8 am - 5 pm): [phone] On Call: [phone] and follow prompts after 5 pm and on weekends Office Phone: [phone] Office Fax: [phone]

## 2016-02-10 LAB — BASIC METABOLIC PANEL
BUN: 30 mg/dL — AB (ref 4–21)
Creatinine: 1.1 mg/dL (ref 0.5–1.1)
Glucose: 89 mg/dL
POTASSIUM: 4.5 mmol/L (ref 3.4–5.3)
SODIUM: 144 mmol/L (ref 137–147)

## 2016-02-10 LAB — CBC AND DIFFERENTIAL
HEMATOCRIT: 29 % — AB (ref 36–46)
HEMOGLOBIN: 9.1 g/dL — AB (ref 12.0–16.0)
Platelets: 241 10*3/uL (ref 150–399)
WBC: 8.7 10^3/mL

## 2016-02-10 LAB — HEPATIC FUNCTION PANEL
ALT: 8 U/L (ref 7–35)
AST: 9 U/L — AB (ref 13–35)
Alkaline Phosphatase: 81 U/L (ref 25–125)
BILIRUBIN, TOTAL: 0.4 mg/dL

## 2016-02-15 ENCOUNTER — Other Ambulatory Visit: Payer: Self-pay | Admitting: *Deleted

## 2016-02-15 MED ORDER — LORAZEPAM 0.5 MG PO TABS: ORAL_TABLET | ORAL | 0 refills | Status: DC

## 2016-02-15 NOTE — Telephone Encounter (Signed)
[facility] #[phone] Fax: [phone] 

## 2016-02-23 ENCOUNTER — Encounter: Payer: Self-pay | Admitting: Adult Health

## 2016-02-23 ENCOUNTER — Non-Acute Institutional Stay (SKILLED_NURSING_FACILITY): Payer: Medicare Other | Admitting: Adult Health

## 2016-02-23 DIAGNOSIS — R059 Cough, unspecified: Secondary | ICD-10-CM

## 2016-02-23 DIAGNOSIS — F0150 Vascular dementia without behavioral disturbance: Secondary | ICD-10-CM | POA: Diagnosis not present

## 2016-02-23 DIAGNOSIS — R05 Cough: Secondary | ICD-10-CM

## 2016-02-23 DIAGNOSIS — J189 Pneumonia, unspecified organism: Secondary | ICD-10-CM

## 2016-02-23 DIAGNOSIS — I5022 Chronic systolic (congestive) heart failure: Secondary | ICD-10-CM | POA: Diagnosis not present

## 2016-02-23 NOTE — Progress Notes (Signed)
DATE:  02/23/2016   MRN:  [id number]  BIRTHDAY: Aug 23, 1921  Facility:  Nursing Home Location:  [place] Room Number: 1105-A  LEVEL OF CARE:  SNF (31)  Contact Information    Name Relation Home Work [name] Relative [place] Son [phone]     [name], [name] [phone]     [name], [name] [phone]         Code Status History    Date Active Date Inactive Code Status Order ID Comments User Context   03/24/2013  8:34 PM 03/27/2013  8:38 PM Full Code [id number]  Hosie Poisson, MD Inpatient   09/10/2012  7:57 PM 09/16/2012  7:24 PM Full Code [id number]  Berle Mull, MD Inpatient   01/29/2011 10:06 PM 02/02/2011  2:32 PM DNR [id number]  Ernst Spell, RN Inpatient    Advance Directive Documentation   Flowsheet Row Most Recent Value  Type of Advance Directive  Healthcare Power of [place], Out of facility DNR (pink MOST or yellow form)  Pre-existing out of facility DNR order (yellow form or pink MOST form)  No data  "MOST" Form in Place?  No data       Chief Complaint  Patient presents with  . Acute Visit    Pneumonia    HISTORY OF PRESENT ILLNESS:  This is a 22-YO female who had fever of 102.0 and productive cough. Chest x-ray showed bilateral basilar changes of atelectasis or pneumonia. She was noted to have wheezing on bilateral lung fields.  She has been admitted to [facility] on 02/03/16 from [place]. She now requires a higher level of care and has been admitted for a long-term care.    PAST MEDICAL HISTORY:  Past Medical History:  Diagnosis Date  . Acute duodenal ulcer with hemorrhage 03/26/2013  . Acute myocardial infarction, unspecified site, episode of care unspecified   . Allergic rhinitis, cause unspecified   . Anemia, unspecified 04/05/2011  . Benign neoplasm of colon   . Congestive heart  failure, unspecified   . Coronary atherosclerosis of unspecified type of vessel, native or graft   . Depressive disorder, not elsewhere classified   . Disorder of bone and cartilage, unspecified   . Diverticulosis of colon (without mention of hemorrhage)   . Esophageal reflux   . Gout flare 02/06/2011  . Gout, unspecified   . Headache(784.0)   . Morbid obesity ([place])   . Need for prophylactic vaccination and inoculation against influenza   . Osteoarthrosis, unspecified whether generalized or localized, unspecified site   . Other and unspecified hyperlipidemia   . Other malaise and fatigue   . Pneumonia   . Unspecified essential hypertension   . Unspecified glaucoma(365.9)      CURRENT MEDICATIONS: Reviewed  Patient's Medications  New Prescriptions   No medications on file  Previous Medications   ACETAMINOPHEN (TYLENOL) 325 MG TABLET    Take 650 mg by mouth every 4 (four) hours as needed for mild pain or fever.    ALBUTEROL (PROVENTIL) (2.5 MG/3ML) 0.083% NEBULIZER SOLUTION    Take 2.5 mg by nebulization every 4 (four) hours as needed for wheezing or shortness of breath. x2 weeks   CHOLECALCIFEROL (VITAMIN D3) 2000 UNITS TABS    Take 1 tablet by mouth daily.   FAMOTIDINE (PEPCID) 20 MG TABLET    Take 20 mg by mouth at bedtime.  FUROSEMIDE (LASIX) 20 MG TABLET    Take 20 mg by mouth daily.    GUAIFENESIN-DEXTROMETHORPHAN (ROBITUSSIN DM) 100-10 MG/5ML SYRUP    Take 10 mLs by mouth every 6 (six) hours as needed for cough.   LOPERAMIDE HCL (IMODIUM PO)    Take 1 tablet by mouth 2 (two) times daily as needed (diarrhea). 2 mg   METOPROLOL TARTRATE (LOPRESSOR) 25 MG TABLET    Take 12.5 mg by mouth 2 (two) times daily.    MOXIFLOXACIN (AVELOX) 400 MG TABLET    Take 400 mg by mouth daily at 8 pm. x7 days   OXYGEN    Inhale 2 L into the lungs as needed.   POLYETHYLENE GLYCOL (MIRALAX / GLYCOLAX) PACKET    Take 17 g by mouth daily as needed.   POLYVINYL ALCOHOL (LIQUIFILM TEARS) 1.4 %  OPHTHALMIC SOLUTION    Place 1 drop into both eyes at bedtime.   POTASSIUM CHLORIDE SA (K-DUR,KLOR-CON) 20 MEQ TABLET    Take 20 mEq by mouth daily. With lasix.   PSEUDOEPHEDRINE-GUAIFENESIN (MUCINEX D) 60-600 MG 12 HR TABLET    Take 2 tablets by mouth every 12 (twelve) hours. x1 week   SACCHAROMYCES BOULARDII (FLORASTOR) 250 MG CAPSULE    Take 250 mg by mouth 2 (two) times daily. x10 days   TRAZODONE (DESYREL) 50 MG TABLET    Take 25 mg by mouth. Take 1/2 tablet to = 25 mg qd prn and qhs scheduled.  Modified Medications   No medications on file  Discontinued Medications   CHOLECALCIFEROL (VITAMIN D) 1000 UNITS TABLET    Take 2,000 Units by mouth daily.    LORAZEPAM (ATIVAN) 0.5 MG TABLET    Take one tablet by mouth every 6 hours as needed for agitation     Allergies  Allergen Reactions  . Sulfonamide Derivatives Other (See Comments)    unknown  . Tramadol      REVIEW OF SYSTEMS:  GENERAL: no change in appetite, no fatigue, no weight changes, no fever, chills or weakness EYES: Denies change in vision, dry eyes, eye pain, itching or discharge EARS: Denies change in hearing, ringing in ears, or earache NOSE: Denies nasal congestion or epistaxis MOUTH and THROAT: Denies oral discomfort, gingival pain or bleeding, pain from teeth or hoarseness   RESPIRATORY: no cough, SOB, DOE, wheezing, hemoptysis CARDIAC: no chest pain, edema or palpitations GI: no abdominal pain, diarrhea, constipation, heart burn, nausea or vomiting GU: Denies dysuria, frequency, hematuria, incontinence, or discharge PSYCHIATRIC: Denies feeling of depression or anxiety. No report of hallucinations, insomnia, paranoia, or agitation   PHYSICAL EXAMINATION  GENERAL APPEARANCE: Well nourished. In no acute distress. Obese SKIN:  Skin is warm and dry.  HEAD: Normal in size and contour. No evidence of trauma EYES: Lids open and close normally. No blepharitis, entropion or ectropion. PERRL. Conjunctivae are clear and  sclerae are white. Lenses are without opacity EARS: Pinnae are normal. Hard of hearing. MOUTH and THROAT: Lips are without lesions. Oral mucosa is moist and without lesions. Tongue is normal in shape, size, and color and without lesions NECK: supple, trachea midline, no neck masses, no thyroid tenderness, no thyromegaly LYMPHATICS: no LAN in the neck, no supraclavicular LAN RESPIRATORY: breathing is even & unlabored, BS CTAB CARDIAC: RRR, no murmur,no extra heart sounds, no edema GI: abdomen soft, normal BS, no masses, no tenderness, no hepatomegaly, no splenomegaly EXTREMITIES:  Able to move 4 extremities; BLE generalized weakness PSYCHIATRIC: Alert to self, disoriented to time and  place. Affect and behavior are appropriate  LABS/RADIOLOGY: Labs reviewed: Basic Metabolic Panel:  Recent Labs  02/10/16  NA 144  K 4.5  BUN 30*  CREATININE 1.1   Liver Function Tests:  Recent Labs  02/10/16  AST 9*  ALT 8  ALKPHOS 81    CBC:  Recent Labs  02/10/16  WBC 8.7  HGB 9.1*  HCT 29*  PLT 241    ASSESSMENT/PLAN:  HCAP - start Avelox 400 mg 1 tab by mouth daily 7 days and Florastor 250 mg 1 capsule by mouth twice a day 10 days and albuterol 2.5 mg/3 ML 1 Neb every 4 hours when necessary 2 weeks  Cough - start Mucinex DM 600-60 mg 1 tab by mouth every 12 hours 1 week  Chronic systolic CHF - continue Lasix 20 mg 1 tab by mouth daily, metoprolol 12.5 mg by mouth twice a day, KCl supplementation  Vascular dementia without behavioral disturbance - continue supportive care; fall precautions      Beth Gutierrez - NP [employer] [phone]

## 2016-02-29 ENCOUNTER — Encounter: Payer: Self-pay | Admitting: Adult Health

## 2016-02-29 ENCOUNTER — Non-Acute Institutional Stay (SKILLED_NURSING_FACILITY): Payer: Medicare Other | Admitting: Adult Health

## 2016-02-29 DIAGNOSIS — J189 Pneumonia, unspecified organism: Secondary | ICD-10-CM | POA: Diagnosis not present

## 2016-02-29 DIAGNOSIS — K219 Gastro-esophageal reflux disease without esophagitis: Secondary | ICD-10-CM

## 2016-02-29 DIAGNOSIS — G4700 Insomnia, unspecified: Secondary | ICD-10-CM

## 2016-02-29 DIAGNOSIS — F0150 Vascular dementia without behavioral disturbance: Secondary | ICD-10-CM | POA: Diagnosis not present

## 2016-02-29 DIAGNOSIS — R531 Weakness: Secondary | ICD-10-CM | POA: Diagnosis not present

## 2016-02-29 DIAGNOSIS — I10 Essential (primary) hypertension: Secondary | ICD-10-CM | POA: Diagnosis not present

## 2016-02-29 DIAGNOSIS — I5022 Chronic systolic (congestive) heart failure: Secondary | ICD-10-CM

## 2016-02-29 NOTE — Progress Notes (Signed)
DATE:  02/29/2016 MRN:  [id number]  BIRTHDAY: September 20, 1921  Facility:  Nursing Home Location:  [place] [facility] [place] Room Number: 1105-A  LEVEL OF CARE:  SNF (31)  Contact Information    Name Relation Home Work [place] Relative [place] Son [phone]     Courtany, [place] [phone]     Nikya, [place]za [phone]         Code Status History    Date Active Date Inactive Code Status Order ID Comments User Context   03/24/2013  8:34 PM 03/27/2013  8:38 PM Full Code [id number]  Hosie Poisson, MD Inpatient   09/10/2012  7:57 PM 09/16/2012  7:24 PM Full Code [id number]  Berle Mull, MD Inpatient   01/29/2011 10:06 PM 02/02/2011  2:32 PM DNR [id number]  Ernst Spell, RN Inpatient    Advance Directive Documentation   Flowsheet Row Most Recent Value  Type of Advance Directive  Out of facility DNR (pink MOST or yellow form), Healthcare Power of Attorney  Pre-existing out of facility DNR order (yellow form or pink MOST form)  No data  "MOST" Form in Place?  No data       Chief Complaint  Patient presents with  . Medical Management of Chronic Issues    HISTORY OF PRESENT ILLNESS:  This is a 52-YO female seen for a routine visit.  She is a long-term care resident of [facility].  She had been living in an ALF since 2014, but now requires a higher level of care. She was currently treated for pneumonia with Avelox. She is currently having PT for therapeutic exercises addressing weakness.      PAST MEDICAL HISTORY:  Past Medical History:  Diagnosis Date  . Acute duodenal ulcer with hemorrhage 03/26/2013  . Acute myocardial infarction, unspecified site, episode of care unspecified   . Allergic rhinitis, cause unspecified   . Anemia, unspecified 04/05/2011  . Benign neoplasm of colon   . Congestive heart failure, unspecified   . Coronary  atherosclerosis of unspecified type of vessel, native or graft   . Depressive disorder, not elsewhere classified   . Disorder of bone and cartilage, unspecified   . Diverticulosis of colon (without mention of hemorrhage)   . Esophageal reflux   . Gout flare 02/06/2011  . Gout, unspecified   . Headache(784.0)   . Morbid obesity ([place])   . Need for prophylactic vaccination and inoculation against influenza   . Osteoarthrosis, unspecified whether generalized or localized, unspecified site   . Other and unspecified hyperlipidemia   . Other malaise and fatigue   . Pneumonia   . Unspecified essential hypertension   . Unspecified glaucoma(365.9)      CURRENT MEDICATIONS: Reviewed  Patient's Medications  New Prescriptions   No medications on file  Previous Medications   ACETAMINOPHEN (TYLENOL) 325 MG TABLET    Take 650 mg by mouth every 4 (four) hours as needed for mild pain or fever.    ALBUTEROL (PROVENTIL) (2.5 MG/3ML) 0.083% NEBULIZER SOLUTION    Take 2.5 mg by nebulization every 4 (four) hours as needed for wheezing or shortness of breath. x2 weeks   CHOLECALCIFEROL (VITAMIN D3) 2000 UNITS TABS    Take 1 tablet by mouth daily.   FAMOTIDINE (PEPCID) 20 MG TABLET    Take 20 mg by mouth at bedtime.    FUROSEMIDE (LASIX) 20 MG TABLET  Take 20 mg by mouth daily.    LOPERAMIDE HCL (IMODIUM PO)    Take 1 tablet by mouth 2 (two) times daily as needed (diarrhea). 2 mg   METOPROLOL TARTRATE (LOPRESSOR) 25 MG TABLET    Take 12.5 mg by mouth 2 (two) times daily.    MOXIFLOXACIN (AVELOX) 400 MG TABLET    Take 400 mg by mouth daily at 8 pm. x7 days   OXYGEN    Inhale 2 L into the lungs as needed.   POLYETHYLENE GLYCOL (MIRALAX / GLYCOLAX) PACKET    Take 17 g by mouth daily as needed.   POLYVINYL ALCOHOL (LIQUIFILM TEARS) 1.4 % OPHTHALMIC SOLUTION    Place 1 drop into both eyes at bedtime.   POTASSIUM CHLORIDE SA (K-DUR,KLOR-CON) 20 MEQ TABLET    Take 20 mEq by mouth daily. With lasix.    PSEUDOEPHEDRINE-GUAIFENESIN (MUCINEX D) 60-600 MG 12 HR TABLET    Take 2 tablets by mouth every 12 (twelve) hours. x1 week   SACCHAROMYCES BOULARDII (FLORASTOR) 250 MG CAPSULE    Take 250 mg by mouth 2 (two) times daily. x10 days   TRAZODONE (DESYREL) 50 MG TABLET    Take 25 mg by mouth. Take 1/2 tablet to = 25 mg qd prn and qhs scheduled.  Modified Medications   No medications on file  Discontinued Medications   No medications on file     Allergies  Allergen Reactions  . Sulfonamide Derivatives Other (See Comments)    unknown  . Tramadol      REVIEW OF SYSTEMS:  Unable to obtain due to advanced dementia   PHYSICAL EXAMINATION  GENERAL APPEARANCE: Well nourished. In no acute distress. Obese SKIN:  Skin is warm and dry. There are no suspicious lesions or rash HEAD: Normal in size and contour. No evidence of trauma EYES: Lids open and close normally. No blepharitis, entropion or ectropion. PERRL. Conjunctivae are clear and sclerae are white. Lenses are without opacity EARS: Pinnae are normal. Patient hears normal voice tunes of the examiner MOUTH and THROAT: Lips are without lesions. Oral mucosa is moist and without lesions. Tongue is normal in shape, size, and color and without lesions NECK: supple, trachea midline, no neck masses, no thyroid tenderness, no thyromegaly LYMPHATICS: no LAN in the neck, no supraclavicular LAN RESPIRATORY: breathing is even & unlabored, BS CTAB CARDIAC: RRR, no murmur,no extra heart sounds, no edema GI: abdomen soft, normal BS, no masses, no tenderness, no hepatomegaly, no splenomegaly EXTREMITIES:  To move 4 extremities, BLE generalized weakness, limited ROM on bilateral shoulders PSYCHIATRIC:  Disoriented X 3. Affect and behavior are appropriate    LABS/RADIOLOGY: Labs reviewed: Basic Metabolic Panel:  Recent Labs  02/10/16  NA 144  K 4.5  BUN 30*  CREATININE 1.1   Liver Function Tests:  Recent Labs  02/10/16  AST 9*  ALT 8    ALKPHOS 81    CBC:  Recent Labs  02/10/16  WBC 8.7  HGB 9.1*  HCT 29*  PLT 241    ASSESSMENT/PLAN:  Generalized weakness - continue rehabilitation, PT, for therapeutic strengthening exercises; fall precautions  HCAP - continue Avelox for a total of 7 days and Florastor store for a total of 10 days  Insomnia - recently started on trazodone at bedtime   Vascular dementia without behavioral disturbance - continue supportive care; fall precautions  Hypertension - well controlled; continue metoprolol tartrate  Chronic systolic congestive heart failure - continue O2 at 2 L/minute via [place] continuously, continue  Lasix 20 mg 1 tab by mouth daily and KCl ER 20 meq PO Q D; check weight weekly  GERD - continue Pepcid 20 mg 1 tab by mouth daily at bedtime  Constipation - continue MiraLAX 17 g by mouth daily when necessary    Goals of care:  Long-term care   Monina C. [place]  -  NP [employer] [phone]

## 2016-04-14 ENCOUNTER — Encounter: Payer: Self-pay | Admitting: Adult Health

## 2016-04-14 ENCOUNTER — Non-Acute Institutional Stay (SKILLED_NURSING_FACILITY): Payer: Medicare Other | Admitting: Adult Health

## 2016-04-14 DIAGNOSIS — I5022 Chronic systolic (congestive) heart failure: Secondary | ICD-10-CM

## 2016-04-14 DIAGNOSIS — H04123 Dry eye syndrome of bilateral lacrimal glands: Secondary | ICD-10-CM

## 2016-04-14 DIAGNOSIS — E876 Hypokalemia: Secondary | ICD-10-CM | POA: Diagnosis not present

## 2016-04-14 DIAGNOSIS — D638 Anemia in other chronic diseases classified elsewhere: Secondary | ICD-10-CM

## 2016-04-14 DIAGNOSIS — N183 Chronic kidney disease, stage 3 unspecified: Secondary | ICD-10-CM

## 2016-04-14 NOTE — Progress Notes (Signed)
DATE:  04/14/2016   MRN:  [id number]  BIRTHDAY: 1921/10/16  Facility:  Nursing Home Location:  [place] [facility] [place] Room Number: 1105-A  LEVEL OF CARE:  SNF (31)  Contact Information    Name Relation Home Work Brooke Relative [place] [facility] Son [phone]     Cheyanna, Silvestro [phone]     Nattalie, [place] [phone]         Code Status History    Date Active Date Inactive Code Status Order ID Comments User Context   03/24/2013  8:34 PM 03/27/2013  8:38 PM Full Code [id number]  Hosie Poisson, MD Inpatient   09/10/2012  7:57 PM 09/16/2012  7:24 PM Full Code [id number]  Berle Mull, MD Inpatient   01/29/2011 10:06 PM 02/02/2011  2:32 PM DNR [id number]  Ernst Spell, RN Inpatient    Advance Directive Documentation   Flowsheet Row Most Recent Value  Type of Advance Directive  Out of facility DNR (pink MOST or yellow form), Healthcare Power of Attorney  Pre-existing out of facility DNR order (yellow form or pink MOST form)  No data  "MOST" Form in Place?  No data       Chief Complaint  Patient presents with  . Medical Management of Chronic Issues    HISTORY OF PRESENT ILLNESS:  This is a 83-YO female seen for a routine visit.  She is a long-term care resident of [facility]. She was recently discharged from PT services.   PAST MEDICAL HISTORY:  Past Medical History:  Diagnosis Date  . Acute duodenal ulcer with hemorrhage 03/26/2013  . Acute myocardial infarction, unspecified site, episode of care unspecified   . Allergic rhinitis, cause unspecified   . Anemia, unspecified 04/05/2011  . Benign neoplasm of colon   . Congestive heart failure, unspecified   . Coronary atherosclerosis of unspecified type of vessel, native or graft   . Depressive disorder, not elsewhere classified   . Disorder of bone and cartilage, unspecified   .  Diverticulosis of colon (without mention of hemorrhage)   . Esophageal reflux   . Gout flare 02/06/2011  . Gout, unspecified   . Headache(784.0)   . Morbid obesity ([place])   . Need for prophylactic vaccination and inoculation against influenza   . Osteoarthrosis, unspecified whether generalized or localized, unspecified site   . Other and unspecified hyperlipidemia   . Other malaise and fatigue   . Pneumonia   . Unspecified essential hypertension   . Unspecified glaucoma(365.9)      CURRENT MEDICATIONS: Reviewed  Patient's Medications  New Prescriptions   No medications on file  Previous Medications   ACETAMINOPHEN (TYLENOL) 325 MG TABLET    Take 650 mg by mouth every 4 (four) hours as needed for mild pain or fever.    ALBUTEROL (PROVENTIL) (2.5 MG/3ML) 0.083% NEBULIZER SOLUTION    Take 2.5 mg by nebulization every 4 (four) hours as needed for wheezing or shortness of breath. x2 weeks   CHOLECALCIFEROL (VITAMIN D3) 2000 UNITS TABS    Take 1 tablet by mouth daily.   FAMOTIDINE (PEPCID) 20 MG TABLET    Take 20 mg by mouth at bedtime.    FUROSEMIDE (LASIX) 20 MG TABLET    Take 20 mg by mouth daily.    LOPERAMIDE HCL (IMODIUM PO)    Take 1 tablet by mouth 2 (two) times daily as needed (diarrhea).  2 mg   METOPROLOL TARTRATE (LOPRESSOR) 25 MG TABLET    Take 12.5 mg by mouth 2 (two) times daily.    OXYGEN    Inhale 2 L into the lungs as needed.   POLYETHYLENE GLYCOL (MIRALAX / GLYCOLAX) PACKET    Take 17 g by mouth daily as needed.   POLYVINYL ALCOHOL (LIQUIFILM TEARS) 1.4 % OPHTHALMIC SOLUTION    Place 1 drop into both eyes at bedtime.   POTASSIUM CHLORIDE SA (K-DUR,KLOR-CON) 20 MEQ TABLET    Take 20 mEq by mouth daily. With lasix.   TRAZODONE (DESYREL) 50 MG TABLET    Take 25 mg by mouth. Take 1/2 tablet to = 25 mg qd prn and qhs scheduled.  Modified Medications   No medications on file  Discontinued Medications   No medications on file     Allergies  Allergen Reactions  .  Sulfonamide Derivatives Other (See Comments)    unknown  . Tramadol      REVIEW OF SYSTEMS:  Unable to obtain due to advanced dementia   PHYSICAL EXAMINATION  GENERAL APPEARANCE: Well nourished. In no acute distress. Obese SKIN:  Skin is warm and dry. There are no suspicious lesions or rash HEAD: Normal in size and contour. No evidence of trauma EYES: Lids open and close normally. No blepharitis, entropion or ectropion. PERRL. Conjunctivae are clear and sclerae are white. Lenses are without opacity EARS: Pinnae are normal. Patient hears normal voice tunes of the examiner MOUTH and THROAT: Lips are without lesions. Oral mucosa is moist and without lesions. Tongue is normal in shape, size, and color and without lesions NECK: supple, trachea midline, no neck masses, no thyroid tenderness, no thyromegaly LYMPHATICS: no LAN in the neck, no supraclavicular LAN RESPIRATORY: breathing is even & unlabored, BS CTAB CARDIAC: RRR, no murmur,no extra heart sounds, no edema GI: abdomen soft, normal BS, no masses, no tenderness, no hepatomegaly, no splenomegaly EXTREMITIES:  Able to move 4 extremities, BLE generalized weakness, limited ROM on bilateral shoulders PSYCHIATRIC:  Disoriented X 3. Affect and behavior are appropriate    LABS/RADIOLOGY: Labs reviewed: Basic Metabolic Panel:  Recent Labs  02/10/16  NA 144  K 4.5  BUN 30*  CREATININE 1.1   Liver Function Tests:  Recent Labs  02/10/16  AST 9*  ALT 8  ALKPHOS 81    CBC:  Recent Labs  02/10/16  WBC 8.7  HGB 9.1*  HCT 29*  PLT 241    ASSESSMENT/PLAN:  Chronic kidney disease, stage III - stable  Anemia of chronic disease - stable Lab Results  Component Value Date   HGB 9.1 (A) 02/10/2016   Dry eyes - continue Artificial tears 1.4% 1 drop to each eye Q evening  Chronic systolic congestive heart failure -  No SOB nor edema; continue Lasix 20 mg 1 tab by mouth daily and Metoprolol Tartrate 25 mg 1/2 tab = 12.5 mg  PO BID,  weigh Q week   Hypokalemia - continue KCL ER 20 meq 1 tab PO Q D Lab Results  Component Value Date   K 4.5 02/10/2016     Goals of care:  Long-term care   Adalberto Metzgar C. [place]  -  NP [employer] [phone]

## 2016-05-23 ENCOUNTER — Non-Acute Institutional Stay (SKILLED_NURSING_FACILITY): Payer: Medicare Other | Admitting: Adult Health

## 2016-05-23 ENCOUNTER — Encounter: Payer: Self-pay | Admitting: Adult Health

## 2016-05-23 DIAGNOSIS — G4700 Insomnia, unspecified: Secondary | ICD-10-CM | POA: Diagnosis not present

## 2016-05-23 DIAGNOSIS — E559 Vitamin D deficiency, unspecified: Secondary | ICD-10-CM | POA: Diagnosis not present

## 2016-05-23 DIAGNOSIS — I10 Essential (primary) hypertension: Secondary | ICD-10-CM | POA: Diagnosis not present

## 2016-05-23 DIAGNOSIS — K5900 Constipation, unspecified: Secondary | ICD-10-CM

## 2016-05-23 DIAGNOSIS — F0150 Vascular dementia without behavioral disturbance: Secondary | ICD-10-CM

## 2016-05-23 DIAGNOSIS — K219 Gastro-esophageal reflux disease without esophagitis: Secondary | ICD-10-CM | POA: Diagnosis not present

## 2016-05-23 NOTE — Progress Notes (Signed)
DATE:  05/23/2016   MRN:  [id number]  BIRTHDAY: 21-Dec-1921  Facility:  Nursing Home Location:  [place] [facility] [place] Room Number: 1105-A  LEVEL OF CARE:  SNF (31)  Contact Information    Name Relation Home Work [place] Relative [place] Son [phone]     Shakeila, Pfarr [phone]     Arbie, Reisz [phone]         Code Status History    Date Active Date Inactive Code Status Order ID Comments User Context   03/24/2013  8:34 PM 03/27/2013  8:38 PM Full Code [id number]  Hosie Poisson, MD Inpatient   09/10/2012  7:57 PM 09/16/2012  7:24 PM Full Code [id number]  Berle Mull, MD Inpatient   01/29/2011 10:06 PM 02/02/2011  2:32 PM DNR [id number]  Ernst Spell, RN Inpatient    Advance Directive Documentation     Most Recent Value  Type of Advance Directive  Healthcare Power of Attorney, Living will, Out of facility DNR (pink MOST or yellow form)  Pre-existing out of facility DNR order (yellow form or pink MOST form)  -  "MOST" Form in Place?  -       Chief Complaint  Patient presents with  . Medical Management of Chronic Issues    HISTORY OF PRESENT ILLNESS:  This is a 6-YO female seen for a routine visit. Trazodone PRN was discontinued after 2 weeks and continues to take Trazodone 25 mg  Q HS routinely.  She is followed-[facility] by [facility].     PAST MEDICAL HISTORY:  Past Medical History:  Diagnosis Date  . Acute duodenal ulcer with hemorrhage 03/26/2013  . Acute myocardial infarction, unspecified site, episode of care unspecified   . Allergic rhinitis, cause unspecified   . Anemia, unspecified 04/05/2011  . Benign neoplasm of colon   . Congestive heart failure, unspecified   . Coronary atherosclerosis of unspecified type of vessel, native or graft   . Depressive disorder, not elsewhere classified   . Disorder of bone and  cartilage, unspecified   . Diverticulosis of colon (without mention of hemorrhage)   . Esophageal reflux   . Gout flare 02/06/2011  . Gout, unspecified   . Headache(784.0)   . Morbid obesity ([place])   . Need for prophylactic vaccination and inoculation against influenza   . Osteoarthrosis, unspecified whether generalized or localized, unspecified site   . Other and unspecified hyperlipidemia   . Other malaise and fatigue   . Pneumonia   . Unspecified essential hypertension   . Unspecified glaucoma(365.9)      CURRENT MEDICATIONS: Reviewed  Patient's Medications  New Prescriptions   No medications on file  Previous Medications   ACETAMINOPHEN (TYLENOL) 325 MG TABLET    Take 650 mg by mouth every 4 (four) hours as needed for mild pain or fever.    ALBUTEROL (PROVENTIL) (2.5 MG/3ML) 0.083% NEBULIZER SOLUTION    Take 2.5 mg by nebulization every 4 (four) hours as needed for wheezing or shortness of breath. x2 weeks   CHOLECALCIFEROL (VITAMIN D3) 2000 UNITS TABS    Take 1 tablet by mouth daily.   FAMOTIDINE (PEPCID) 20 MG TABLET    Take 20 mg by mouth at bedtime.    FUROSEMIDE (LASIX) 20 MG TABLET    Take 20 mg by mouth daily.    LOPERAMIDE HCL (IMODIUM PO)    Take  1 tablet by mouth 2 (two) times daily as needed (diarrhea). 2 mg   METOPROLOL TARTRATE (LOPRESSOR) 25 MG TABLET    Take 12.5 mg by mouth 2 (two) times daily.    NUTRITIONAL SUPPLEMENT LIQD    Take 120 mLs by mouth daily. MedPass   OXYGEN    Inhale 2 L into the lungs as needed.   POLYETHYLENE GLYCOL (MIRALAX / GLYCOLAX) PACKET    Take 17 g by mouth daily as needed.   POLYVINYL ALCOHOL (LIQUIFILM TEARS) 1.4 % OPHTHALMIC SOLUTION    Place 1 drop into both eyes at bedtime.   POTASSIUM CHLORIDE SA (K-DUR,KLOR-CON) 20 MEQ TABLET    Take 20 mEq by mouth daily. With lasix.   TRAZODONE (DESYREL) 50 MG TABLET    Take 25 mg by mouth at bedtime.   Modified Medications   No medications on file  Discontinued Medications   No medications  on file     Allergies  Allergen Reactions  . Sulfonamide Derivatives Other (See Comments)    unknown  . Tramadol      REVIEW OF SYSTEMS:  Unable to obtain due to advanced dementia   PHYSICAL EXAMINATION  GENERAL APPEARANCE: Well nourished. In no acute distress. Obese SKIN:  Skin is warm and dry.  HEAD: Normal in size and contour. No evidence of trauma EYES: Lids open and close normally. No blepharitis, entropion or ectropion. PERRL. Conjunctivae are clear and sclerae are white. Lenses are without opacity EARS: Pinnae are normal. Patient hears normal voice tunes of the examiner MOUTH and THROAT: Lips are without lesions. Oral mucosa is moist and without lesions. Tongue is normal in shape, size, and color and without lesions NECK: supple, trachea midline, no neck masses, no thyroid tenderness, no thyromegaly LYMPHATICS: no LAN in the neck, no supraclavicular LAN RESPIRATORY: breathing is even & unlabored, BS CTAB CARDIAC: RRR, no murmur,no extra heart sounds, BLE 1+ edema GI: abdomen soft, normal BS, no masses, no tenderness, no hepatomegaly, no splenomegaly EXTREMITIES:  Able to move X 4 extremities, BLE generalized weakness, uses hoyer lift PSYCHIATRIC: Disoriented X 3. Affect and behavior are appropriate   LABS/RADIOLOGY: Labs reviewed: Basic Metabolic Panel:  Recent Labs  02/10/16  NA 144  K 4.5  BUN 30*  CREATININE 1.1   Liver Function Tests:  Recent Labs  02/10/16  AST 9*  ALT 8  ALKPHOS 81   CBC:  Recent Labs  02/10/16  WBC 8.7  HGB 9.1*  HCT 29*  PLT 241   ASSESSMENT/PLAN:  Hypertension - well-controlled; continue Metoprolol tartrate 25 mg 1/2 tab = 12.5 mg PO BID  GERD - no complaints of abdominal pain; continue Pepcid 20 mg 1 tab PO Q HS  Vitamin D deficiency - continue Vitamin D3 2000 units PO Q D  Constipation - continue Miralax 17 gm PO Q D PRN  Insomnia - continue Trazodone 50 mg 1/2 tab = 25 mg PO Q HS and  25 mg PRN was recently  discontinued  Vascular dementia - continue supportive care, comfort/hospice care, fall precautions    Goals of care:  Long-term care, comfort care/hospice   Britteney Ayotte C. [place] - NP   [employer] [phone]

## 2016-05-25 LAB — BASIC METABOLIC PANEL
BUN: 31 mg/dL — AB (ref 4–21)
CREATININE: 1.4 mg/dL — AB (ref 0.5–1.1)
Glucose: 82 mg/dL
POTASSIUM: 4.5 mmol/L (ref 3.4–5.3)
Sodium: 149 mmol/L — AB (ref 137–147)

## 2016-05-25 LAB — CBC AND DIFFERENTIAL
HCT: 27 % — AB (ref 36–46)
Hemoglobin: 8.2 g/dL — AB (ref 12.0–16.0)
Platelets: 247 10*3/uL (ref 150–399)
WBC: 7.6 10^3/mL

## 2016-05-30 LAB — BASIC METABOLIC PANEL
BUN: 27 mg/dL — AB (ref 4–21)
Creatinine: 1.2 mg/dL — AB (ref 0.5–1.1)
GLUCOSE: 83 mg/dL
Potassium: 4.1 mmol/L (ref 3.4–5.3)
Sodium: 145 mmol/L (ref 137–147)

## 2016-06-08 IMAGING — CT CT HEAD W/O CM
2 series · 15 of 30 positions shown, 19 images · non-contrast
Comparison: September 10, 2012

CLINICAL DATA: Patient found unresponsive/ altered mental status

EXAM:
CT HEAD WITHOUT CONTRAST
TECHNIQUE: Contiguous axial images were obtained from the base of the skull
through the vertex without intravenous contrast.

[Series 2: head w/o · axial · non-contrast · 0.47mm/px · z∈[-174,-29]mm · 13 of 35 slices shown, 17 images]
[im 3/35  brain]
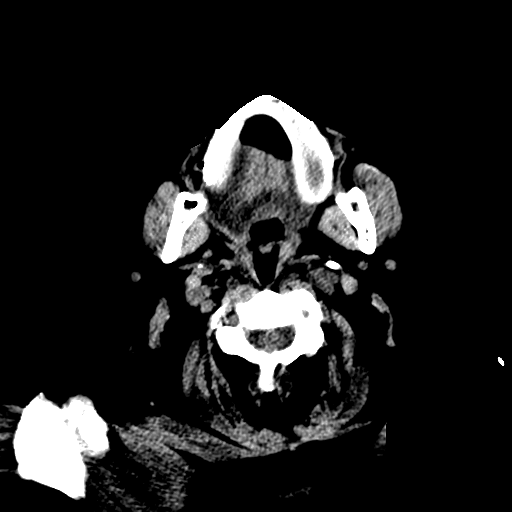
[im 3/35  bone]
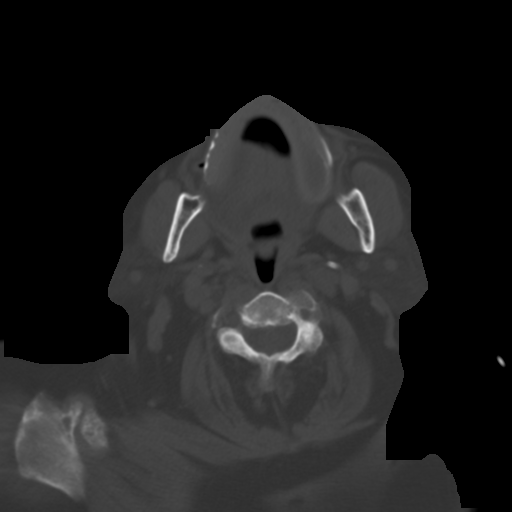
[im 5/35  brain]
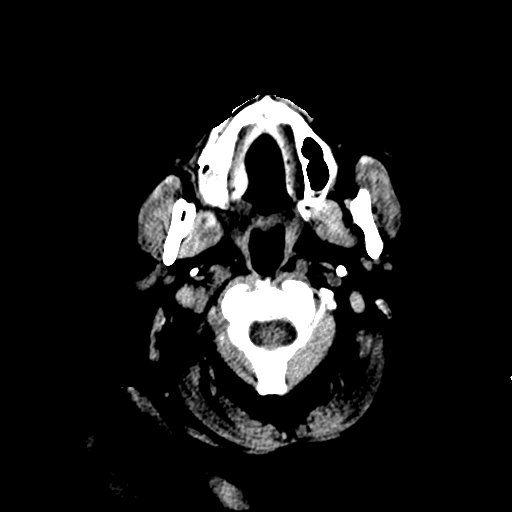
[im 8/35  brain]
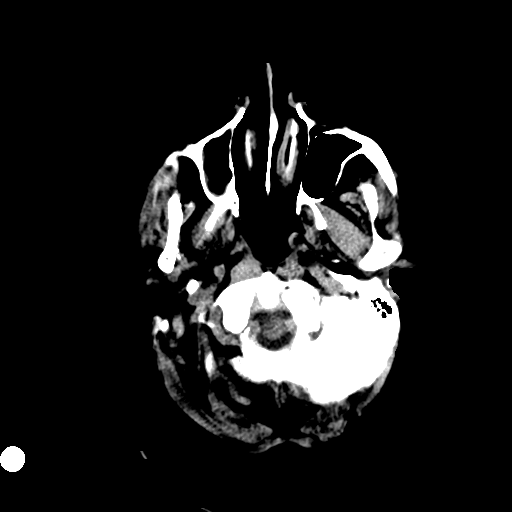
[im 10/35  brain]
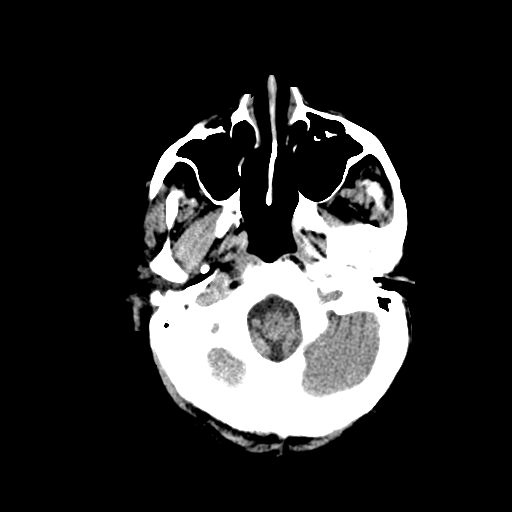
[im 13/35  brain]
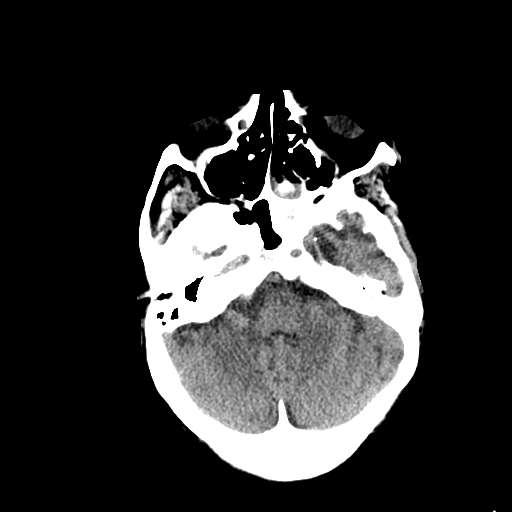
[im 13/35  bone]
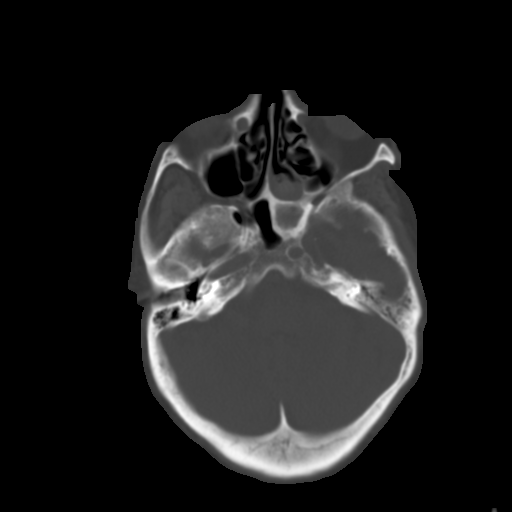
[im 15/35  brain]
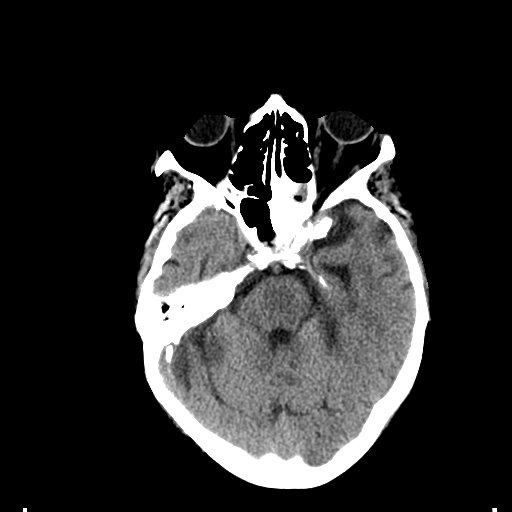
[im 18/35  brain]
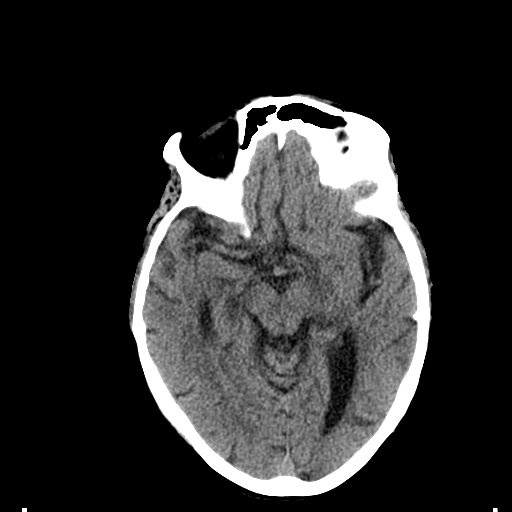
[im 20/35  brain]
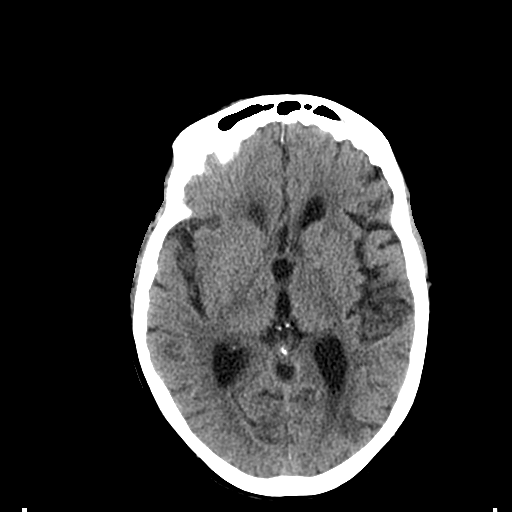
[im 22/35  brain]
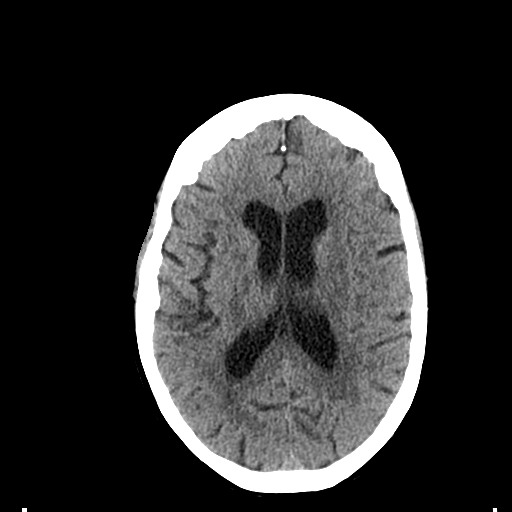
[im 22/35  bone]
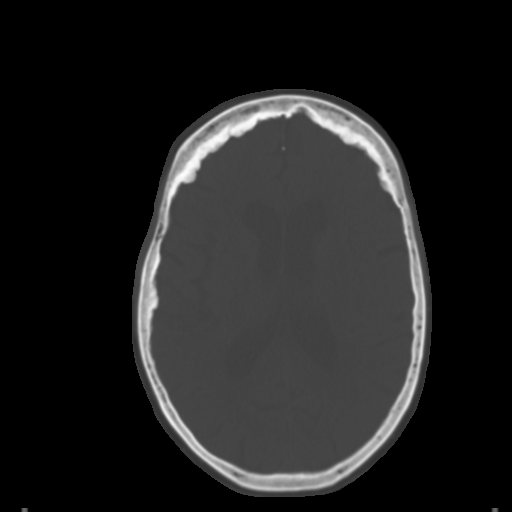
[im 25/35  brain]
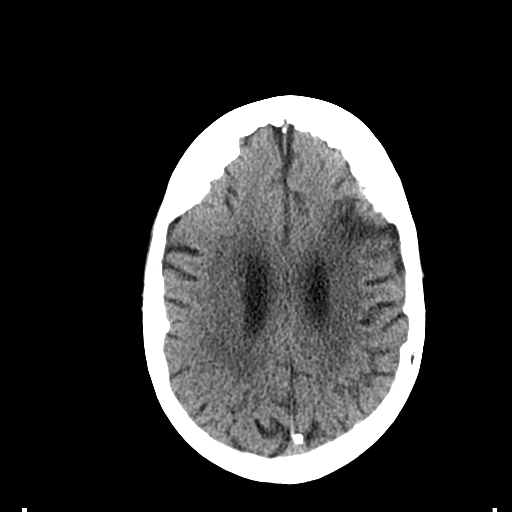
[im 27/35  brain]
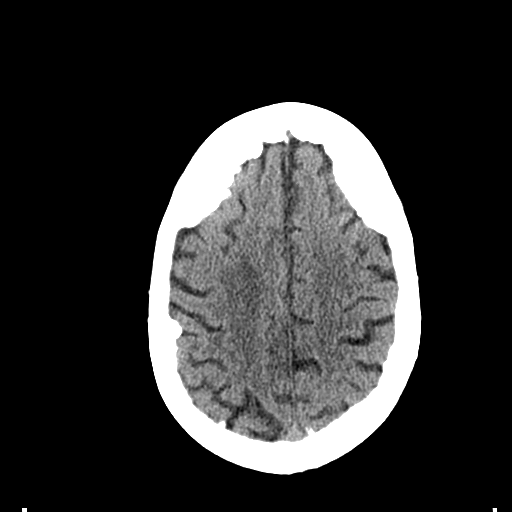
[im 30/35  brain]
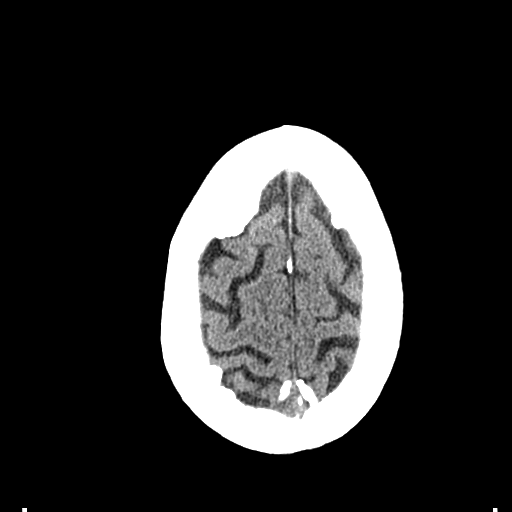
[im 32/35  brain]
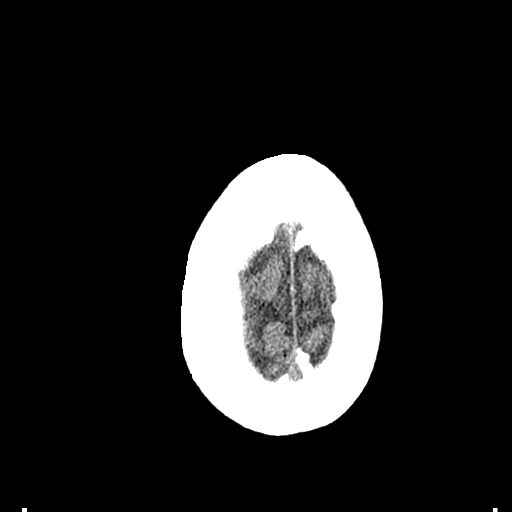
[im 32/35  bone]
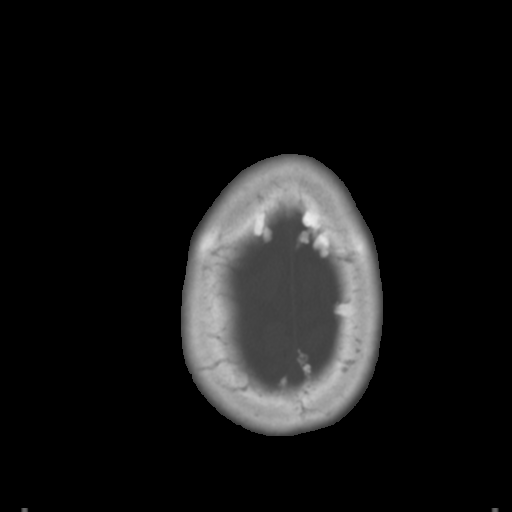

[Series 3: bone windows · axial · 0.47mm/px · z∈[-174,-149]mm · 2 of 35 slices shown]
[im 3/35  bone]
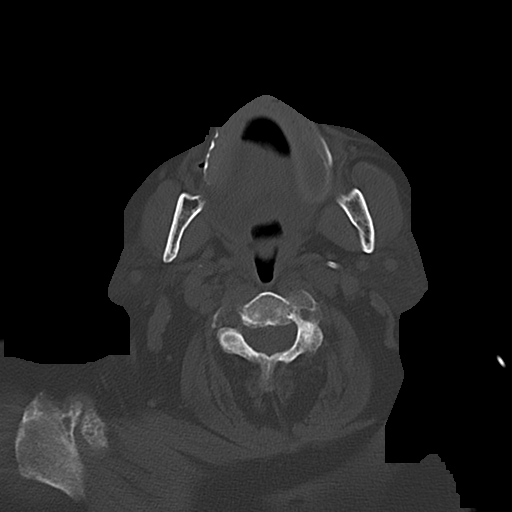
[im 8/35  bone]
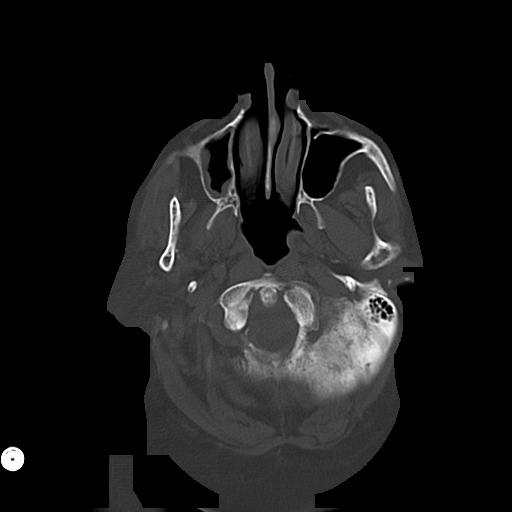

[15 of 30 positions shown; findings below may reference images not displayed]

FINDINGS: Mild diffuse atrophy is stable. There is no intracranial mass,
hemorrhage, extra-axial fluid collection, or midline shift. There is
evidence of an acute infarct in the mid to posterior left frontal
lobe. No other acute infarct apparent. There is mild patchy
periventricular small vessel disease in the centra semiovale
bilaterally.

Bony calvarium appears intact. Mastoid air cells are clear. There is
frontal hyperostosis bilaterally. There is extensive left sphenoid
and posterior left ethmoid sinus disease. There is a retention cyst
in the inferior left maxillary antrum. Cataract noted on the left.
IMPRESSION: Acute appearing infarct in the mid posterior left frontal lobe.
There is atrophy with patchy periventricular small vessel disease.
No hemorrhage or mass. Areas of paranasal sinus disease on the left.
Cataract noted in left lens.

## 2016-07-04 ENCOUNTER — Non-Acute Institutional Stay (SKILLED_NURSING_FACILITY): Payer: Medicare Other | Admitting: Internal Medicine

## 2016-07-04 ENCOUNTER — Encounter: Payer: Self-pay | Admitting: Internal Medicine

## 2016-07-04 DIAGNOSIS — I2510 Atherosclerotic heart disease of native coronary artery without angina pectoris: Secondary | ICD-10-CM

## 2016-07-04 DIAGNOSIS — K219 Gastro-esophageal reflux disease without esophagitis: Secondary | ICD-10-CM

## 2016-07-04 DIAGNOSIS — G4700 Insomnia, unspecified: Secondary | ICD-10-CM

## 2016-07-04 DIAGNOSIS — M19012 Primary osteoarthritis, left shoulder: Secondary | ICD-10-CM

## 2016-07-04 DIAGNOSIS — N183 Chronic kidney disease, stage 3 unspecified: Secondary | ICD-10-CM

## 2016-07-04 DIAGNOSIS — F0150 Vascular dementia without behavioral disturbance: Secondary | ICD-10-CM

## 2016-07-04 DIAGNOSIS — I5022 Chronic systolic (congestive) heart failure: Secondary | ICD-10-CM | POA: Diagnosis not present

## 2016-07-04 DIAGNOSIS — M19011 Primary osteoarthritis, right shoulder: Secondary | ICD-10-CM | POA: Diagnosis not present

## 2016-07-04 DIAGNOSIS — D638 Anemia in other chronic diseases classified elsewhere: Secondary | ICD-10-CM

## 2016-07-04 DIAGNOSIS — I10 Essential (primary) hypertension: Secondary | ICD-10-CM | POA: Diagnosis not present

## 2016-07-04 NOTE — Progress Notes (Signed)
LOCATION: [facility]  PCP: Blanchie Serve, MD   Code Status: DNR  Goals of care: Advanced Directive information Advanced Directives 05/23/2016  Does Patient Have a Medical Advance Directive? Yes  Type of Paramedic of [place];Living will;Out of facility DNR (pink MOST or yellow form)  Does patient want to make changes to medical advance directive? No - Patient declined  Copy of [place] in Chart? Yes  Would patient like information on creating a medical advance directive? -  Pre-existing out of facility DNR order (yellow form or pink MOST form) -       Extended Emergency Contact Information Primary Emergency Contact: Selena Batten, [place] [address] [place] Phone: [phone] Relation: Relative Secondary Emergency Contact: Robin Searing, [place] [address] [place] Phone: [phone] Relation: Friend   Allergies  Allergen Reactions  . Sulfonamide Derivatives Other (See Comments)    unknown  . Tramadol     Chief Complaint  Patient presents with  . Medical Management of Chronic Issues    Routine Visit      HPI:  Patient is a 83 y.o. female seen today for routine visit. She has been at her baseline from nursing. She is complaint with her medications. She is pleasantly confused and participated minimally in conversation. She is transferred via hoyer lift. She is now under hospice services.   Review of Systems: limited with her dementia.  Constitutional: Negative for fever HENT: Negative for headache Respiratory: Negative for cough, shortness of breath Cardiovascular: Negative for chest pain Gastrointestinal: Negative for vomiting, abdominal pain.  Genitourinary: Negative for dysuria Musculoskeletal: Negative for fall in the facility.  Skin: Negative for itching, rash.  Psychiatry: has dementia   Past Medical History:  Diagnosis Date  .  Acute duodenal ulcer with hemorrhage 03/26/2013  . Acute myocardial infarction, unspecified site, episode of care unspecified   . Allergic rhinitis, cause unspecified   . Anemia, unspecified 04/05/2011  . Benign neoplasm of colon   . Congestive heart failure, unspecified   . Coronary atherosclerosis of unspecified type of vessel, native or graft   . Depressive disorder, not elsewhere classified   . Disorder of bone and cartilage, unspecified   . Diverticulosis of colon (without mention of hemorrhage)   . Esophageal reflux   . Gout flare 02/06/2011  . Gout, unspecified   . Headache(784.0)   . Morbid obesity ([place])   . Need for prophylactic vaccination and inoculation against influenza   . Osteoarthrosis, unspecified whether generalized or localized, unspecified site   . Other and unspecified hyperlipidemia   . Other malaise and fatigue   . Pneumonia   . Unspecified essential hypertension   . Unspecified glaucoma(365.9)    Past Surgical History:  Procedure Laterality Date  . CORONARY ANGIOPLASTY WITH STENT PLACEMENT     RCA stent  . ECTOPIC PREGNANCY SURGERY    . ESOPHAGOGASTRODUODENOSCOPY N/A 03/26/2013   Procedure: ESOPHAGOGASTRODUODENOSCOPY (EGD);  Surgeon: Gatha Mayer, MD;  Location: [facility] ENDOSCOPY;  Service: Endoscopy;  Laterality: N/A;  . LEFT HEART CATHETERIZATION WITH CORONARY ANGIOGRAM N/A 09/11/2012   Procedure: LEFT HEART CATHETERIZATION WITH CORONARY ANGIOGRAM;  Surgeon: Larey Dresser, MD;  Location: [facility] CATH LAB;  Service: Cardiovascular;  Laterality: N/A;  . Left hip replacement     s/p 2002. Secondary to DJD  . VESICOVAGINAL FISTULA CLOSURE W/ TAH  Social History:   reports that she quit smoking about 26 years ago. She has never used smokeless tobacco. She reports that she does not drink alcohol or use drugs.  Family History  Problem Relation Age of Onset  . Breast cancer Sister   . Hypertension Father   . Stroke Father   . Colon cancer Neg Hx      Medications: Allergies as of 07/04/2016      Reactions   Sulfonamide Derivatives Other (See Comments)   unknown   Tramadol       Medication List       Accurate as of 07/04/16 11:37 AM. Always use your most recent med list.          acetaminophen 325 MG tablet Commonly known as:  TYLENOL Take 650 mg by mouth every 4 (four) hours as needed for mild pain or fever.   albuterol (2.5 MG/3ML) 0.083% nebulizer solution Commonly known as:  PROVENTIL Take 2.5 mg by nebulization every 4 (four) hours as needed for wheezing or shortness of breath. x2 weeks   famotidine 20 MG tablet Commonly known as:  PEPCID Take 20 mg by mouth at bedtime.   IMODIUM PO Take 1 tablet by mouth 2 (two) times daily as needed (diarrhea). 2 mg   metoprolol tartrate 25 MG tablet Commonly known as:  LOPRESSOR Take 12.5 mg by mouth 2 (two) times daily.   NUTRITIONAL SUPPLEMENT Liqd Take 120 mLs by mouth daily. MedPass   OXYGEN Inhale 2 L into the lungs as needed.   polyethylene glycol packet Commonly known as:  MIRALAX / GLYCOLAX Take 17 g by mouth daily as needed.   polyvinyl alcohol 1.4 % ophthalmic solution Commonly known as:  LIQUIFILM TEARS Place 1 drop into both eyes at bedtime.   traZODone 50 MG tablet Commonly known as:  DESYREL Take 25 mg by mouth at bedtime.   Vitamin D3 2000 units Tabs Take 1 tablet by mouth daily.       Immunizations: Immunization History  Administered Date(s) Administered  . H1N1 01/23/2008  . Influenza Whole 12/16/2007, 12/14/2009     Physical Exam:  Vitals:   07/04/16 1119  BP: 134/65  Pulse: 72  Resp: 18  Temp: (!) 96.6 F (35.9 C)  TempSrc: Oral  SpO2: 94%  Weight: 189 lb (85.7 kg)  Height: 5\' 1"  (1.549 m)   Body mass index is 35.71 kg/m.  General- elderly female, Obese, in no acute distress Head- normocephalic, atraumatic Nose- no nasal discharge Throat- moist mucus membrane Eyes- PERRLA, EOMI, no pallor, no icterus, no  discharge Neck- no cervical lymphadenopathy Cardiovascular- normal s1,s2, no murmur, no leg edema Respiratory- bilateral clear to auscultation, no wheeze, no rhonchi, no crackles, no use of accessory muscles Abdomen- bowel sounds present, soft, non tender Musculoskeletal- able to move all 4 extremities, generalized weakness, limited range of motion to both her shoulders, weakness to her legs, trace leg edema Neurological- alert and oriented to self only Skin- warm and dry Psychiatry- normal mood and affect   LABS  CBC Latest Ref Rng & Units 05/25/2016 02/10/2016 11/27/2014  WBC 10:3/mL 7.6 8.7 13.7(H)  Hemoglobin 12.0 - 16.0 g/dL 8.2(A) 9.1(A) 9.2(L)  Hematocrit 36 - 46 % 27(A) 29(A) 29.2(L)  Platelets 150 - 399 K/L 247 241 315    CMP Latest Ref Rng & Units 05/30/2016 05/25/2016 02/10/2016  Glucose 65 - 99 mg/dL - - -  BUN 4 - 21 mg/dL 27(A) 31(A) 30(A)  Creatinine 0.5 - 1.1 mg/dL  1.2(A) 1.4(A) 1.1  Sodium 137 - 147 mmol/L 145 149(A) 144  Potassium 3.4 - 5.3 mmol/L 4.1 4.5 4.5  Chloride 101 - 111 mmol/L - - -  CO2 22 - 32 mmol/L - - -  Calcium 8.9 - 10.3 mg/dL - - -  Total Protein 6.5 - 8.1 g/dL - - -  Total Bilirubin 0.3 - 1.2 mg/dL - - -  Alkaline Phos 25 - 125 U/L - - 81  AST 13 - 35 U/L - - 9(A)  ALT 7 - 35 U/L - - 8    Assessment/Plan  Insomnia Continue her trazodone 25 mg qhs  Chronic systolic Congestive heart failure Appears euvolemic. Continue metoprolol 12.5 mg bid  gerd Continue pepcid and monitor clinically  Vascular dementia without behavioral disturbance Provide supportive care. Fall precautions and pressure ulcer prophylaxis to be taken.  ckd stage 3 Maintain hydration and monitor clinically  Hypertension Monitor blood pressure reading. Stable BP readings. Continue metoprolol tartrate 12.5 mg twice a day.  Coronary artery disease Remains chest pain-free. Continue beta blocker. Monitor  Osteoarthritis Continue Tylenol 650 mg every 4 hours as  needed for pain.  Anemia of chronic disease Monitor clinically, no bleed reported   No lab work per family request  Family/ staff Communication: reviewed care plan with patient and nursing supervisor    Blanchie Serve, MD Internal Medicine [place], [place] [address] Cell Phone (Monday-Friday 8 am - 5 pm): [phone] On Call: [phone] and follow prompts after 5 pm and on weekends Office Phone: [phone] Office Fax: [phone]

## 2017-09-26 ENCOUNTER — Encounter: Payer: Self-pay | Admitting: Internal Medicine

## 2019-02-19 DIAGNOSIS — U071 COVID-19: Secondary | ICD-10-CM | POA: Diagnosis not present

## 2019-02-21 DIAGNOSIS — I70244 Atherosclerosis of native arteries of left leg with ulceration of heel and midfoot: Secondary | ICD-10-CM | POA: Diagnosis not present

## 2019-02-26 DIAGNOSIS — U071 COVID-19: Secondary | ICD-10-CM | POA: Diagnosis not present

## 2019-03-06 DIAGNOSIS — U071 COVID-19: Secondary | ICD-10-CM | POA: Diagnosis not present

## 2019-03-21 DIAGNOSIS — I70244 Atherosclerosis of native arteries of left leg with ulceration of heel and midfoot: Secondary | ICD-10-CM | POA: Diagnosis not present

## 2019-03-27 DIAGNOSIS — L89890 Pressure ulcer of other site, unstageable: Secondary | ICD-10-CM | POA: Diagnosis not present

## 2019-03-28 DIAGNOSIS — I129 Hypertensive chronic kidney disease with stage 1 through stage 4 chronic kidney disease, or unspecified chronic kidney disease: Secondary | ICD-10-CM | POA: Diagnosis not present

## 2019-03-28 DIAGNOSIS — K219 Gastro-esophageal reflux disease without esophagitis: Secondary | ICD-10-CM | POA: Diagnosis not present

## 2019-03-28 DIAGNOSIS — E790 Hyperuricemia without signs of inflammatory arthritis and tophaceous disease: Secondary | ICD-10-CM | POA: Diagnosis not present

## 2019-03-28 DIAGNOSIS — D538 Other specified nutritional anemias: Secondary | ICD-10-CM | POA: Diagnosis not present

## 2019-03-28 DIAGNOSIS — I5189 Other ill-defined heart diseases: Secondary | ICD-10-CM | POA: Diagnosis not present

## 2019-03-28 DIAGNOSIS — I2510 Atherosclerotic heart disease of native coronary artery without angina pectoris: Secondary | ICD-10-CM | POA: Diagnosis not present

## 2019-03-28 DIAGNOSIS — F0280 Dementia in other diseases classified elsewhere without behavioral disturbance: Secondary | ICD-10-CM | POA: Diagnosis not present

## 2019-03-28 DIAGNOSIS — U071 COVID-19: Secondary | ICD-10-CM | POA: Diagnosis not present

## 2019-03-28 DIAGNOSIS — R946 Abnormal results of thyroid function studies: Secondary | ICD-10-CM | POA: Diagnosis not present

## 2019-03-28 DIAGNOSIS — R799 Abnormal finding of blood chemistry, unspecified: Secondary | ICD-10-CM | POA: Diagnosis not present

## 2019-03-28 DIAGNOSIS — G478 Other sleep disorders: Secondary | ICD-10-CM | POA: Diagnosis not present

## 2019-03-28 DIAGNOSIS — R4702 Dysphasia: Secondary | ICD-10-CM | POA: Diagnosis not present

## 2019-04-03 DIAGNOSIS — U071 COVID-19: Secondary | ICD-10-CM | POA: Diagnosis not present

## 2019-04-10 DIAGNOSIS — U071 COVID-19: Secondary | ICD-10-CM | POA: Diagnosis not present

## 2019-04-10 DIAGNOSIS — L89620 Pressure ulcer of left heel, unstageable: Secondary | ICD-10-CM | POA: Diagnosis not present

## 2019-04-17 DIAGNOSIS — U071 COVID-19: Secondary | ICD-10-CM | POA: Diagnosis not present

## 2019-04-18 DIAGNOSIS — I70244 Atherosclerosis of native arteries of left leg with ulceration of heel and midfoot: Secondary | ICD-10-CM | POA: Diagnosis not present

## 2019-04-25 DIAGNOSIS — U071 COVID-19: Secondary | ICD-10-CM | POA: Diagnosis not present

## 2019-05-02 DIAGNOSIS — U071 COVID-19: Secondary | ICD-10-CM | POA: Diagnosis not present

## 2019-05-08 DIAGNOSIS — L89890 Pressure ulcer of other site, unstageable: Secondary | ICD-10-CM | POA: Diagnosis not present

## 2019-05-27 DIAGNOSIS — E790 Hyperuricemia without signs of inflammatory arthritis and tophaceous disease: Secondary | ICD-10-CM | POA: Diagnosis not present

## 2019-05-27 DIAGNOSIS — I5189 Other ill-defined heart diseases: Secondary | ICD-10-CM | POA: Diagnosis not present

## 2019-05-27 DIAGNOSIS — I7389 Other specified peripheral vascular diseases: Secondary | ICD-10-CM | POA: Diagnosis not present

## 2019-05-27 DIAGNOSIS — I129 Hypertensive chronic kidney disease with stage 1 through stage 4 chronic kidney disease, or unspecified chronic kidney disease: Secondary | ICD-10-CM | POA: Diagnosis not present

## 2019-05-27 DIAGNOSIS — G478 Other sleep disorders: Secondary | ICD-10-CM | POA: Diagnosis not present

## 2019-05-27 DIAGNOSIS — F0280 Dementia in other diseases classified elsewhere without behavioral disturbance: Secondary | ICD-10-CM | POA: Diagnosis not present

## 2019-05-27 DIAGNOSIS — D538 Other specified nutritional anemias: Secondary | ICD-10-CM | POA: Diagnosis not present

## 2019-05-27 DIAGNOSIS — R4702 Dysphasia: Secondary | ICD-10-CM | POA: Diagnosis not present

## 2019-05-28 DIAGNOSIS — E559 Vitamin D deficiency, unspecified: Secondary | ICD-10-CM | POA: Diagnosis not present

## 2019-05-28 DIAGNOSIS — D519 Vitamin B12 deficiency anemia, unspecified: Secondary | ICD-10-CM | POA: Diagnosis not present

## 2019-05-29 DIAGNOSIS — I2510 Atherosclerotic heart disease of native coronary artery without angina pectoris: Secondary | ICD-10-CM | POA: Diagnosis not present

## 2019-05-29 DIAGNOSIS — E568 Deficiency of other vitamins: Secondary | ICD-10-CM | POA: Diagnosis not present

## 2019-05-29 DIAGNOSIS — D538 Other specified nutritional anemias: Secondary | ICD-10-CM | POA: Diagnosis not present

## 2019-05-29 DIAGNOSIS — F0280 Dementia in other diseases classified elsewhere without behavioral disturbance: Secondary | ICD-10-CM | POA: Diagnosis not present

## 2019-06-02 DIAGNOSIS — Z20822 Contact with and (suspected) exposure to covid-19: Secondary | ICD-10-CM | POA: Diagnosis not present

## 2019-07-29 DIAGNOSIS — M1049 Other secondary gout, multiple sites: Secondary | ICD-10-CM | POA: Diagnosis not present

## 2019-07-29 DIAGNOSIS — N184 Chronic kidney disease, stage 4 (severe): Secondary | ICD-10-CM | POA: Diagnosis not present

## 2019-07-29 DIAGNOSIS — G478 Other sleep disorders: Secondary | ICD-10-CM | POA: Diagnosis not present

## 2019-07-29 DIAGNOSIS — I5189 Other ill-defined heart diseases: Secondary | ICD-10-CM | POA: Diagnosis not present

## 2019-07-29 DIAGNOSIS — E568 Deficiency of other vitamins: Secondary | ICD-10-CM | POA: Diagnosis not present

## 2019-07-29 DIAGNOSIS — F0280 Dementia in other diseases classified elsewhere without behavioral disturbance: Secondary | ICD-10-CM | POA: Diagnosis not present

## 2019-07-29 DIAGNOSIS — M216X2 Other acquired deformities of left foot: Secondary | ICD-10-CM | POA: Diagnosis not present

## 2019-07-29 DIAGNOSIS — R1319 Other dysphagia: Secondary | ICD-10-CM | POA: Diagnosis not present

## 2019-08-21 DIAGNOSIS — I509 Heart failure, unspecified: Secondary | ICD-10-CM | POA: Diagnosis not present

## 2019-08-21 DIAGNOSIS — I2510 Atherosclerotic heart disease of native coronary artery without angina pectoris: Secondary | ICD-10-CM | POA: Diagnosis not present

## 2019-08-21 DIAGNOSIS — Z9181 History of falling: Secondary | ICD-10-CM | POA: Diagnosis not present

## 2019-08-21 DIAGNOSIS — R293 Abnormal posture: Secondary | ICD-10-CM | POA: Diagnosis not present

## 2019-08-26 DIAGNOSIS — I2510 Atherosclerotic heart disease of native coronary artery without angina pectoris: Secondary | ICD-10-CM | POA: Diagnosis not present

## 2019-08-26 DIAGNOSIS — R293 Abnormal posture: Secondary | ICD-10-CM | POA: Diagnosis not present

## 2019-08-26 DIAGNOSIS — Z9181 History of falling: Secondary | ICD-10-CM | POA: Diagnosis not present

## 2019-08-26 DIAGNOSIS — I509 Heart failure, unspecified: Secondary | ICD-10-CM | POA: Diagnosis not present

## 2019-08-27 DIAGNOSIS — I509 Heart failure, unspecified: Secondary | ICD-10-CM | POA: Diagnosis not present

## 2019-08-27 DIAGNOSIS — R293 Abnormal posture: Secondary | ICD-10-CM | POA: Diagnosis not present

## 2019-08-27 DIAGNOSIS — I2510 Atherosclerotic heart disease of native coronary artery without angina pectoris: Secondary | ICD-10-CM | POA: Diagnosis not present

## 2019-08-27 DIAGNOSIS — Z9181 History of falling: Secondary | ICD-10-CM | POA: Diagnosis not present

## 2019-08-28 DIAGNOSIS — R293 Abnormal posture: Secondary | ICD-10-CM | POA: Diagnosis not present

## 2019-08-28 DIAGNOSIS — Z9181 History of falling: Secondary | ICD-10-CM | POA: Diagnosis not present

## 2019-08-28 DIAGNOSIS — I2510 Atherosclerotic heart disease of native coronary artery without angina pectoris: Secondary | ICD-10-CM | POA: Diagnosis not present

## 2019-08-28 DIAGNOSIS — I509 Heart failure, unspecified: Secondary | ICD-10-CM | POA: Diagnosis not present

## 2019-08-29 DIAGNOSIS — R293 Abnormal posture: Secondary | ICD-10-CM | POA: Diagnosis not present

## 2019-08-29 DIAGNOSIS — I2510 Atherosclerotic heart disease of native coronary artery without angina pectoris: Secondary | ICD-10-CM | POA: Diagnosis not present

## 2019-08-29 DIAGNOSIS — Z9181 History of falling: Secondary | ICD-10-CM | POA: Diagnosis not present

## 2019-08-29 DIAGNOSIS — I509 Heart failure, unspecified: Secondary | ICD-10-CM | POA: Diagnosis not present

## 2019-09-01 DIAGNOSIS — I2510 Atherosclerotic heart disease of native coronary artery without angina pectoris: Secondary | ICD-10-CM | POA: Diagnosis not present

## 2019-09-01 DIAGNOSIS — I509 Heart failure, unspecified: Secondary | ICD-10-CM | POA: Diagnosis not present

## 2019-09-01 DIAGNOSIS — Z9181 History of falling: Secondary | ICD-10-CM | POA: Diagnosis not present

## 2019-09-01 DIAGNOSIS — R293 Abnormal posture: Secondary | ICD-10-CM | POA: Diagnosis not present

## 2019-09-02 DIAGNOSIS — I2510 Atherosclerotic heart disease of native coronary artery without angina pectoris: Secondary | ICD-10-CM | POA: Diagnosis not present

## 2019-09-02 DIAGNOSIS — R293 Abnormal posture: Secondary | ICD-10-CM | POA: Diagnosis not present

## 2019-09-02 DIAGNOSIS — Z9181 History of falling: Secondary | ICD-10-CM | POA: Diagnosis not present

## 2019-09-02 DIAGNOSIS — I509 Heart failure, unspecified: Secondary | ICD-10-CM | POA: Diagnosis not present

## 2019-09-19 DIAGNOSIS — Z20822 Contact with and (suspected) exposure to covid-19: Secondary | ICD-10-CM | POA: Diagnosis not present

## 2019-09-25 DIAGNOSIS — R532 Functional quadriplegia: Secondary | ICD-10-CM | POA: Diagnosis not present

## 2019-09-25 DIAGNOSIS — I129 Hypertensive chronic kidney disease with stage 1 through stage 4 chronic kidney disease, or unspecified chronic kidney disease: Secondary | ICD-10-CM | POA: Diagnosis not present

## 2019-09-25 DIAGNOSIS — I5189 Other ill-defined heart diseases: Secondary | ICD-10-CM | POA: Diagnosis not present

## 2019-09-25 DIAGNOSIS — N184 Chronic kidney disease, stage 4 (severe): Secondary | ICD-10-CM | POA: Diagnosis not present

## 2019-09-25 DIAGNOSIS — I7389 Other specified peripheral vascular diseases: Secondary | ICD-10-CM | POA: Diagnosis not present

## 2019-09-25 DIAGNOSIS — D538 Other specified nutritional anemias: Secondary | ICD-10-CM | POA: Diagnosis not present

## 2019-09-26 DIAGNOSIS — D649 Anemia, unspecified: Secondary | ICD-10-CM | POA: Diagnosis not present

## 2019-09-26 DIAGNOSIS — Z03818 Encounter for observation for suspected exposure to other biological agents ruled out: Secondary | ICD-10-CM | POA: Diagnosis not present

## 2019-09-26 DIAGNOSIS — E559 Vitamin D deficiency, unspecified: Secondary | ICD-10-CM | POA: Diagnosis not present

## 2019-09-26 DIAGNOSIS — R7989 Other specified abnormal findings of blood chemistry: Secondary | ICD-10-CM | POA: Diagnosis not present

## 2019-10-01 DIAGNOSIS — Z03818 Encounter for observation for suspected exposure to other biological agents ruled out: Secondary | ICD-10-CM | POA: Diagnosis not present

## 2019-10-06 DIAGNOSIS — Z20822 Contact with and (suspected) exposure to covid-19: Secondary | ICD-10-CM | POA: Diagnosis not present

## 2019-10-10 DIAGNOSIS — Z20822 Contact with and (suspected) exposure to covid-19: Secondary | ICD-10-CM | POA: Diagnosis not present

## 2019-10-13 DIAGNOSIS — Z20822 Contact with and (suspected) exposure to covid-19: Secondary | ICD-10-CM | POA: Diagnosis not present

## 2019-10-16 DIAGNOSIS — Z20822 Contact with and (suspected) exposure to covid-19: Secondary | ICD-10-CM | POA: Diagnosis not present

## 2019-10-21 DIAGNOSIS — Z03818 Encounter for observation for suspected exposure to other biological agents ruled out: Secondary | ICD-10-CM | POA: Diagnosis not present

## 2019-10-28 DIAGNOSIS — Z20822 Contact with and (suspected) exposure to covid-19: Secondary | ICD-10-CM | POA: Diagnosis not present

## 2019-10-30 DIAGNOSIS — N184 Chronic kidney disease, stage 4 (severe): Secondary | ICD-10-CM | POA: Diagnosis not present

## 2019-10-30 DIAGNOSIS — Z20822 Contact with and (suspected) exposure to covid-19: Secondary | ICD-10-CM | POA: Diagnosis not present

## 2019-10-30 DIAGNOSIS — D638 Anemia in other chronic diseases classified elsewhere: Secondary | ICD-10-CM | POA: Diagnosis not present

## 2019-11-03 DIAGNOSIS — Z20822 Contact with and (suspected) exposure to covid-19: Secondary | ICD-10-CM | POA: Diagnosis not present

## 2019-11-06 DIAGNOSIS — Z20822 Contact with and (suspected) exposure to covid-19: Secondary | ICD-10-CM | POA: Diagnosis not present

## 2019-11-10 DIAGNOSIS — U071 COVID-19: Secondary | ICD-10-CM | POA: Diagnosis not present

## 2019-11-13 DIAGNOSIS — Z20822 Contact with and (suspected) exposure to covid-19: Secondary | ICD-10-CM | POA: Diagnosis not present

## 2019-11-18 DIAGNOSIS — U071 COVID-19: Secondary | ICD-10-CM | POA: Diagnosis not present

## 2019-11-20 DIAGNOSIS — U071 COVID-19: Secondary | ICD-10-CM | POA: Diagnosis not present

## 2019-11-21 DIAGNOSIS — R278 Other lack of coordination: Secondary | ICD-10-CM | POA: Diagnosis not present

## 2019-11-21 DIAGNOSIS — R1310 Dysphagia, unspecified: Secondary | ICD-10-CM | POA: Diagnosis not present

## 2019-11-21 DIAGNOSIS — I10 Essential (primary) hypertension: Secondary | ICD-10-CM | POA: Diagnosis not present

## 2019-11-21 DIAGNOSIS — R1311 Dysphagia, oral phase: Secondary | ICD-10-CM | POA: Diagnosis not present

## 2019-11-21 DIAGNOSIS — I509 Heart failure, unspecified: Secondary | ICD-10-CM | POA: Diagnosis not present

## 2019-11-21 DIAGNOSIS — M24572 Contracture, left ankle: Secondary | ICD-10-CM | POA: Diagnosis not present

## 2019-11-21 DIAGNOSIS — I2510 Atherosclerotic heart disease of native coronary artery without angina pectoris: Secondary | ICD-10-CM | POA: Diagnosis not present

## 2019-11-21 DIAGNOSIS — M6281 Muscle weakness (generalized): Secondary | ICD-10-CM | POA: Diagnosis not present

## 2019-11-21 DIAGNOSIS — R293 Abnormal posture: Secondary | ICD-10-CM | POA: Diagnosis not present

## 2019-11-26 DIAGNOSIS — R1311 Dysphagia, oral phase: Secondary | ICD-10-CM | POA: Diagnosis not present

## 2019-11-26 DIAGNOSIS — R278 Other lack of coordination: Secondary | ICD-10-CM | POA: Diagnosis not present

## 2019-11-26 DIAGNOSIS — M24572 Contracture, left ankle: Secondary | ICD-10-CM | POA: Diagnosis not present

## 2019-11-26 DIAGNOSIS — R1310 Dysphagia, unspecified: Secondary | ICD-10-CM | POA: Diagnosis not present

## 2019-11-26 DIAGNOSIS — M6281 Muscle weakness (generalized): Secondary | ICD-10-CM | POA: Diagnosis not present

## 2019-11-26 DIAGNOSIS — I2510 Atherosclerotic heart disease of native coronary artery without angina pectoris: Secondary | ICD-10-CM | POA: Diagnosis not present

## 2019-11-26 DIAGNOSIS — I509 Heart failure, unspecified: Secondary | ICD-10-CM | POA: Diagnosis not present

## 2019-11-26 DIAGNOSIS — R293 Abnormal posture: Secondary | ICD-10-CM | POA: Diagnosis not present

## 2019-11-26 DIAGNOSIS — I10 Essential (primary) hypertension: Secondary | ICD-10-CM | POA: Diagnosis not present

## 2019-11-27 DIAGNOSIS — R1310 Dysphagia, unspecified: Secondary | ICD-10-CM | POA: Diagnosis not present

## 2019-11-27 DIAGNOSIS — I10 Essential (primary) hypertension: Secondary | ICD-10-CM | POA: Diagnosis not present

## 2019-11-27 DIAGNOSIS — R293 Abnormal posture: Secondary | ICD-10-CM | POA: Diagnosis not present

## 2019-11-27 DIAGNOSIS — I2510 Atherosclerotic heart disease of native coronary artery without angina pectoris: Secondary | ICD-10-CM | POA: Diagnosis not present

## 2019-11-27 DIAGNOSIS — U071 COVID-19: Secondary | ICD-10-CM | POA: Diagnosis not present

## 2019-11-27 DIAGNOSIS — M6281 Muscle weakness (generalized): Secondary | ICD-10-CM | POA: Diagnosis not present

## 2019-11-27 DIAGNOSIS — R278 Other lack of coordination: Secondary | ICD-10-CM | POA: Diagnosis not present

## 2019-11-27 DIAGNOSIS — R1311 Dysphagia, oral phase: Secondary | ICD-10-CM | POA: Diagnosis not present

## 2019-11-27 DIAGNOSIS — M24572 Contracture, left ankle: Secondary | ICD-10-CM | POA: Diagnosis not present

## 2019-11-27 DIAGNOSIS — I509 Heart failure, unspecified: Secondary | ICD-10-CM | POA: Diagnosis not present

## 2019-11-28 DIAGNOSIS — I2510 Atherosclerotic heart disease of native coronary artery without angina pectoris: Secondary | ICD-10-CM | POA: Diagnosis not present

## 2019-11-28 DIAGNOSIS — M24572 Contracture, left ankle: Secondary | ICD-10-CM | POA: Diagnosis not present

## 2019-11-28 DIAGNOSIS — R278 Other lack of coordination: Secondary | ICD-10-CM | POA: Diagnosis not present

## 2019-11-28 DIAGNOSIS — R1310 Dysphagia, unspecified: Secondary | ICD-10-CM | POA: Diagnosis not present

## 2019-11-28 DIAGNOSIS — I10 Essential (primary) hypertension: Secondary | ICD-10-CM | POA: Diagnosis not present

## 2019-11-28 DIAGNOSIS — R1311 Dysphagia, oral phase: Secondary | ICD-10-CM | POA: Diagnosis not present

## 2019-11-28 DIAGNOSIS — M6281 Muscle weakness (generalized): Secondary | ICD-10-CM | POA: Diagnosis not present

## 2019-11-28 DIAGNOSIS — R293 Abnormal posture: Secondary | ICD-10-CM | POA: Diagnosis not present

## 2019-11-28 DIAGNOSIS — I509 Heart failure, unspecified: Secondary | ICD-10-CM | POA: Diagnosis not present

## 2019-12-01 DIAGNOSIS — U071 COVID-19: Secondary | ICD-10-CM | POA: Diagnosis not present

## 2019-12-01 DIAGNOSIS — R278 Other lack of coordination: Secondary | ICD-10-CM | POA: Diagnosis not present

## 2019-12-01 DIAGNOSIS — I2510 Atherosclerotic heart disease of native coronary artery without angina pectoris: Secondary | ICD-10-CM | POA: Diagnosis not present

## 2019-12-01 DIAGNOSIS — R1310 Dysphagia, unspecified: Secondary | ICD-10-CM | POA: Diagnosis not present

## 2019-12-01 DIAGNOSIS — I509 Heart failure, unspecified: Secondary | ICD-10-CM | POA: Diagnosis not present

## 2019-12-01 DIAGNOSIS — I10 Essential (primary) hypertension: Secondary | ICD-10-CM | POA: Diagnosis not present

## 2019-12-01 DIAGNOSIS — R293 Abnormal posture: Secondary | ICD-10-CM | POA: Diagnosis not present

## 2019-12-01 DIAGNOSIS — M6281 Muscle weakness (generalized): Secondary | ICD-10-CM | POA: Diagnosis not present

## 2019-12-01 DIAGNOSIS — M24572 Contracture, left ankle: Secondary | ICD-10-CM | POA: Diagnosis not present

## 2019-12-01 DIAGNOSIS — R1311 Dysphagia, oral phase: Secondary | ICD-10-CM | POA: Diagnosis not present

## 2019-12-02 DIAGNOSIS — R1310 Dysphagia, unspecified: Secondary | ICD-10-CM | POA: Diagnosis not present

## 2019-12-02 DIAGNOSIS — R1311 Dysphagia, oral phase: Secondary | ICD-10-CM | POA: Diagnosis not present

## 2019-12-02 DIAGNOSIS — M24572 Contracture, left ankle: Secondary | ICD-10-CM | POA: Diagnosis not present

## 2019-12-02 DIAGNOSIS — I509 Heart failure, unspecified: Secondary | ICD-10-CM | POA: Diagnosis not present

## 2019-12-02 DIAGNOSIS — I2510 Atherosclerotic heart disease of native coronary artery without angina pectoris: Secondary | ICD-10-CM | POA: Diagnosis not present

## 2019-12-02 DIAGNOSIS — I10 Essential (primary) hypertension: Secondary | ICD-10-CM | POA: Diagnosis not present

## 2019-12-02 DIAGNOSIS — M6281 Muscle weakness (generalized): Secondary | ICD-10-CM | POA: Diagnosis not present

## 2019-12-02 DIAGNOSIS — R278 Other lack of coordination: Secondary | ICD-10-CM | POA: Diagnosis not present

## 2019-12-02 DIAGNOSIS — R293 Abnormal posture: Secondary | ICD-10-CM | POA: Diagnosis not present

## 2019-12-03 DIAGNOSIS — R1311 Dysphagia, oral phase: Secondary | ICD-10-CM | POA: Diagnosis not present

## 2019-12-03 DIAGNOSIS — R293 Abnormal posture: Secondary | ICD-10-CM | POA: Diagnosis not present

## 2019-12-03 DIAGNOSIS — I509 Heart failure, unspecified: Secondary | ICD-10-CM | POA: Diagnosis not present

## 2019-12-03 DIAGNOSIS — I10 Essential (primary) hypertension: Secondary | ICD-10-CM | POA: Diagnosis not present

## 2019-12-03 DIAGNOSIS — R1310 Dysphagia, unspecified: Secondary | ICD-10-CM | POA: Diagnosis not present

## 2019-12-03 DIAGNOSIS — M6281 Muscle weakness (generalized): Secondary | ICD-10-CM | POA: Diagnosis not present

## 2019-12-03 DIAGNOSIS — R278 Other lack of coordination: Secondary | ICD-10-CM | POA: Diagnosis not present

## 2019-12-03 DIAGNOSIS — M24572 Contracture, left ankle: Secondary | ICD-10-CM | POA: Diagnosis not present

## 2019-12-03 DIAGNOSIS — I2510 Atherosclerotic heart disease of native coronary artery without angina pectoris: Secondary | ICD-10-CM | POA: Diagnosis not present

## 2019-12-04 DIAGNOSIS — M6281 Muscle weakness (generalized): Secondary | ICD-10-CM | POA: Diagnosis not present

## 2019-12-04 DIAGNOSIS — R293 Abnormal posture: Secondary | ICD-10-CM | POA: Diagnosis not present

## 2019-12-04 DIAGNOSIS — F0390 Unspecified dementia without behavioral disturbance: Secondary | ICD-10-CM | POA: Diagnosis not present

## 2019-12-04 DIAGNOSIS — I509 Heart failure, unspecified: Secondary | ICD-10-CM | POA: Diagnosis not present

## 2019-12-04 DIAGNOSIS — D539 Nutritional anemia, unspecified: Secondary | ICD-10-CM | POA: Diagnosis not present

## 2019-12-04 DIAGNOSIS — R278 Other lack of coordination: Secondary | ICD-10-CM | POA: Diagnosis not present

## 2019-12-04 DIAGNOSIS — I10 Essential (primary) hypertension: Secondary | ICD-10-CM | POA: Diagnosis not present

## 2019-12-04 DIAGNOSIS — M24572 Contracture, left ankle: Secondary | ICD-10-CM | POA: Diagnosis not present

## 2019-12-04 DIAGNOSIS — R1310 Dysphagia, unspecified: Secondary | ICD-10-CM | POA: Diagnosis not present

## 2019-12-04 DIAGNOSIS — I2510 Atherosclerotic heart disease of native coronary artery without angina pectoris: Secondary | ICD-10-CM | POA: Diagnosis not present

## 2019-12-04 DIAGNOSIS — Z20822 Contact with and (suspected) exposure to covid-19: Secondary | ICD-10-CM | POA: Diagnosis not present

## 2019-12-04 DIAGNOSIS — R1311 Dysphagia, oral phase: Secondary | ICD-10-CM | POA: Diagnosis not present

## 2019-12-05 DIAGNOSIS — R1311 Dysphagia, oral phase: Secondary | ICD-10-CM | POA: Diagnosis not present

## 2019-12-05 DIAGNOSIS — I2510 Atherosclerotic heart disease of native coronary artery without angina pectoris: Secondary | ICD-10-CM | POA: Diagnosis not present

## 2019-12-05 DIAGNOSIS — I509 Heart failure, unspecified: Secondary | ICD-10-CM | POA: Diagnosis not present

## 2019-12-05 DIAGNOSIS — R293 Abnormal posture: Secondary | ICD-10-CM | POA: Diagnosis not present

## 2019-12-05 DIAGNOSIS — I10 Essential (primary) hypertension: Secondary | ICD-10-CM | POA: Diagnosis not present

## 2019-12-05 DIAGNOSIS — R1310 Dysphagia, unspecified: Secondary | ICD-10-CM | POA: Diagnosis not present

## 2019-12-05 DIAGNOSIS — M24572 Contracture, left ankle: Secondary | ICD-10-CM | POA: Diagnosis not present

## 2019-12-05 DIAGNOSIS — M6281 Muscle weakness (generalized): Secondary | ICD-10-CM | POA: Diagnosis not present

## 2019-12-05 DIAGNOSIS — R278 Other lack of coordination: Secondary | ICD-10-CM | POA: Diagnosis not present

## 2019-12-08 DIAGNOSIS — M24572 Contracture, left ankle: Secondary | ICD-10-CM | POA: Diagnosis not present

## 2019-12-08 DIAGNOSIS — I10 Essential (primary) hypertension: Secondary | ICD-10-CM | POA: Diagnosis not present

## 2019-12-08 DIAGNOSIS — R293 Abnormal posture: Secondary | ICD-10-CM | POA: Diagnosis not present

## 2019-12-08 DIAGNOSIS — R1310 Dysphagia, unspecified: Secondary | ICD-10-CM | POA: Diagnosis not present

## 2019-12-08 DIAGNOSIS — I509 Heart failure, unspecified: Secondary | ICD-10-CM | POA: Diagnosis not present

## 2019-12-08 DIAGNOSIS — R278 Other lack of coordination: Secondary | ICD-10-CM | POA: Diagnosis not present

## 2019-12-08 DIAGNOSIS — R1311 Dysphagia, oral phase: Secondary | ICD-10-CM | POA: Diagnosis not present

## 2019-12-08 DIAGNOSIS — I2510 Atherosclerotic heart disease of native coronary artery without angina pectoris: Secondary | ICD-10-CM | POA: Diagnosis not present

## 2019-12-08 DIAGNOSIS — M6281 Muscle weakness (generalized): Secondary | ICD-10-CM | POA: Diagnosis not present

## 2019-12-19 DIAGNOSIS — R058 Other specified cough: Secondary | ICD-10-CM | POA: Diagnosis not present

## 2019-12-19 DIAGNOSIS — R1319 Other dysphagia: Secondary | ICD-10-CM | POA: Diagnosis not present

## 2019-12-19 DIAGNOSIS — R059 Cough, unspecified: Secondary | ICD-10-CM | POA: Diagnosis not present

## 2019-12-19 DIAGNOSIS — R0689 Other abnormalities of breathing: Secondary | ICD-10-CM | POA: Diagnosis not present

## 2020-01-28 DIAGNOSIS — I2510 Atherosclerotic heart disease of native coronary artery without angina pectoris: Secondary | ICD-10-CM | POA: Diagnosis not present

## 2020-01-28 DIAGNOSIS — N390 Urinary tract infection, site not specified: Secondary | ICD-10-CM | POA: Diagnosis not present

## 2020-01-28 DIAGNOSIS — Z0189 Encounter for other specified special examinations: Secondary | ICD-10-CM | POA: Diagnosis not present

## 2020-01-28 DIAGNOSIS — D649 Anemia, unspecified: Secondary | ICD-10-CM | POA: Diagnosis not present

## 2020-01-28 DIAGNOSIS — D519 Vitamin B12 deficiency anemia, unspecified: Secondary | ICD-10-CM | POA: Diagnosis not present

## 2020-01-28 DIAGNOSIS — R7309 Other abnormal glucose: Secondary | ICD-10-CM | POA: Diagnosis not present

## 2020-01-28 DIAGNOSIS — I5189 Other ill-defined heart diseases: Secondary | ICD-10-CM | POA: Diagnosis not present

## 2020-01-28 DIAGNOSIS — I129 Hypertensive chronic kidney disease with stage 1 through stage 4 chronic kidney disease, or unspecified chronic kidney disease: Secondary | ICD-10-CM | POA: Diagnosis not present

## 2020-01-29 DIAGNOSIS — R532 Functional quadriplegia: Secondary | ICD-10-CM | POA: Diagnosis not present

## 2020-01-29 DIAGNOSIS — D638 Anemia in other chronic diseases classified elsewhere: Secondary | ICD-10-CM | POA: Diagnosis not present

## 2020-01-29 DIAGNOSIS — F0280 Dementia in other diseases classified elsewhere without behavioral disturbance: Secondary | ICD-10-CM | POA: Diagnosis not present

## 2020-02-26 DIAGNOSIS — D649 Anemia, unspecified: Secondary | ICD-10-CM | POA: Diagnosis not present

## 2020-03-10 DIAGNOSIS — I509 Heart failure, unspecified: Secondary | ICD-10-CM | POA: Diagnosis not present

## 2020-03-10 DIAGNOSIS — U071 COVID-19: Secondary | ICD-10-CM | POA: Diagnosis not present

## 2020-03-10 DIAGNOSIS — I10 Essential (primary) hypertension: Secondary | ICD-10-CM | POA: Diagnosis not present

## 2020-03-18 DIAGNOSIS — F0280 Dementia in other diseases classified elsewhere without behavioral disturbance: Secondary | ICD-10-CM | POA: Diagnosis not present

## 2020-03-18 DIAGNOSIS — U071 COVID-19: Secondary | ICD-10-CM | POA: Diagnosis not present

## 2020-03-18 DIAGNOSIS — I5189 Other ill-defined heart diseases: Secondary | ICD-10-CM | POA: Diagnosis not present

## 2020-03-18 DIAGNOSIS — I10 Essential (primary) hypertension: Secondary | ICD-10-CM | POA: Diagnosis not present

## 2020-03-19 DIAGNOSIS — D649 Anemia, unspecified: Secondary | ICD-10-CM | POA: Diagnosis not present

## 2020-03-22 DIAGNOSIS — U071 COVID-19: Secondary | ICD-10-CM | POA: Diagnosis not present

## 2020-03-22 DIAGNOSIS — E870 Hyperosmolality and hypernatremia: Secondary | ICD-10-CM | POA: Diagnosis not present

## 2020-03-22 DIAGNOSIS — E860 Dehydration: Secondary | ICD-10-CM | POA: Diagnosis not present

## 2020-03-22 DIAGNOSIS — N184 Chronic kidney disease, stage 4 (severe): Secondary | ICD-10-CM | POA: Diagnosis not present

## 2020-03-23 DIAGNOSIS — D649 Anemia, unspecified: Secondary | ICD-10-CM | POA: Diagnosis not present

## 2020-03-26 DIAGNOSIS — I2510 Atherosclerotic heart disease of native coronary artery without angina pectoris: Secondary | ICD-10-CM | POA: Diagnosis not present

## 2020-03-26 DIAGNOSIS — L989 Disorder of the skin and subcutaneous tissue, unspecified: Secondary | ICD-10-CM | POA: Diagnosis not present

## 2020-03-26 DIAGNOSIS — I5189 Other ill-defined heart diseases: Secondary | ICD-10-CM | POA: Diagnosis not present

## 2020-03-26 DIAGNOSIS — N184 Chronic kidney disease, stage 4 (severe): Secondary | ICD-10-CM | POA: Diagnosis not present

## 2020-03-26 DIAGNOSIS — R1319 Other dysphagia: Secondary | ICD-10-CM | POA: Diagnosis not present

## 2020-03-26 DIAGNOSIS — D539 Nutritional anemia, unspecified: Secondary | ICD-10-CM | POA: Diagnosis not present

## 2020-03-26 DIAGNOSIS — F0280 Dementia in other diseases classified elsewhere without behavioral disturbance: Secondary | ICD-10-CM | POA: Diagnosis not present

## 2020-03-26 DIAGNOSIS — L84 Corns and callosities: Secondary | ICD-10-CM | POA: Diagnosis not present

## 2020-03-26 DIAGNOSIS — M216X2 Other acquired deformities of left foot: Secondary | ICD-10-CM | POA: Diagnosis not present

## 2020-03-29 DIAGNOSIS — R41841 Cognitive communication deficit: Secondary | ICD-10-CM | POA: Diagnosis not present

## 2020-03-29 DIAGNOSIS — I10 Essential (primary) hypertension: Secondary | ICD-10-CM | POA: Diagnosis not present

## 2020-03-29 DIAGNOSIS — R1311 Dysphagia, oral phase: Secondary | ICD-10-CM | POA: Diagnosis not present

## 2020-03-29 DIAGNOSIS — I2510 Atherosclerotic heart disease of native coronary artery without angina pectoris: Secondary | ICD-10-CM | POA: Diagnosis not present

## 2020-03-29 DIAGNOSIS — R293 Abnormal posture: Secondary | ICD-10-CM | POA: Diagnosis not present

## 2020-03-29 DIAGNOSIS — R1310 Dysphagia, unspecified: Secondary | ICD-10-CM | POA: Diagnosis not present

## 2020-03-29 DIAGNOSIS — R278 Other lack of coordination: Secondary | ICD-10-CM | POA: Diagnosis not present

## 2020-03-29 DIAGNOSIS — M6281 Muscle weakness (generalized): Secondary | ICD-10-CM | POA: Diagnosis not present

## 2020-03-29 DIAGNOSIS — I509 Heart failure, unspecified: Secondary | ICD-10-CM | POA: Diagnosis not present

## 2020-03-30 DIAGNOSIS — I2510 Atherosclerotic heart disease of native coronary artery without angina pectoris: Secondary | ICD-10-CM | POA: Diagnosis not present

## 2020-03-30 DIAGNOSIS — R1311 Dysphagia, oral phase: Secondary | ICD-10-CM | POA: Diagnosis not present

## 2020-03-30 DIAGNOSIS — I509 Heart failure, unspecified: Secondary | ICD-10-CM | POA: Diagnosis not present

## 2020-03-30 DIAGNOSIS — I10 Essential (primary) hypertension: Secondary | ICD-10-CM | POA: Diagnosis not present

## 2020-03-30 DIAGNOSIS — R278 Other lack of coordination: Secondary | ICD-10-CM | POA: Diagnosis not present

## 2020-03-30 DIAGNOSIS — R41841 Cognitive communication deficit: Secondary | ICD-10-CM | POA: Diagnosis not present

## 2020-03-30 DIAGNOSIS — M6281 Muscle weakness (generalized): Secondary | ICD-10-CM | POA: Diagnosis not present

## 2020-03-30 DIAGNOSIS — R293 Abnormal posture: Secondary | ICD-10-CM | POA: Diagnosis not present

## 2020-03-30 DIAGNOSIS — R1310 Dysphagia, unspecified: Secondary | ICD-10-CM | POA: Diagnosis not present

## 2020-03-31 DIAGNOSIS — M6281 Muscle weakness (generalized): Secondary | ICD-10-CM | POA: Diagnosis not present

## 2020-03-31 DIAGNOSIS — R278 Other lack of coordination: Secondary | ICD-10-CM | POA: Diagnosis not present

## 2020-03-31 DIAGNOSIS — R41841 Cognitive communication deficit: Secondary | ICD-10-CM | POA: Diagnosis not present

## 2020-03-31 DIAGNOSIS — R1311 Dysphagia, oral phase: Secondary | ICD-10-CM | POA: Diagnosis not present

## 2020-03-31 DIAGNOSIS — R1310 Dysphagia, unspecified: Secondary | ICD-10-CM | POA: Diagnosis not present

## 2020-03-31 DIAGNOSIS — I10 Essential (primary) hypertension: Secondary | ICD-10-CM | POA: Diagnosis not present

## 2020-03-31 DIAGNOSIS — I509 Heart failure, unspecified: Secondary | ICD-10-CM | POA: Diagnosis not present

## 2020-03-31 DIAGNOSIS — R293 Abnormal posture: Secondary | ICD-10-CM | POA: Diagnosis not present

## 2020-03-31 DIAGNOSIS — I2510 Atherosclerotic heart disease of native coronary artery without angina pectoris: Secondary | ICD-10-CM | POA: Diagnosis not present

## 2020-04-01 DIAGNOSIS — I10 Essential (primary) hypertension: Secondary | ICD-10-CM | POA: Diagnosis not present

## 2020-04-01 DIAGNOSIS — R278 Other lack of coordination: Secondary | ICD-10-CM | POA: Diagnosis not present

## 2020-04-01 DIAGNOSIS — I509 Heart failure, unspecified: Secondary | ICD-10-CM | POA: Diagnosis not present

## 2020-04-01 DIAGNOSIS — M6281 Muscle weakness (generalized): Secondary | ICD-10-CM | POA: Diagnosis not present

## 2020-04-01 DIAGNOSIS — R1310 Dysphagia, unspecified: Secondary | ICD-10-CM | POA: Diagnosis not present

## 2020-04-01 DIAGNOSIS — R293 Abnormal posture: Secondary | ICD-10-CM | POA: Diagnosis not present

## 2020-04-01 DIAGNOSIS — R1311 Dysphagia, oral phase: Secondary | ICD-10-CM | POA: Diagnosis not present

## 2020-04-01 DIAGNOSIS — I2510 Atherosclerotic heart disease of native coronary artery without angina pectoris: Secondary | ICD-10-CM | POA: Diagnosis not present

## 2020-04-01 DIAGNOSIS — R41841 Cognitive communication deficit: Secondary | ICD-10-CM | POA: Diagnosis not present

## 2020-04-02 DIAGNOSIS — R293 Abnormal posture: Secondary | ICD-10-CM | POA: Diagnosis not present

## 2020-04-02 DIAGNOSIS — I509 Heart failure, unspecified: Secondary | ICD-10-CM | POA: Diagnosis not present

## 2020-04-02 DIAGNOSIS — I10 Essential (primary) hypertension: Secondary | ICD-10-CM | POA: Diagnosis not present

## 2020-04-02 DIAGNOSIS — M6281 Muscle weakness (generalized): Secondary | ICD-10-CM | POA: Diagnosis not present

## 2020-04-02 DIAGNOSIS — R278 Other lack of coordination: Secondary | ICD-10-CM | POA: Diagnosis not present

## 2020-04-02 DIAGNOSIS — I2510 Atherosclerotic heart disease of native coronary artery without angina pectoris: Secondary | ICD-10-CM | POA: Diagnosis not present

## 2020-04-02 DIAGNOSIS — R41841 Cognitive communication deficit: Secondary | ICD-10-CM | POA: Diagnosis not present

## 2020-04-02 DIAGNOSIS — R1311 Dysphagia, oral phase: Secondary | ICD-10-CM | POA: Diagnosis not present

## 2020-04-02 DIAGNOSIS — R1310 Dysphagia, unspecified: Secondary | ICD-10-CM | POA: Diagnosis not present

## 2020-04-05 DIAGNOSIS — I509 Heart failure, unspecified: Secondary | ICD-10-CM | POA: Diagnosis not present

## 2020-04-05 DIAGNOSIS — R293 Abnormal posture: Secondary | ICD-10-CM | POA: Diagnosis not present

## 2020-04-05 DIAGNOSIS — R278 Other lack of coordination: Secondary | ICD-10-CM | POA: Diagnosis not present

## 2020-04-05 DIAGNOSIS — I2510 Atherosclerotic heart disease of native coronary artery without angina pectoris: Secondary | ICD-10-CM | POA: Diagnosis not present

## 2020-04-05 DIAGNOSIS — R1310 Dysphagia, unspecified: Secondary | ICD-10-CM | POA: Diagnosis not present

## 2020-04-05 DIAGNOSIS — R41841 Cognitive communication deficit: Secondary | ICD-10-CM | POA: Diagnosis not present

## 2020-04-05 DIAGNOSIS — M6281 Muscle weakness (generalized): Secondary | ICD-10-CM | POA: Diagnosis not present

## 2020-04-05 DIAGNOSIS — R1311 Dysphagia, oral phase: Secondary | ICD-10-CM | POA: Diagnosis not present

## 2020-04-05 DIAGNOSIS — I10 Essential (primary) hypertension: Secondary | ICD-10-CM | POA: Diagnosis not present

## 2020-04-06 DIAGNOSIS — R1311 Dysphagia, oral phase: Secondary | ICD-10-CM | POA: Diagnosis not present

## 2020-04-06 DIAGNOSIS — R1310 Dysphagia, unspecified: Secondary | ICD-10-CM | POA: Diagnosis not present

## 2020-04-06 DIAGNOSIS — I509 Heart failure, unspecified: Secondary | ICD-10-CM | POA: Diagnosis not present

## 2020-04-06 DIAGNOSIS — I10 Essential (primary) hypertension: Secondary | ICD-10-CM | POA: Diagnosis not present

## 2020-04-06 DIAGNOSIS — R41841 Cognitive communication deficit: Secondary | ICD-10-CM | POA: Diagnosis not present

## 2020-04-06 DIAGNOSIS — M6281 Muscle weakness (generalized): Secondary | ICD-10-CM | POA: Diagnosis not present

## 2020-04-06 DIAGNOSIS — I2510 Atherosclerotic heart disease of native coronary artery without angina pectoris: Secondary | ICD-10-CM | POA: Diagnosis not present

## 2020-04-06 DIAGNOSIS — R278 Other lack of coordination: Secondary | ICD-10-CM | POA: Diagnosis not present

## 2020-04-06 DIAGNOSIS — R293 Abnormal posture: Secondary | ICD-10-CM | POA: Diagnosis not present

## 2020-04-07 DIAGNOSIS — M6281 Muscle weakness (generalized): Secondary | ICD-10-CM | POA: Diagnosis not present

## 2020-04-07 DIAGNOSIS — R41841 Cognitive communication deficit: Secondary | ICD-10-CM | POA: Diagnosis not present

## 2020-04-07 DIAGNOSIS — R293 Abnormal posture: Secondary | ICD-10-CM | POA: Diagnosis not present

## 2020-04-07 DIAGNOSIS — R1310 Dysphagia, unspecified: Secondary | ICD-10-CM | POA: Diagnosis not present

## 2020-04-07 DIAGNOSIS — I509 Heart failure, unspecified: Secondary | ICD-10-CM | POA: Diagnosis not present

## 2020-04-07 DIAGNOSIS — I2510 Atherosclerotic heart disease of native coronary artery without angina pectoris: Secondary | ICD-10-CM | POA: Diagnosis not present

## 2020-04-07 DIAGNOSIS — R278 Other lack of coordination: Secondary | ICD-10-CM | POA: Diagnosis not present

## 2020-04-07 DIAGNOSIS — I10 Essential (primary) hypertension: Secondary | ICD-10-CM | POA: Diagnosis not present

## 2020-04-07 DIAGNOSIS — R1311 Dysphagia, oral phase: Secondary | ICD-10-CM | POA: Diagnosis not present

## 2020-04-08 DIAGNOSIS — I10 Essential (primary) hypertension: Secondary | ICD-10-CM | POA: Diagnosis not present

## 2020-04-08 DIAGNOSIS — M6281 Muscle weakness (generalized): Secondary | ICD-10-CM | POA: Diagnosis not present

## 2020-04-08 DIAGNOSIS — R1311 Dysphagia, oral phase: Secondary | ICD-10-CM | POA: Diagnosis not present

## 2020-04-08 DIAGNOSIS — I509 Heart failure, unspecified: Secondary | ICD-10-CM | POA: Diagnosis not present

## 2020-04-08 DIAGNOSIS — R278 Other lack of coordination: Secondary | ICD-10-CM | POA: Diagnosis not present

## 2020-04-08 DIAGNOSIS — R41841 Cognitive communication deficit: Secondary | ICD-10-CM | POA: Diagnosis not present

## 2020-04-08 DIAGNOSIS — R293 Abnormal posture: Secondary | ICD-10-CM | POA: Diagnosis not present

## 2020-04-08 DIAGNOSIS — R1310 Dysphagia, unspecified: Secondary | ICD-10-CM | POA: Diagnosis not present

## 2020-04-08 DIAGNOSIS — I2510 Atherosclerotic heart disease of native coronary artery without angina pectoris: Secondary | ICD-10-CM | POA: Diagnosis not present

## 2020-04-09 DIAGNOSIS — M6281 Muscle weakness (generalized): Secondary | ICD-10-CM | POA: Diagnosis not present

## 2020-04-09 DIAGNOSIS — I10 Essential (primary) hypertension: Secondary | ICD-10-CM | POA: Diagnosis not present

## 2020-04-09 DIAGNOSIS — R278 Other lack of coordination: Secondary | ICD-10-CM | POA: Diagnosis not present

## 2020-04-09 DIAGNOSIS — R1311 Dysphagia, oral phase: Secondary | ICD-10-CM | POA: Diagnosis not present

## 2020-04-09 DIAGNOSIS — R1310 Dysphagia, unspecified: Secondary | ICD-10-CM | POA: Diagnosis not present

## 2020-04-09 DIAGNOSIS — I509 Heart failure, unspecified: Secondary | ICD-10-CM | POA: Diagnosis not present

## 2020-04-09 DIAGNOSIS — R293 Abnormal posture: Secondary | ICD-10-CM | POA: Diagnosis not present

## 2020-04-09 DIAGNOSIS — R41841 Cognitive communication deficit: Secondary | ICD-10-CM | POA: Diagnosis not present

## 2020-04-09 DIAGNOSIS — I2510 Atherosclerotic heart disease of native coronary artery without angina pectoris: Secondary | ICD-10-CM | POA: Diagnosis not present

## 2020-04-12 DIAGNOSIS — R1311 Dysphagia, oral phase: Secondary | ICD-10-CM | POA: Diagnosis not present

## 2020-04-12 DIAGNOSIS — I2510 Atherosclerotic heart disease of native coronary artery without angina pectoris: Secondary | ICD-10-CM | POA: Diagnosis not present

## 2020-04-12 DIAGNOSIS — R293 Abnormal posture: Secondary | ICD-10-CM | POA: Diagnosis not present

## 2020-04-12 DIAGNOSIS — R1310 Dysphagia, unspecified: Secondary | ICD-10-CM | POA: Diagnosis not present

## 2020-04-12 DIAGNOSIS — R41841 Cognitive communication deficit: Secondary | ICD-10-CM | POA: Diagnosis not present

## 2020-04-12 DIAGNOSIS — R278 Other lack of coordination: Secondary | ICD-10-CM | POA: Diagnosis not present

## 2020-04-12 DIAGNOSIS — M6281 Muscle weakness (generalized): Secondary | ICD-10-CM | POA: Diagnosis not present

## 2020-04-12 DIAGNOSIS — I10 Essential (primary) hypertension: Secondary | ICD-10-CM | POA: Diagnosis not present

## 2020-04-12 DIAGNOSIS — I509 Heart failure, unspecified: Secondary | ICD-10-CM | POA: Diagnosis not present

## 2020-04-13 DIAGNOSIS — I10 Essential (primary) hypertension: Secondary | ICD-10-CM | POA: Diagnosis not present

## 2020-04-13 DIAGNOSIS — M6281 Muscle weakness (generalized): Secondary | ICD-10-CM | POA: Diagnosis not present

## 2020-04-13 DIAGNOSIS — I2510 Atherosclerotic heart disease of native coronary artery without angina pectoris: Secondary | ICD-10-CM | POA: Diagnosis not present

## 2020-04-13 DIAGNOSIS — R1310 Dysphagia, unspecified: Secondary | ICD-10-CM | POA: Diagnosis not present

## 2020-04-13 DIAGNOSIS — R1311 Dysphagia, oral phase: Secondary | ICD-10-CM | POA: Diagnosis not present

## 2020-04-13 DIAGNOSIS — R1312 Dysphagia, oropharyngeal phase: Secondary | ICD-10-CM | POA: Diagnosis not present

## 2020-04-13 DIAGNOSIS — R41841 Cognitive communication deficit: Secondary | ICD-10-CM | POA: Diagnosis not present

## 2020-04-13 DIAGNOSIS — R278 Other lack of coordination: Secondary | ICD-10-CM | POA: Diagnosis not present

## 2020-04-13 DIAGNOSIS — I509 Heart failure, unspecified: Secondary | ICD-10-CM | POA: Diagnosis not present

## 2020-04-14 DIAGNOSIS — I10 Essential (primary) hypertension: Secondary | ICD-10-CM | POA: Diagnosis not present

## 2020-04-14 DIAGNOSIS — I2510 Atherosclerotic heart disease of native coronary artery without angina pectoris: Secondary | ICD-10-CM | POA: Diagnosis not present

## 2020-04-14 DIAGNOSIS — R1311 Dysphagia, oral phase: Secondary | ICD-10-CM | POA: Diagnosis not present

## 2020-04-14 DIAGNOSIS — R1312 Dysphagia, oropharyngeal phase: Secondary | ICD-10-CM | POA: Diagnosis not present

## 2020-04-14 DIAGNOSIS — R278 Other lack of coordination: Secondary | ICD-10-CM | POA: Diagnosis not present

## 2020-04-14 DIAGNOSIS — R1310 Dysphagia, unspecified: Secondary | ICD-10-CM | POA: Diagnosis not present

## 2020-04-14 DIAGNOSIS — I509 Heart failure, unspecified: Secondary | ICD-10-CM | POA: Diagnosis not present

## 2020-04-14 DIAGNOSIS — R41841 Cognitive communication deficit: Secondary | ICD-10-CM | POA: Diagnosis not present

## 2020-04-14 DIAGNOSIS — M6281 Muscle weakness (generalized): Secondary | ICD-10-CM | POA: Diagnosis not present

## 2020-05-27 DIAGNOSIS — D539 Nutritional anemia, unspecified: Secondary | ICD-10-CM | POA: Diagnosis not present

## 2020-05-27 DIAGNOSIS — F0280 Dementia in other diseases classified elsewhere without behavioral disturbance: Secondary | ICD-10-CM | POA: Diagnosis not present

## 2020-05-27 DIAGNOSIS — N184 Chronic kidney disease, stage 4 (severe): Secondary | ICD-10-CM | POA: Diagnosis not present

## 2020-05-27 DIAGNOSIS — I10 Essential (primary) hypertension: Secondary | ICD-10-CM | POA: Diagnosis not present

## 2020-05-27 DIAGNOSIS — I2510 Atherosclerotic heart disease of native coronary artery without angina pectoris: Secondary | ICD-10-CM | POA: Diagnosis not present

## 2020-05-27 DIAGNOSIS — I5189 Other ill-defined heart diseases: Secondary | ICD-10-CM | POA: Diagnosis not present

## 2020-06-14 DIAGNOSIS — R1312 Dysphagia, oropharyngeal phase: Secondary | ICD-10-CM | POA: Diagnosis not present

## 2020-06-14 DIAGNOSIS — R1310 Dysphagia, unspecified: Secondary | ICD-10-CM | POA: Diagnosis not present

## 2020-06-14 DIAGNOSIS — I509 Heart failure, unspecified: Secondary | ICD-10-CM | POA: Diagnosis not present

## 2020-06-14 DIAGNOSIS — R278 Other lack of coordination: Secondary | ICD-10-CM | POA: Diagnosis not present

## 2020-06-14 DIAGNOSIS — I2510 Atherosclerotic heart disease of native coronary artery without angina pectoris: Secondary | ICD-10-CM | POA: Diagnosis not present

## 2020-06-14 DIAGNOSIS — I10 Essential (primary) hypertension: Secondary | ICD-10-CM | POA: Diagnosis not present

## 2020-06-14 DIAGNOSIS — R293 Abnormal posture: Secondary | ICD-10-CM | POA: Diagnosis not present

## 2020-06-14 DIAGNOSIS — M6281 Muscle weakness (generalized): Secondary | ICD-10-CM | POA: Diagnosis not present

## 2020-06-14 DIAGNOSIS — R41841 Cognitive communication deficit: Secondary | ICD-10-CM | POA: Diagnosis not present

## 2020-06-16 DIAGNOSIS — I509 Heart failure, unspecified: Secondary | ICD-10-CM | POA: Diagnosis not present

## 2020-06-16 DIAGNOSIS — R41841 Cognitive communication deficit: Secondary | ICD-10-CM | POA: Diagnosis not present

## 2020-06-16 DIAGNOSIS — M6281 Muscle weakness (generalized): Secondary | ICD-10-CM | POA: Diagnosis not present

## 2020-06-16 DIAGNOSIS — I10 Essential (primary) hypertension: Secondary | ICD-10-CM | POA: Diagnosis not present

## 2020-06-16 DIAGNOSIS — R1312 Dysphagia, oropharyngeal phase: Secondary | ICD-10-CM | POA: Diagnosis not present

## 2020-06-16 DIAGNOSIS — R293 Abnormal posture: Secondary | ICD-10-CM | POA: Diagnosis not present

## 2020-06-16 DIAGNOSIS — R1310 Dysphagia, unspecified: Secondary | ICD-10-CM | POA: Diagnosis not present

## 2020-06-16 DIAGNOSIS — R278 Other lack of coordination: Secondary | ICD-10-CM | POA: Diagnosis not present

## 2020-06-16 DIAGNOSIS — I2510 Atherosclerotic heart disease of native coronary artery without angina pectoris: Secondary | ICD-10-CM | POA: Diagnosis not present

## 2020-06-18 DIAGNOSIS — R278 Other lack of coordination: Secondary | ICD-10-CM | POA: Diagnosis not present

## 2020-06-18 DIAGNOSIS — R1310 Dysphagia, unspecified: Secondary | ICD-10-CM | POA: Diagnosis not present

## 2020-06-18 DIAGNOSIS — R293 Abnormal posture: Secondary | ICD-10-CM | POA: Diagnosis not present

## 2020-06-18 DIAGNOSIS — I509 Heart failure, unspecified: Secondary | ICD-10-CM | POA: Diagnosis not present

## 2020-06-18 DIAGNOSIS — M6281 Muscle weakness (generalized): Secondary | ICD-10-CM | POA: Diagnosis not present

## 2020-06-18 DIAGNOSIS — I10 Essential (primary) hypertension: Secondary | ICD-10-CM | POA: Diagnosis not present

## 2020-06-18 DIAGNOSIS — R1312 Dysphagia, oropharyngeal phase: Secondary | ICD-10-CM | POA: Diagnosis not present

## 2020-06-18 DIAGNOSIS — I2510 Atherosclerotic heart disease of native coronary artery without angina pectoris: Secondary | ICD-10-CM | POA: Diagnosis not present

## 2020-06-18 DIAGNOSIS — R41841 Cognitive communication deficit: Secondary | ICD-10-CM | POA: Diagnosis not present

## 2020-06-21 DIAGNOSIS — R293 Abnormal posture: Secondary | ICD-10-CM | POA: Diagnosis not present

## 2020-06-21 DIAGNOSIS — R1312 Dysphagia, oropharyngeal phase: Secondary | ICD-10-CM | POA: Diagnosis not present

## 2020-06-21 DIAGNOSIS — R278 Other lack of coordination: Secondary | ICD-10-CM | POA: Diagnosis not present

## 2020-06-21 DIAGNOSIS — I2510 Atherosclerotic heart disease of native coronary artery without angina pectoris: Secondary | ICD-10-CM | POA: Diagnosis not present

## 2020-06-21 DIAGNOSIS — I10 Essential (primary) hypertension: Secondary | ICD-10-CM | POA: Diagnosis not present

## 2020-06-21 DIAGNOSIS — R41841 Cognitive communication deficit: Secondary | ICD-10-CM | POA: Diagnosis not present

## 2020-06-21 DIAGNOSIS — I509 Heart failure, unspecified: Secondary | ICD-10-CM | POA: Diagnosis not present

## 2020-06-21 DIAGNOSIS — M6281 Muscle weakness (generalized): Secondary | ICD-10-CM | POA: Diagnosis not present

## 2020-06-21 DIAGNOSIS — R1310 Dysphagia, unspecified: Secondary | ICD-10-CM | POA: Diagnosis not present

## 2020-06-22 DIAGNOSIS — R1310 Dysphagia, unspecified: Secondary | ICD-10-CM | POA: Diagnosis not present

## 2020-06-22 DIAGNOSIS — R41841 Cognitive communication deficit: Secondary | ICD-10-CM | POA: Diagnosis not present

## 2020-06-22 DIAGNOSIS — R1312 Dysphagia, oropharyngeal phase: Secondary | ICD-10-CM | POA: Diagnosis not present

## 2020-06-22 DIAGNOSIS — R278 Other lack of coordination: Secondary | ICD-10-CM | POA: Diagnosis not present

## 2020-06-22 DIAGNOSIS — I2510 Atherosclerotic heart disease of native coronary artery without angina pectoris: Secondary | ICD-10-CM | POA: Diagnosis not present

## 2020-06-22 DIAGNOSIS — I10 Essential (primary) hypertension: Secondary | ICD-10-CM | POA: Diagnosis not present

## 2020-06-22 DIAGNOSIS — M6281 Muscle weakness (generalized): Secondary | ICD-10-CM | POA: Diagnosis not present

## 2020-06-22 DIAGNOSIS — R293 Abnormal posture: Secondary | ICD-10-CM | POA: Diagnosis not present

## 2020-06-22 DIAGNOSIS — I509 Heart failure, unspecified: Secondary | ICD-10-CM | POA: Diagnosis not present

## 2020-06-23 DIAGNOSIS — M6281 Muscle weakness (generalized): Secondary | ICD-10-CM | POA: Diagnosis not present

## 2020-06-23 DIAGNOSIS — I10 Essential (primary) hypertension: Secondary | ICD-10-CM | POA: Diagnosis not present

## 2020-06-23 DIAGNOSIS — R41841 Cognitive communication deficit: Secondary | ICD-10-CM | POA: Diagnosis not present

## 2020-06-23 DIAGNOSIS — R278 Other lack of coordination: Secondary | ICD-10-CM | POA: Diagnosis not present

## 2020-06-23 DIAGNOSIS — I2510 Atherosclerotic heart disease of native coronary artery without angina pectoris: Secondary | ICD-10-CM | POA: Diagnosis not present

## 2020-06-23 DIAGNOSIS — S31819D Unspecified open wound of right buttock, subsequent encounter: Secondary | ICD-10-CM | POA: Diagnosis not present

## 2020-06-23 DIAGNOSIS — R1310 Dysphagia, unspecified: Secondary | ICD-10-CM | POA: Diagnosis not present

## 2020-06-23 DIAGNOSIS — R1312 Dysphagia, oropharyngeal phase: Secondary | ICD-10-CM | POA: Diagnosis not present

## 2020-06-23 DIAGNOSIS — R293 Abnormal posture: Secondary | ICD-10-CM | POA: Diagnosis not present

## 2020-06-23 DIAGNOSIS — I509 Heart failure, unspecified: Secondary | ICD-10-CM | POA: Diagnosis not present

## 2020-06-24 DIAGNOSIS — R278 Other lack of coordination: Secondary | ICD-10-CM | POA: Diagnosis not present

## 2020-06-24 DIAGNOSIS — R1310 Dysphagia, unspecified: Secondary | ICD-10-CM | POA: Diagnosis not present

## 2020-06-24 DIAGNOSIS — R1312 Dysphagia, oropharyngeal phase: Secondary | ICD-10-CM | POA: Diagnosis not present

## 2020-06-24 DIAGNOSIS — R41841 Cognitive communication deficit: Secondary | ICD-10-CM | POA: Diagnosis not present

## 2020-06-24 DIAGNOSIS — I2510 Atherosclerotic heart disease of native coronary artery without angina pectoris: Secondary | ICD-10-CM | POA: Diagnosis not present

## 2020-06-24 DIAGNOSIS — I509 Heart failure, unspecified: Secondary | ICD-10-CM | POA: Diagnosis not present

## 2020-06-24 DIAGNOSIS — I10 Essential (primary) hypertension: Secondary | ICD-10-CM | POA: Diagnosis not present

## 2020-06-24 DIAGNOSIS — M6281 Muscle weakness (generalized): Secondary | ICD-10-CM | POA: Diagnosis not present

## 2020-06-24 DIAGNOSIS — R293 Abnormal posture: Secondary | ICD-10-CM | POA: Diagnosis not present

## 2020-06-25 DIAGNOSIS — M6281 Muscle weakness (generalized): Secondary | ICD-10-CM | POA: Diagnosis not present

## 2020-06-25 DIAGNOSIS — R278 Other lack of coordination: Secondary | ICD-10-CM | POA: Diagnosis not present

## 2020-06-25 DIAGNOSIS — I509 Heart failure, unspecified: Secondary | ICD-10-CM | POA: Diagnosis not present

## 2020-06-25 DIAGNOSIS — I739 Peripheral vascular disease, unspecified: Secondary | ICD-10-CM | POA: Diagnosis not present

## 2020-06-25 DIAGNOSIS — L97509 Non-pressure chronic ulcer of other part of unspecified foot with unspecified severity: Secondary | ICD-10-CM | POA: Diagnosis not present

## 2020-06-25 DIAGNOSIS — R41841 Cognitive communication deficit: Secondary | ICD-10-CM | POA: Diagnosis not present

## 2020-06-25 DIAGNOSIS — R1312 Dysphagia, oropharyngeal phase: Secondary | ICD-10-CM | POA: Diagnosis not present

## 2020-06-25 DIAGNOSIS — R1310 Dysphagia, unspecified: Secondary | ICD-10-CM | POA: Diagnosis not present

## 2020-06-25 DIAGNOSIS — R634 Abnormal weight loss: Secondary | ICD-10-CM | POA: Diagnosis not present

## 2020-06-25 DIAGNOSIS — I2510 Atherosclerotic heart disease of native coronary artery without angina pectoris: Secondary | ICD-10-CM | POA: Diagnosis not present

## 2020-06-25 DIAGNOSIS — I10 Essential (primary) hypertension: Secondary | ICD-10-CM | POA: Diagnosis not present

## 2020-06-25 DIAGNOSIS — R293 Abnormal posture: Secondary | ICD-10-CM | POA: Diagnosis not present

## 2020-06-29 DIAGNOSIS — I70234 Atherosclerosis of native arteries of right leg with ulceration of heel and midfoot: Secondary | ICD-10-CM | POA: Diagnosis not present

## 2020-07-01 DIAGNOSIS — Z23 Encounter for immunization: Secondary | ICD-10-CM | POA: Diagnosis not present

## 2020-07-06 DIAGNOSIS — I70234 Atherosclerosis of native arteries of right leg with ulceration of heel and midfoot: Secondary | ICD-10-CM | POA: Diagnosis not present

## 2020-07-06 DIAGNOSIS — I70235 Atherosclerosis of native arteries of right leg with ulceration of other part of foot: Secondary | ICD-10-CM | POA: Diagnosis not present

## 2020-07-13 DIAGNOSIS — I70234 Atherosclerosis of native arteries of right leg with ulceration of heel and midfoot: Secondary | ICD-10-CM | POA: Diagnosis not present

## 2020-07-20 DIAGNOSIS — I70234 Atherosclerosis of native arteries of right leg with ulceration of heel and midfoot: Secondary | ICD-10-CM | POA: Diagnosis not present

## 2020-07-23 DIAGNOSIS — I70235 Atherosclerosis of native arteries of right leg with ulceration of other part of foot: Secondary | ICD-10-CM | POA: Diagnosis not present

## 2020-07-27 DIAGNOSIS — R0989 Other specified symptoms and signs involving the circulatory and respiratory systems: Secondary | ICD-10-CM | POA: Diagnosis not present

## 2020-07-27 DIAGNOSIS — R1319 Other dysphagia: Secondary | ICD-10-CM | POA: Diagnosis not present

## 2020-07-27 DIAGNOSIS — N184 Chronic kidney disease, stage 4 (severe): Secondary | ICD-10-CM | POA: Diagnosis not present

## 2020-07-27 DIAGNOSIS — I2510 Atherosclerotic heart disease of native coronary artery without angina pectoris: Secondary | ICD-10-CM | POA: Diagnosis not present

## 2020-07-27 DIAGNOSIS — I70234 Atherosclerosis of native arteries of right leg with ulceration of heel and midfoot: Secondary | ICD-10-CM | POA: Diagnosis not present

## 2020-07-27 DIAGNOSIS — M1049 Other secondary gout, multiple sites: Secondary | ICD-10-CM | POA: Diagnosis not present

## 2020-07-27 DIAGNOSIS — D538 Other specified nutritional anemias: Secondary | ICD-10-CM | POA: Diagnosis not present

## 2020-07-27 DIAGNOSIS — I5189 Other ill-defined heart diseases: Secondary | ICD-10-CM | POA: Diagnosis not present

## 2020-07-27 DIAGNOSIS — I7389 Other specified peripheral vascular diseases: Secondary | ICD-10-CM | POA: Diagnosis not present

## 2020-07-27 DIAGNOSIS — R634 Abnormal weight loss: Secondary | ICD-10-CM | POA: Diagnosis not present

## 2020-07-28 DIAGNOSIS — E559 Vitamin D deficiency, unspecified: Secondary | ICD-10-CM | POA: Diagnosis not present

## 2020-07-28 DIAGNOSIS — D519 Vitamin B12 deficiency anemia, unspecified: Secondary | ICD-10-CM | POA: Diagnosis not present

## 2020-07-28 DIAGNOSIS — D649 Anemia, unspecified: Secondary | ICD-10-CM | POA: Diagnosis not present

## 2020-07-29 DIAGNOSIS — F0280 Dementia in other diseases classified elsewhere without behavioral disturbance: Secondary | ICD-10-CM | POA: Diagnosis not present

## 2020-07-29 DIAGNOSIS — Z0189 Encounter for other specified special examinations: Secondary | ICD-10-CM | POA: Diagnosis not present

## 2020-07-29 DIAGNOSIS — I10 Essential (primary) hypertension: Secondary | ICD-10-CM | POA: Diagnosis not present

## 2020-07-29 DIAGNOSIS — E568 Deficiency of other vitamins: Secondary | ICD-10-CM | POA: Diagnosis not present

## 2020-07-29 DIAGNOSIS — D6489 Other specified anemias: Secondary | ICD-10-CM | POA: Diagnosis not present

## 2020-08-03 DIAGNOSIS — I70234 Atherosclerosis of native arteries of right leg with ulceration of heel and midfoot: Secondary | ICD-10-CM | POA: Diagnosis not present

## 2020-08-12 DIAGNOSIS — D649 Anemia, unspecified: Secondary | ICD-10-CM | POA: Diagnosis not present

## 2020-08-18 DIAGNOSIS — D6489 Other specified anemias: Secondary | ICD-10-CM | POA: Diagnosis not present

## 2020-08-18 DIAGNOSIS — R1311 Dysphagia, oral phase: Secondary | ICD-10-CM | POA: Diagnosis not present

## 2020-08-18 DIAGNOSIS — R634 Abnormal weight loss: Secondary | ICD-10-CM | POA: Diagnosis not present

## 2020-08-18 DIAGNOSIS — E538 Deficiency of other specified B group vitamins: Secondary | ICD-10-CM | POA: Diagnosis not present

## 2020-08-21 DIAGNOSIS — K921 Melena: Secondary | ICD-10-CM | POA: Diagnosis not present

## 2020-08-23 DIAGNOSIS — D649 Anemia, unspecified: Secondary | ICD-10-CM | POA: Diagnosis not present

## 2020-09-11 DIAGNOSIS — R1312 Dysphagia, oropharyngeal phase: Secondary | ICD-10-CM | POA: Diagnosis not present

## 2020-09-11 DIAGNOSIS — R1310 Dysphagia, unspecified: Secondary | ICD-10-CM | POA: Diagnosis not present

## 2020-09-11 DIAGNOSIS — I2510 Atherosclerotic heart disease of native coronary artery without angina pectoris: Secondary | ICD-10-CM | POA: Diagnosis not present

## 2020-09-11 DIAGNOSIS — R41841 Cognitive communication deficit: Secondary | ICD-10-CM | POA: Diagnosis not present

## 2020-09-11 DIAGNOSIS — R278 Other lack of coordination: Secondary | ICD-10-CM | POA: Diagnosis not present

## 2020-09-11 DIAGNOSIS — M6281 Muscle weakness (generalized): Secondary | ICD-10-CM | POA: Diagnosis not present

## 2020-09-11 DIAGNOSIS — I509 Heart failure, unspecified: Secondary | ICD-10-CM | POA: Diagnosis not present

## 2020-09-11 DIAGNOSIS — I10 Essential (primary) hypertension: Secondary | ICD-10-CM | POA: Diagnosis not present

## 2020-09-11 DIAGNOSIS — R293 Abnormal posture: Secondary | ICD-10-CM | POA: Diagnosis not present

## 2020-09-13 DIAGNOSIS — I2510 Atherosclerotic heart disease of native coronary artery without angina pectoris: Secondary | ICD-10-CM | POA: Diagnosis not present

## 2020-09-13 DIAGNOSIS — R278 Other lack of coordination: Secondary | ICD-10-CM | POA: Diagnosis not present

## 2020-09-13 DIAGNOSIS — R293 Abnormal posture: Secondary | ICD-10-CM | POA: Diagnosis not present

## 2020-09-13 DIAGNOSIS — R1310 Dysphagia, unspecified: Secondary | ICD-10-CM | POA: Diagnosis not present

## 2020-09-13 DIAGNOSIS — M6281 Muscle weakness (generalized): Secondary | ICD-10-CM | POA: Diagnosis not present

## 2020-09-13 DIAGNOSIS — I509 Heart failure, unspecified: Secondary | ICD-10-CM | POA: Diagnosis not present

## 2020-09-13 DIAGNOSIS — R1312 Dysphagia, oropharyngeal phase: Secondary | ICD-10-CM | POA: Diagnosis not present

## 2020-09-13 DIAGNOSIS — R41841 Cognitive communication deficit: Secondary | ICD-10-CM | POA: Diagnosis not present

## 2020-09-13 DIAGNOSIS — I10 Essential (primary) hypertension: Secondary | ICD-10-CM | POA: Diagnosis not present

## 2020-09-14 DIAGNOSIS — R1312 Dysphagia, oropharyngeal phase: Secondary | ICD-10-CM | POA: Diagnosis not present

## 2020-09-14 DIAGNOSIS — I509 Heart failure, unspecified: Secondary | ICD-10-CM | POA: Diagnosis not present

## 2020-09-14 DIAGNOSIS — M6281 Muscle weakness (generalized): Secondary | ICD-10-CM | POA: Diagnosis not present

## 2020-09-14 DIAGNOSIS — I2510 Atherosclerotic heart disease of native coronary artery without angina pectoris: Secondary | ICD-10-CM | POA: Diagnosis not present

## 2020-09-14 DIAGNOSIS — I10 Essential (primary) hypertension: Secondary | ICD-10-CM | POA: Diagnosis not present

## 2020-09-14 DIAGNOSIS — R1310 Dysphagia, unspecified: Secondary | ICD-10-CM | POA: Diagnosis not present

## 2020-09-14 DIAGNOSIS — R41841 Cognitive communication deficit: Secondary | ICD-10-CM | POA: Diagnosis not present

## 2020-09-14 DIAGNOSIS — R278 Other lack of coordination: Secondary | ICD-10-CM | POA: Diagnosis not present

## 2020-09-14 DIAGNOSIS — R293 Abnormal posture: Secondary | ICD-10-CM | POA: Diagnosis not present

## 2020-09-15 DIAGNOSIS — R1310 Dysphagia, unspecified: Secondary | ICD-10-CM | POA: Diagnosis not present

## 2020-09-15 DIAGNOSIS — M6281 Muscle weakness (generalized): Secondary | ICD-10-CM | POA: Diagnosis not present

## 2020-09-15 DIAGNOSIS — R1312 Dysphagia, oropharyngeal phase: Secondary | ICD-10-CM | POA: Diagnosis not present

## 2020-09-15 DIAGNOSIS — R278 Other lack of coordination: Secondary | ICD-10-CM | POA: Diagnosis not present

## 2020-09-15 DIAGNOSIS — I509 Heart failure, unspecified: Secondary | ICD-10-CM | POA: Diagnosis not present

## 2020-09-15 DIAGNOSIS — I2510 Atherosclerotic heart disease of native coronary artery without angina pectoris: Secondary | ICD-10-CM | POA: Diagnosis not present

## 2020-09-15 DIAGNOSIS — R293 Abnormal posture: Secondary | ICD-10-CM | POA: Diagnosis not present

## 2020-09-15 DIAGNOSIS — R41841 Cognitive communication deficit: Secondary | ICD-10-CM | POA: Diagnosis not present

## 2020-09-15 DIAGNOSIS — I10 Essential (primary) hypertension: Secondary | ICD-10-CM | POA: Diagnosis not present

## 2020-09-16 DIAGNOSIS — K921 Melena: Secondary | ICD-10-CM | POA: Diagnosis not present

## 2020-09-16 DIAGNOSIS — I10 Essential (primary) hypertension: Secondary | ICD-10-CM | POA: Diagnosis not present

## 2020-09-16 DIAGNOSIS — I509 Heart failure, unspecified: Secondary | ICD-10-CM | POA: Diagnosis not present

## 2020-09-16 DIAGNOSIS — Z7189 Other specified counseling: Secondary | ICD-10-CM | POA: Diagnosis not present

## 2020-09-16 DIAGNOSIS — R278 Other lack of coordination: Secondary | ICD-10-CM | POA: Diagnosis not present

## 2020-09-16 DIAGNOSIS — F0390 Unspecified dementia without behavioral disturbance: Secondary | ICD-10-CM | POA: Diagnosis not present

## 2020-09-16 DIAGNOSIS — R41841 Cognitive communication deficit: Secondary | ICD-10-CM | POA: Diagnosis not present

## 2020-09-16 DIAGNOSIS — R293 Abnormal posture: Secondary | ICD-10-CM | POA: Diagnosis not present

## 2020-09-16 DIAGNOSIS — R1312 Dysphagia, oropharyngeal phase: Secondary | ICD-10-CM | POA: Diagnosis not present

## 2020-09-16 DIAGNOSIS — M6281 Muscle weakness (generalized): Secondary | ICD-10-CM | POA: Diagnosis not present

## 2020-09-16 DIAGNOSIS — I2510 Atherosclerotic heart disease of native coronary artery without angina pectoris: Secondary | ICD-10-CM | POA: Diagnosis not present

## 2020-09-16 DIAGNOSIS — R1310 Dysphagia, unspecified: Secondary | ICD-10-CM | POA: Diagnosis not present

## 2020-09-17 DIAGNOSIS — I10 Essential (primary) hypertension: Secondary | ICD-10-CM | POA: Diagnosis not present

## 2020-09-17 DIAGNOSIS — E785 Hyperlipidemia, unspecified: Secondary | ICD-10-CM | POA: Diagnosis not present

## 2020-09-17 DIAGNOSIS — I509 Heart failure, unspecified: Secondary | ICD-10-CM | POA: Diagnosis not present

## 2020-09-17 DIAGNOSIS — R41841 Cognitive communication deficit: Secondary | ICD-10-CM | POA: Diagnosis not present

## 2020-09-17 DIAGNOSIS — M109 Gout, unspecified: Secondary | ICD-10-CM | POA: Diagnosis not present

## 2020-09-17 DIAGNOSIS — R1310 Dysphagia, unspecified: Secondary | ICD-10-CM | POA: Diagnosis not present

## 2020-09-17 DIAGNOSIS — M6281 Muscle weakness (generalized): Secondary | ICD-10-CM | POA: Diagnosis not present

## 2020-09-17 DIAGNOSIS — R1312 Dysphagia, oropharyngeal phase: Secondary | ICD-10-CM | POA: Diagnosis not present

## 2020-09-17 DIAGNOSIS — D519 Vitamin B12 deficiency anemia, unspecified: Secondary | ICD-10-CM | POA: Diagnosis not present

## 2020-09-17 DIAGNOSIS — I2510 Atherosclerotic heart disease of native coronary artery without angina pectoris: Secondary | ICD-10-CM | POA: Diagnosis not present

## 2020-09-17 DIAGNOSIS — R293 Abnormal posture: Secondary | ICD-10-CM | POA: Diagnosis not present

## 2020-09-17 DIAGNOSIS — R278 Other lack of coordination: Secondary | ICD-10-CM | POA: Diagnosis not present

## 2020-09-19 DIAGNOSIS — I10 Essential (primary) hypertension: Secondary | ICD-10-CM | POA: Diagnosis not present

## 2020-09-19 DIAGNOSIS — R278 Other lack of coordination: Secondary | ICD-10-CM | POA: Diagnosis not present

## 2020-09-19 DIAGNOSIS — I509 Heart failure, unspecified: Secondary | ICD-10-CM | POA: Diagnosis not present

## 2020-09-19 DIAGNOSIS — I2510 Atherosclerotic heart disease of native coronary artery without angina pectoris: Secondary | ICD-10-CM | POA: Diagnosis not present

## 2020-09-19 DIAGNOSIS — M6281 Muscle weakness (generalized): Secondary | ICD-10-CM | POA: Diagnosis not present

## 2020-09-19 DIAGNOSIS — R293 Abnormal posture: Secondary | ICD-10-CM | POA: Diagnosis not present

## 2020-09-19 DIAGNOSIS — R41841 Cognitive communication deficit: Secondary | ICD-10-CM | POA: Diagnosis not present

## 2020-09-19 DIAGNOSIS — R1310 Dysphagia, unspecified: Secondary | ICD-10-CM | POA: Diagnosis not present

## 2020-09-19 DIAGNOSIS — R1312 Dysphagia, oropharyngeal phase: Secondary | ICD-10-CM | POA: Diagnosis not present

## 2020-09-20 DIAGNOSIS — I10 Essential (primary) hypertension: Secondary | ICD-10-CM | POA: Diagnosis not present

## 2020-09-20 DIAGNOSIS — R41841 Cognitive communication deficit: Secondary | ICD-10-CM | POA: Diagnosis not present

## 2020-09-20 DIAGNOSIS — R1312 Dysphagia, oropharyngeal phase: Secondary | ICD-10-CM | POA: Diagnosis not present

## 2020-09-20 DIAGNOSIS — R278 Other lack of coordination: Secondary | ICD-10-CM | POA: Diagnosis not present

## 2020-09-20 DIAGNOSIS — R1310 Dysphagia, unspecified: Secondary | ICD-10-CM | POA: Diagnosis not present

## 2020-09-20 DIAGNOSIS — I509 Heart failure, unspecified: Secondary | ICD-10-CM | POA: Diagnosis not present

## 2020-09-20 DIAGNOSIS — R293 Abnormal posture: Secondary | ICD-10-CM | POA: Diagnosis not present

## 2020-09-20 DIAGNOSIS — M6281 Muscle weakness (generalized): Secondary | ICD-10-CM | POA: Diagnosis not present

## 2020-09-20 DIAGNOSIS — I2510 Atherosclerotic heart disease of native coronary artery without angina pectoris: Secondary | ICD-10-CM | POA: Diagnosis not present

## 2020-09-21 DIAGNOSIS — I2510 Atherosclerotic heart disease of native coronary artery without angina pectoris: Secondary | ICD-10-CM | POA: Diagnosis not present

## 2020-09-21 DIAGNOSIS — M6281 Muscle weakness (generalized): Secondary | ICD-10-CM | POA: Diagnosis not present

## 2020-09-21 DIAGNOSIS — R1312 Dysphagia, oropharyngeal phase: Secondary | ICD-10-CM | POA: Diagnosis not present

## 2020-09-21 DIAGNOSIS — R41841 Cognitive communication deficit: Secondary | ICD-10-CM | POA: Diagnosis not present

## 2020-09-21 DIAGNOSIS — I509 Heart failure, unspecified: Secondary | ICD-10-CM | POA: Diagnosis not present

## 2020-09-21 DIAGNOSIS — I10 Essential (primary) hypertension: Secondary | ICD-10-CM | POA: Diagnosis not present

## 2020-09-21 DIAGNOSIS — N184 Chronic kidney disease, stage 4 (severe): Secondary | ICD-10-CM | POA: Diagnosis not present

## 2020-09-21 DIAGNOSIS — F0390 Unspecified dementia without behavioral disturbance: Secondary | ICD-10-CM | POA: Diagnosis not present

## 2020-09-21 DIAGNOSIS — D649 Anemia, unspecified: Secondary | ICD-10-CM | POA: Diagnosis not present

## 2020-09-21 DIAGNOSIS — R1310 Dysphagia, unspecified: Secondary | ICD-10-CM | POA: Diagnosis not present

## 2020-09-21 DIAGNOSIS — R293 Abnormal posture: Secondary | ICD-10-CM | POA: Diagnosis not present

## 2020-09-21 DIAGNOSIS — R278 Other lack of coordination: Secondary | ICD-10-CM | POA: Diagnosis not present

## 2020-09-22 DIAGNOSIS — I10 Essential (primary) hypertension: Secondary | ICD-10-CM | POA: Diagnosis not present

## 2020-09-22 DIAGNOSIS — M6281 Muscle weakness (generalized): Secondary | ICD-10-CM | POA: Diagnosis not present

## 2020-09-22 DIAGNOSIS — R1310 Dysphagia, unspecified: Secondary | ICD-10-CM | POA: Diagnosis not present

## 2020-09-22 DIAGNOSIS — R293 Abnormal posture: Secondary | ICD-10-CM | POA: Diagnosis not present

## 2020-09-22 DIAGNOSIS — R278 Other lack of coordination: Secondary | ICD-10-CM | POA: Diagnosis not present

## 2020-09-22 DIAGNOSIS — I509 Heart failure, unspecified: Secondary | ICD-10-CM | POA: Diagnosis not present

## 2020-09-22 DIAGNOSIS — R1312 Dysphagia, oropharyngeal phase: Secondary | ICD-10-CM | POA: Diagnosis not present

## 2020-09-22 DIAGNOSIS — R41841 Cognitive communication deficit: Secondary | ICD-10-CM | POA: Diagnosis not present

## 2020-09-22 DIAGNOSIS — I2510 Atherosclerotic heart disease of native coronary artery without angina pectoris: Secondary | ICD-10-CM | POA: Diagnosis not present

## 2020-09-23 DIAGNOSIS — I509 Heart failure, unspecified: Secondary | ICD-10-CM | POA: Diagnosis not present

## 2020-09-23 DIAGNOSIS — R41841 Cognitive communication deficit: Secondary | ICD-10-CM | POA: Diagnosis not present

## 2020-09-23 DIAGNOSIS — M6281 Muscle weakness (generalized): Secondary | ICD-10-CM | POA: Diagnosis not present

## 2020-09-23 DIAGNOSIS — I2510 Atherosclerotic heart disease of native coronary artery without angina pectoris: Secondary | ICD-10-CM | POA: Diagnosis not present

## 2020-09-23 DIAGNOSIS — D649 Anemia, unspecified: Secondary | ICD-10-CM | POA: Diagnosis not present

## 2020-09-23 DIAGNOSIS — R278 Other lack of coordination: Secondary | ICD-10-CM | POA: Diagnosis not present

## 2020-09-23 DIAGNOSIS — R1312 Dysphagia, oropharyngeal phase: Secondary | ICD-10-CM | POA: Diagnosis not present

## 2020-09-23 DIAGNOSIS — R293 Abnormal posture: Secondary | ICD-10-CM | POA: Diagnosis not present

## 2020-09-23 DIAGNOSIS — I10 Essential (primary) hypertension: Secondary | ICD-10-CM | POA: Diagnosis not present

## 2020-09-23 DIAGNOSIS — R1310 Dysphagia, unspecified: Secondary | ICD-10-CM | POA: Diagnosis not present

## 2020-09-23 DIAGNOSIS — F0390 Unspecified dementia without behavioral disturbance: Secondary | ICD-10-CM | POA: Diagnosis not present

## 2020-09-24 DIAGNOSIS — R278 Other lack of coordination: Secondary | ICD-10-CM | POA: Diagnosis not present

## 2020-09-24 DIAGNOSIS — R41841 Cognitive communication deficit: Secondary | ICD-10-CM | POA: Diagnosis not present

## 2020-09-24 DIAGNOSIS — R1310 Dysphagia, unspecified: Secondary | ICD-10-CM | POA: Diagnosis not present

## 2020-09-24 DIAGNOSIS — R1312 Dysphagia, oropharyngeal phase: Secondary | ICD-10-CM | POA: Diagnosis not present

## 2020-09-24 DIAGNOSIS — I10 Essential (primary) hypertension: Secondary | ICD-10-CM | POA: Diagnosis not present

## 2020-09-24 DIAGNOSIS — R293 Abnormal posture: Secondary | ICD-10-CM | POA: Diagnosis not present

## 2020-09-24 DIAGNOSIS — I2510 Atherosclerotic heart disease of native coronary artery without angina pectoris: Secondary | ICD-10-CM | POA: Diagnosis not present

## 2020-09-24 DIAGNOSIS — M6281 Muscle weakness (generalized): Secondary | ICD-10-CM | POA: Diagnosis not present

## 2020-09-24 DIAGNOSIS — I509 Heart failure, unspecified: Secondary | ICD-10-CM | POA: Diagnosis not present

## 2020-09-26 DIAGNOSIS — R1312 Dysphagia, oropharyngeal phase: Secondary | ICD-10-CM | POA: Diagnosis not present

## 2020-09-26 DIAGNOSIS — R293 Abnormal posture: Secondary | ICD-10-CM | POA: Diagnosis not present

## 2020-09-26 DIAGNOSIS — R1310 Dysphagia, unspecified: Secondary | ICD-10-CM | POA: Diagnosis not present

## 2020-09-26 DIAGNOSIS — I10 Essential (primary) hypertension: Secondary | ICD-10-CM | POA: Diagnosis not present

## 2020-09-26 DIAGNOSIS — M6281 Muscle weakness (generalized): Secondary | ICD-10-CM | POA: Diagnosis not present

## 2020-09-26 DIAGNOSIS — R278 Other lack of coordination: Secondary | ICD-10-CM | POA: Diagnosis not present

## 2020-09-26 DIAGNOSIS — I509 Heart failure, unspecified: Secondary | ICD-10-CM | POA: Diagnosis not present

## 2020-09-26 DIAGNOSIS — R41841 Cognitive communication deficit: Secondary | ICD-10-CM | POA: Diagnosis not present

## 2020-09-26 DIAGNOSIS — I2510 Atherosclerotic heart disease of native coronary artery without angina pectoris: Secondary | ICD-10-CM | POA: Diagnosis not present

## 2020-10-11 DIAGNOSIS — I509 Heart failure, unspecified: Secondary | ICD-10-CM | POA: Diagnosis not present

## 2020-10-11 DIAGNOSIS — I2510 Atherosclerotic heart disease of native coronary artery without angina pectoris: Secondary | ICD-10-CM | POA: Diagnosis not present

## 2020-10-11 DIAGNOSIS — I10 Essential (primary) hypertension: Secondary | ICD-10-CM | POA: Diagnosis not present

## 2020-10-11 DIAGNOSIS — D519 Vitamin B12 deficiency anemia, unspecified: Secondary | ICD-10-CM | POA: Diagnosis not present

## 2020-10-12 DIAGNOSIS — K921 Melena: Secondary | ICD-10-CM | POA: Diagnosis not present

## 2020-10-12 DIAGNOSIS — E860 Dehydration: Secondary | ICD-10-CM | POA: Diagnosis not present

## 2020-10-12 DIAGNOSIS — E538 Deficiency of other specified B group vitamins: Secondary | ICD-10-CM | POA: Diagnosis not present

## 2020-10-12 DIAGNOSIS — Z1331 Encounter for screening for depression: Secondary | ICD-10-CM | POA: Diagnosis not present

## 2020-10-12 DIAGNOSIS — N184 Chronic kidney disease, stage 4 (severe): Secondary | ICD-10-CM | POA: Diagnosis not present

## 2020-10-12 DIAGNOSIS — D638 Anemia in other chronic diseases classified elsewhere: Secondary | ICD-10-CM | POA: Diagnosis not present

## 2020-11-08 ENCOUNTER — Encounter: Payer: Self-pay | Admitting: Internal Medicine

## 2020-11-08 ENCOUNTER — Ambulatory Visit (INDEPENDENT_AMBULATORY_CARE_PROVIDER_SITE_OTHER): Payer: Medicare PPO | Admitting: Internal Medicine

## 2020-11-08 VITALS — BP 110/60 | HR 68

## 2020-11-08 DIAGNOSIS — D509 Iron deficiency anemia, unspecified: Secondary | ICD-10-CM | POA: Diagnosis not present

## 2020-11-08 DIAGNOSIS — Z8711 Personal history of peptic ulcer disease: Secondary | ICD-10-CM

## 2020-11-08 DIAGNOSIS — K219 Gastro-esophageal reflux disease without esophagitis: Secondary | ICD-10-CM | POA: Diagnosis not present

## 2020-11-08 DIAGNOSIS — R195 Other fecal abnormalities: Secondary | ICD-10-CM

## 2020-11-08 MED ORDER — PANTOPRAZOLE SODIUM 40 MG PO TBEC: 40.0000 mg | DELAYED_RELEASE_TABLET | Freq: Two times a day (BID) | ORAL | 3 refills | Status: DC

## 2020-11-08 NOTE — Progress Notes (Signed)
HISTORY OF PRESENT ILLNESS:  Beth Gutierrez is a 87 y.o. female, skilled nursing home resident, with multiple significant medical problems including advanced dementia who is sent by her skilled nursing facility regarding Hemoccult positive stool.  The patient has not been seen in this office since March 2015 when she was evaluated for diarrhea.  She does have a history of duodenal ulcer with Helicobacter pylori (which was treated).  Last upper endoscopy 2015 revealed small duodenal ulcer and erosive gastritis.  Last complete colonoscopy 2005 with small polyp removed.  She is accompanied today by her daughter and granddaughter.  They report 40 pound weight loss over the course of the year.  The patient is bedbound and wheelchair-bound.  She has chronic foot ulcers.  There have been no problems with vomiting, abdominal pain, or diarrhea.  She does use oxygen therapy.  Review of outside records states that the patient has chronic anemia ranging between 7.3 and 8.9 hemoglobin.  Hemoglobin August 23, 2020 was 7.9.  Apparently Hemoccult positive stool.  She also has renal insufficiency.  Testing for Helicobacter pylori was negative.  She is on pantoprazole 40 mg daily.  She was placed on iron 3 times daily.  No overt bleeding by history.  REVIEW OF SYSTEMS:  All non-GI ROS negative unless otherwise stated in the HPI-confusion, arthritis.  Past Medical History:  Diagnosis Date   Acute duodenal ulcer with hemorrhage 03/26/2013   Acute myocardial infarction, unspecified site, episode of care unspecified    Allergic rhinitis, cause unspecified    Anemia, unspecified 04/05/2011   Benign neoplasm of colon    Congestive heart failure, unspecified    Coronary atherosclerosis of unspecified type of vessel, native or graft    Depressive disorder, not elsewhere classified    Disorder of bone and cartilage, unspecified    Diverticulosis of colon (without mention of hemorrhage)    Esophageal reflux    Gout  flare 02/06/2011   Gout, unspecified    Headache(784.0)    Morbid obesity ([place])    Need for prophylactic vaccination and inoculation against influenza    Osteoarthrosis, unspecified whether generalized or localized, unspecified site    Other and unspecified hyperlipidemia    Other malaise and fatigue    Pneumonia    Unspecified essential hypertension    Unspecified glaucoma(365.9)     Past Surgical History:  Procedure Laterality Date   CORONARY ANGIOPLASTY WITH STENT PLACEMENT     RCA stent   ECTOPIC PREGNANCY SURGERY     ESOPHAGOGASTRODUODENOSCOPY N/A 03/26/2013   Procedure: ESOPHAGOGASTRODUODENOSCOPY (EGD);  Surgeon: Gatha Mayer, MD;  Location: [facility] ENDOSCOPY;  Service: Endoscopy;  Laterality: N/A;   LEFT HEART CATHETERIZATION WITH CORONARY ANGIOGRAM N/A 09/11/2012   Procedure: LEFT HEART CATHETERIZATION WITH CORONARY ANGIOGRAM;  Surgeon: Larey Dresser, MD;  Location: [facility] CATH LAB;  Service: Cardiovascular;  Laterality: N/A;   Left hip replacement     s/p 2002. Secondary to DJD   VESICOVAGINAL FISTULA CLOSURE W/ TAH      Social History Beth Gutierrez  reports that she quit smoking about 30 years ago. Her smoking use included cigarettes. She has never used smokeless tobacco. She reports that she does not drink alcohol and does not use drugs.  family history includes Breast cancer in her sister; Hypertension in her father; Stroke in her father.  Allergies  Allergen Reactions   Sulfonamide Derivatives Other (See Comments)    unknown   Tramadol        PHYSICAL EXAMINATION:  Vital signs: BP 110/60 (BP Location: Left Wrist, Patient Position: Sitting, Cuff Size: Normal)   Pulse 68   Constitutional: Chronically ill-appearing in a large wheelchair, no acute distress Psychiatric: alert but not oriented, cannot follow commands Eyes: extraocular movements intact, anicteric, conjunctiva pink Mouth: Mask Neck: supple no lymphadenopathy Cardiovascular: heart regular rate  and rhythm, no murmur Lungs: clear to auscultation bilaterally Abdomen: soft, obese, nontender, nondistended, no obvious ascites, no peritoneal signs, normal bowel sounds, no organomegaly Rectal: Omitted Extremities: no clubbing or cyanosis.  1+ lower extremity edema bilaterally.  Both feet and ankles are in protective boots Skin: no gross lesions on visible extremities (feet covered up) Neuro: Rigid and somewhat contractured extremities  ASSESSMENT:  66.  87 year old female with advanced dementia and multiple medical problems who was sent today for Hemoccult positive stool.  No overt bleeding.  She does have a history of duodenal ulcer disease and Helicobacter pylori which has been treated.  Remote colonoscopy.  Patient is not an appropriate candidate for any endoscopic evaluations given her age and comorbidities.  Care for this patient is supportive in nature.   PLAN:  1.  Agree with iron supplementation 2.  Would increase pantoprazole to 40 mg twice daily 3.  Return to the care of your skilled nursing facility.  No additional routine GI follow-up is planned.  Discussed with daughter and granddaughter. A total time of 45 minutes was spent preparing to see the patient, reviewing test and outside records, obtaining comprehensive history, performing medically appropriate physical examination, counseling the patient and her family regarding her above listed issues, directing medical therapy, and documenting clinical information in the health record

## 2020-11-08 NOTE — Patient Instructions (Signed)
If you are age 85 or older, your body mass index should be between 23-30. Your There is no height or weight on file to calculate BMI. If this is out of the aforementioned range listed, please consider follow up with your Primary Care Provider.  If you are age 57 or younger, your body mass index should be between 19-25. Your There is no height or weight on file to calculate BMI. If this is out of the aformentioned range listed, please consider follow up with your Primary Care Provider.   __________________________________________________________  The [facility] providers would like to encourage you to use [facility] to communicate with providers for non-urgent requests or questions.  Due to long hold times on the telephone, sending your provider a message by [facility] may be a faster and more efficient way to get a response.  Please allow 48 business hours for a response.  Please remember that this is for non-urgent requests.   We have sent the following medications to your pharmacy for you to pick up at your convenience:  pantoprazole - increase to twice a day.  Continue iron

## 2020-11-10 DIAGNOSIS — Z23 Encounter for immunization: Secondary | ICD-10-CM | POA: Diagnosis not present

## 2020-12-09 DIAGNOSIS — L97509 Non-pressure chronic ulcer of other part of unspecified foot with unspecified severity: Secondary | ICD-10-CM | POA: Diagnosis not present

## 2020-12-09 DIAGNOSIS — R1310 Dysphagia, unspecified: Secondary | ICD-10-CM | POA: Diagnosis not present

## 2020-12-09 DIAGNOSIS — I2510 Atherosclerotic heart disease of native coronary artery without angina pectoris: Secondary | ICD-10-CM | POA: Diagnosis not present

## 2020-12-09 DIAGNOSIS — I509 Heart failure, unspecified: Secondary | ICD-10-CM | POA: Diagnosis not present

## 2020-12-09 DIAGNOSIS — N184 Chronic kidney disease, stage 4 (severe): Secondary | ICD-10-CM | POA: Diagnosis not present

## 2020-12-09 DIAGNOSIS — D638 Anemia in other chronic diseases classified elsewhere: Secondary | ICD-10-CM | POA: Diagnosis not present

## 2020-12-09 DIAGNOSIS — M109 Gout, unspecified: Secondary | ICD-10-CM | POA: Diagnosis not present

## 2020-12-09 DIAGNOSIS — I739 Peripheral vascular disease, unspecified: Secondary | ICD-10-CM | POA: Diagnosis not present

## 2020-12-09 DIAGNOSIS — R634 Abnormal weight loss: Secondary | ICD-10-CM | POA: Diagnosis not present

## 2020-12-13 DIAGNOSIS — I509 Heart failure, unspecified: Secondary | ICD-10-CM | POA: Diagnosis not present

## 2020-12-13 DIAGNOSIS — I10 Essential (primary) hypertension: Secondary | ICD-10-CM | POA: Diagnosis not present

## 2020-12-13 DIAGNOSIS — M6281 Muscle weakness (generalized): Secondary | ICD-10-CM | POA: Diagnosis not present

## 2020-12-13 DIAGNOSIS — R41841 Cognitive communication deficit: Secondary | ICD-10-CM | POA: Diagnosis not present

## 2020-12-13 DIAGNOSIS — R293 Abnormal posture: Secondary | ICD-10-CM | POA: Diagnosis not present

## 2020-12-13 DIAGNOSIS — R278 Other lack of coordination: Secondary | ICD-10-CM | POA: Diagnosis not present

## 2020-12-13 DIAGNOSIS — R1310 Dysphagia, unspecified: Secondary | ICD-10-CM | POA: Diagnosis not present

## 2020-12-13 DIAGNOSIS — I2510 Atherosclerotic heart disease of native coronary artery without angina pectoris: Secondary | ICD-10-CM | POA: Diagnosis not present

## 2020-12-13 DIAGNOSIS — R1312 Dysphagia, oropharyngeal phase: Secondary | ICD-10-CM | POA: Diagnosis not present

## 2020-12-14 DIAGNOSIS — I70234 Atherosclerosis of native arteries of right leg with ulceration of heel and midfoot: Secondary | ICD-10-CM | POA: Diagnosis not present

## 2020-12-15 DIAGNOSIS — I2510 Atherosclerotic heart disease of native coronary artery without angina pectoris: Secondary | ICD-10-CM | POA: Diagnosis not present

## 2020-12-15 DIAGNOSIS — I509 Heart failure, unspecified: Secondary | ICD-10-CM | POA: Diagnosis not present

## 2020-12-15 DIAGNOSIS — R278 Other lack of coordination: Secondary | ICD-10-CM | POA: Diagnosis not present

## 2020-12-15 DIAGNOSIS — R1312 Dysphagia, oropharyngeal phase: Secondary | ICD-10-CM | POA: Diagnosis not present

## 2020-12-15 DIAGNOSIS — R41841 Cognitive communication deficit: Secondary | ICD-10-CM | POA: Diagnosis not present

## 2020-12-15 DIAGNOSIS — M6281 Muscle weakness (generalized): Secondary | ICD-10-CM | POA: Diagnosis not present

## 2020-12-15 DIAGNOSIS — M24572 Contracture, left ankle: Secondary | ICD-10-CM | POA: Diagnosis not present

## 2020-12-15 DIAGNOSIS — R293 Abnormal posture: Secondary | ICD-10-CM | POA: Diagnosis not present

## 2020-12-15 DIAGNOSIS — R1310 Dysphagia, unspecified: Secondary | ICD-10-CM | POA: Diagnosis not present

## 2020-12-16 DIAGNOSIS — I509 Heart failure, unspecified: Secondary | ICD-10-CM | POA: Diagnosis not present

## 2020-12-16 DIAGNOSIS — R278 Other lack of coordination: Secondary | ICD-10-CM | POA: Diagnosis not present

## 2020-12-16 DIAGNOSIS — M6281 Muscle weakness (generalized): Secondary | ICD-10-CM | POA: Diagnosis not present

## 2020-12-16 DIAGNOSIS — R1310 Dysphagia, unspecified: Secondary | ICD-10-CM | POA: Diagnosis not present

## 2020-12-16 DIAGNOSIS — R1312 Dysphagia, oropharyngeal phase: Secondary | ICD-10-CM | POA: Diagnosis not present

## 2020-12-16 DIAGNOSIS — R293 Abnormal posture: Secondary | ICD-10-CM | POA: Diagnosis not present

## 2020-12-16 DIAGNOSIS — I2510 Atherosclerotic heart disease of native coronary artery without angina pectoris: Secondary | ICD-10-CM | POA: Diagnosis not present

## 2020-12-16 DIAGNOSIS — M24572 Contracture, left ankle: Secondary | ICD-10-CM | POA: Diagnosis not present

## 2020-12-16 DIAGNOSIS — R41841 Cognitive communication deficit: Secondary | ICD-10-CM | POA: Diagnosis not present

## 2020-12-17 DIAGNOSIS — R1310 Dysphagia, unspecified: Secondary | ICD-10-CM | POA: Diagnosis not present

## 2020-12-17 DIAGNOSIS — M24572 Contracture, left ankle: Secondary | ICD-10-CM | POA: Diagnosis not present

## 2020-12-17 DIAGNOSIS — M6281 Muscle weakness (generalized): Secondary | ICD-10-CM | POA: Diagnosis not present

## 2020-12-17 DIAGNOSIS — R41841 Cognitive communication deficit: Secondary | ICD-10-CM | POA: Diagnosis not present

## 2020-12-17 DIAGNOSIS — R278 Other lack of coordination: Secondary | ICD-10-CM | POA: Diagnosis not present

## 2020-12-17 DIAGNOSIS — R1312 Dysphagia, oropharyngeal phase: Secondary | ICD-10-CM | POA: Diagnosis not present

## 2020-12-17 DIAGNOSIS — R293 Abnormal posture: Secondary | ICD-10-CM | POA: Diagnosis not present

## 2020-12-17 DIAGNOSIS — I509 Heart failure, unspecified: Secondary | ICD-10-CM | POA: Diagnosis not present

## 2020-12-17 DIAGNOSIS — I2510 Atherosclerotic heart disease of native coronary artery without angina pectoris: Secondary | ICD-10-CM | POA: Diagnosis not present

## 2020-12-20 DIAGNOSIS — I2510 Atherosclerotic heart disease of native coronary artery without angina pectoris: Secondary | ICD-10-CM | POA: Diagnosis not present

## 2020-12-20 DIAGNOSIS — R41841 Cognitive communication deficit: Secondary | ICD-10-CM | POA: Diagnosis not present

## 2020-12-20 DIAGNOSIS — I509 Heart failure, unspecified: Secondary | ICD-10-CM | POA: Diagnosis not present

## 2020-12-20 DIAGNOSIS — M24572 Contracture, left ankle: Secondary | ICD-10-CM | POA: Diagnosis not present

## 2020-12-20 DIAGNOSIS — R293 Abnormal posture: Secondary | ICD-10-CM | POA: Diagnosis not present

## 2020-12-20 DIAGNOSIS — R278 Other lack of coordination: Secondary | ICD-10-CM | POA: Diagnosis not present

## 2020-12-20 DIAGNOSIS — M6281 Muscle weakness (generalized): Secondary | ICD-10-CM | POA: Diagnosis not present

## 2020-12-20 DIAGNOSIS — R1312 Dysphagia, oropharyngeal phase: Secondary | ICD-10-CM | POA: Diagnosis not present

## 2020-12-20 DIAGNOSIS — R1310 Dysphagia, unspecified: Secondary | ICD-10-CM | POA: Diagnosis not present

## 2020-12-21 DIAGNOSIS — R293 Abnormal posture: Secondary | ICD-10-CM | POA: Diagnosis not present

## 2020-12-21 DIAGNOSIS — R278 Other lack of coordination: Secondary | ICD-10-CM | POA: Diagnosis not present

## 2020-12-21 DIAGNOSIS — M6281 Muscle weakness (generalized): Secondary | ICD-10-CM | POA: Diagnosis not present

## 2020-12-21 DIAGNOSIS — M24572 Contracture, left ankle: Secondary | ICD-10-CM | POA: Diagnosis not present

## 2020-12-21 DIAGNOSIS — I509 Heart failure, unspecified: Secondary | ICD-10-CM | POA: Diagnosis not present

## 2020-12-21 DIAGNOSIS — S91104A Unspecified open wound of right lesser toe(s) without damage to nail, initial encounter: Secondary | ICD-10-CM | POA: Diagnosis not present

## 2020-12-21 DIAGNOSIS — I2510 Atherosclerotic heart disease of native coronary artery without angina pectoris: Secondary | ICD-10-CM | POA: Diagnosis not present

## 2020-12-21 DIAGNOSIS — R1312 Dysphagia, oropharyngeal phase: Secondary | ICD-10-CM | POA: Diagnosis not present

## 2020-12-21 DIAGNOSIS — R41841 Cognitive communication deficit: Secondary | ICD-10-CM | POA: Diagnosis not present

## 2020-12-21 DIAGNOSIS — R1310 Dysphagia, unspecified: Secondary | ICD-10-CM | POA: Diagnosis not present

## 2020-12-22 DIAGNOSIS — M6281 Muscle weakness (generalized): Secondary | ICD-10-CM | POA: Diagnosis not present

## 2020-12-22 DIAGNOSIS — I2510 Atherosclerotic heart disease of native coronary artery without angina pectoris: Secondary | ICD-10-CM | POA: Diagnosis not present

## 2020-12-22 DIAGNOSIS — R278 Other lack of coordination: Secondary | ICD-10-CM | POA: Diagnosis not present

## 2020-12-22 DIAGNOSIS — R1312 Dysphagia, oropharyngeal phase: Secondary | ICD-10-CM | POA: Diagnosis not present

## 2020-12-22 DIAGNOSIS — I509 Heart failure, unspecified: Secondary | ICD-10-CM | POA: Diagnosis not present

## 2020-12-22 DIAGNOSIS — R41841 Cognitive communication deficit: Secondary | ICD-10-CM | POA: Diagnosis not present

## 2020-12-22 DIAGNOSIS — R293 Abnormal posture: Secondary | ICD-10-CM | POA: Diagnosis not present

## 2020-12-22 DIAGNOSIS — M24572 Contracture, left ankle: Secondary | ICD-10-CM | POA: Diagnosis not present

## 2020-12-22 DIAGNOSIS — R1310 Dysphagia, unspecified: Secondary | ICD-10-CM | POA: Diagnosis not present

## 2020-12-23 DIAGNOSIS — R278 Other lack of coordination: Secondary | ICD-10-CM | POA: Diagnosis not present

## 2020-12-23 DIAGNOSIS — S91104D Unspecified open wound of right lesser toe(s) without damage to nail, subsequent encounter: Secondary | ICD-10-CM | POA: Diagnosis not present

## 2020-12-23 DIAGNOSIS — M24572 Contracture, left ankle: Secondary | ICD-10-CM | POA: Diagnosis not present

## 2020-12-23 DIAGNOSIS — I509 Heart failure, unspecified: Secondary | ICD-10-CM | POA: Diagnosis not present

## 2020-12-23 DIAGNOSIS — R1310 Dysphagia, unspecified: Secondary | ICD-10-CM | POA: Diagnosis not present

## 2020-12-23 DIAGNOSIS — R1312 Dysphagia, oropharyngeal phase: Secondary | ICD-10-CM | POA: Diagnosis not present

## 2020-12-23 DIAGNOSIS — R293 Abnormal posture: Secondary | ICD-10-CM | POA: Diagnosis not present

## 2020-12-23 DIAGNOSIS — I2510 Atherosclerotic heart disease of native coronary artery without angina pectoris: Secondary | ICD-10-CM | POA: Diagnosis not present

## 2020-12-23 DIAGNOSIS — M6281 Muscle weakness (generalized): Secondary | ICD-10-CM | POA: Diagnosis not present

## 2020-12-23 DIAGNOSIS — R41841 Cognitive communication deficit: Secondary | ICD-10-CM | POA: Diagnosis not present

## 2020-12-24 DIAGNOSIS — R41841 Cognitive communication deficit: Secondary | ICD-10-CM | POA: Diagnosis not present

## 2020-12-24 DIAGNOSIS — R1312 Dysphagia, oropharyngeal phase: Secondary | ICD-10-CM | POA: Diagnosis not present

## 2020-12-24 DIAGNOSIS — R278 Other lack of coordination: Secondary | ICD-10-CM | POA: Diagnosis not present

## 2020-12-24 DIAGNOSIS — R293 Abnormal posture: Secondary | ICD-10-CM | POA: Diagnosis not present

## 2020-12-24 DIAGNOSIS — M24572 Contracture, left ankle: Secondary | ICD-10-CM | POA: Diagnosis not present

## 2020-12-24 DIAGNOSIS — I509 Heart failure, unspecified: Secondary | ICD-10-CM | POA: Diagnosis not present

## 2020-12-24 DIAGNOSIS — I2510 Atherosclerotic heart disease of native coronary artery without angina pectoris: Secondary | ICD-10-CM | POA: Diagnosis not present

## 2020-12-24 DIAGNOSIS — M6281 Muscle weakness (generalized): Secondary | ICD-10-CM | POA: Diagnosis not present

## 2020-12-24 DIAGNOSIS — R1310 Dysphagia, unspecified: Secondary | ICD-10-CM | POA: Diagnosis not present

## 2020-12-25 DIAGNOSIS — R41841 Cognitive communication deficit: Secondary | ICD-10-CM | POA: Diagnosis not present

## 2020-12-25 DIAGNOSIS — R278 Other lack of coordination: Secondary | ICD-10-CM | POA: Diagnosis not present

## 2020-12-25 DIAGNOSIS — R1310 Dysphagia, unspecified: Secondary | ICD-10-CM | POA: Diagnosis not present

## 2020-12-25 DIAGNOSIS — I509 Heart failure, unspecified: Secondary | ICD-10-CM | POA: Diagnosis not present

## 2020-12-25 DIAGNOSIS — M24572 Contracture, left ankle: Secondary | ICD-10-CM | POA: Diagnosis not present

## 2020-12-25 DIAGNOSIS — R293 Abnormal posture: Secondary | ICD-10-CM | POA: Diagnosis not present

## 2020-12-25 DIAGNOSIS — M6281 Muscle weakness (generalized): Secondary | ICD-10-CM | POA: Diagnosis not present

## 2020-12-25 DIAGNOSIS — I2510 Atherosclerotic heart disease of native coronary artery without angina pectoris: Secondary | ICD-10-CM | POA: Diagnosis not present

## 2020-12-25 DIAGNOSIS — R1312 Dysphagia, oropharyngeal phase: Secondary | ICD-10-CM | POA: Diagnosis not present

## 2020-12-28 DIAGNOSIS — R1312 Dysphagia, oropharyngeal phase: Secondary | ICD-10-CM | POA: Diagnosis not present

## 2020-12-28 DIAGNOSIS — R293 Abnormal posture: Secondary | ICD-10-CM | POA: Diagnosis not present

## 2020-12-28 DIAGNOSIS — M6281 Muscle weakness (generalized): Secondary | ICD-10-CM | POA: Diagnosis not present

## 2020-12-28 DIAGNOSIS — S91101A Unspecified open wound of right great toe without damage to nail, initial encounter: Secondary | ICD-10-CM | POA: Diagnosis not present

## 2020-12-28 DIAGNOSIS — I509 Heart failure, unspecified: Secondary | ICD-10-CM | POA: Diagnosis not present

## 2020-12-28 DIAGNOSIS — R278 Other lack of coordination: Secondary | ICD-10-CM | POA: Diagnosis not present

## 2020-12-28 DIAGNOSIS — M24572 Contracture, left ankle: Secondary | ICD-10-CM | POA: Diagnosis not present

## 2020-12-28 DIAGNOSIS — S91104A Unspecified open wound of right lesser toe(s) without damage to nail, initial encounter: Secondary | ICD-10-CM | POA: Diagnosis not present

## 2020-12-28 DIAGNOSIS — R1310 Dysphagia, unspecified: Secondary | ICD-10-CM | POA: Diagnosis not present

## 2020-12-28 DIAGNOSIS — R41841 Cognitive communication deficit: Secondary | ICD-10-CM | POA: Diagnosis not present

## 2020-12-28 DIAGNOSIS — I2510 Atherosclerotic heart disease of native coronary artery without angina pectoris: Secondary | ICD-10-CM | POA: Diagnosis not present

## 2020-12-29 DIAGNOSIS — R1312 Dysphagia, oropharyngeal phase: Secondary | ICD-10-CM | POA: Diagnosis not present

## 2020-12-29 DIAGNOSIS — M6281 Muscle weakness (generalized): Secondary | ICD-10-CM | POA: Diagnosis not present

## 2020-12-29 DIAGNOSIS — I2510 Atherosclerotic heart disease of native coronary artery without angina pectoris: Secondary | ICD-10-CM | POA: Diagnosis not present

## 2020-12-29 DIAGNOSIS — I509 Heart failure, unspecified: Secondary | ICD-10-CM | POA: Diagnosis not present

## 2020-12-29 DIAGNOSIS — R1310 Dysphagia, unspecified: Secondary | ICD-10-CM | POA: Diagnosis not present

## 2020-12-29 DIAGNOSIS — M24572 Contracture, left ankle: Secondary | ICD-10-CM | POA: Diagnosis not present

## 2020-12-29 DIAGNOSIS — R293 Abnormal posture: Secondary | ICD-10-CM | POA: Diagnosis not present

## 2020-12-29 DIAGNOSIS — R41841 Cognitive communication deficit: Secondary | ICD-10-CM | POA: Diagnosis not present

## 2020-12-29 DIAGNOSIS — R278 Other lack of coordination: Secondary | ICD-10-CM | POA: Diagnosis not present

## 2020-12-30 DIAGNOSIS — N184 Chronic kidney disease, stage 4 (severe): Secondary | ICD-10-CM | POA: Diagnosis not present

## 2020-12-30 DIAGNOSIS — I2510 Atherosclerotic heart disease of native coronary artery without angina pectoris: Secondary | ICD-10-CM | POA: Diagnosis not present

## 2020-12-30 DIAGNOSIS — R1312 Dysphagia, oropharyngeal phase: Secondary | ICD-10-CM | POA: Diagnosis not present

## 2020-12-30 DIAGNOSIS — M6281 Muscle weakness (generalized): Secondary | ICD-10-CM | POA: Diagnosis not present

## 2020-12-30 DIAGNOSIS — M24572 Contracture, left ankle: Secondary | ICD-10-CM | POA: Diagnosis not present

## 2020-12-30 DIAGNOSIS — R293 Abnormal posture: Secondary | ICD-10-CM | POA: Diagnosis not present

## 2020-12-30 DIAGNOSIS — R1310 Dysphagia, unspecified: Secondary | ICD-10-CM | POA: Diagnosis not present

## 2020-12-30 DIAGNOSIS — R278 Other lack of coordination: Secondary | ICD-10-CM | POA: Diagnosis not present

## 2020-12-30 DIAGNOSIS — I509 Heart failure, unspecified: Secondary | ICD-10-CM | POA: Diagnosis not present

## 2020-12-30 DIAGNOSIS — R41841 Cognitive communication deficit: Secondary | ICD-10-CM | POA: Diagnosis not present

## 2020-12-30 DIAGNOSIS — D638 Anemia in other chronic diseases classified elsewhere: Secondary | ICD-10-CM | POA: Diagnosis not present

## 2021-01-01 DIAGNOSIS — R1310 Dysphagia, unspecified: Secondary | ICD-10-CM | POA: Diagnosis not present

## 2021-01-01 DIAGNOSIS — M24572 Contracture, left ankle: Secondary | ICD-10-CM | POA: Diagnosis not present

## 2021-01-01 DIAGNOSIS — M6281 Muscle weakness (generalized): Secondary | ICD-10-CM | POA: Diagnosis not present

## 2021-01-01 DIAGNOSIS — R278 Other lack of coordination: Secondary | ICD-10-CM | POA: Diagnosis not present

## 2021-01-01 DIAGNOSIS — I2510 Atherosclerotic heart disease of native coronary artery without angina pectoris: Secondary | ICD-10-CM | POA: Diagnosis not present

## 2021-01-01 DIAGNOSIS — I509 Heart failure, unspecified: Secondary | ICD-10-CM | POA: Diagnosis not present

## 2021-01-01 DIAGNOSIS — R41841 Cognitive communication deficit: Secondary | ICD-10-CM | POA: Diagnosis not present

## 2021-01-01 DIAGNOSIS — R293 Abnormal posture: Secondary | ICD-10-CM | POA: Diagnosis not present

## 2021-01-01 DIAGNOSIS — R1312 Dysphagia, oropharyngeal phase: Secondary | ICD-10-CM | POA: Diagnosis not present

## 2021-01-04 DIAGNOSIS — M6281 Muscle weakness (generalized): Secondary | ICD-10-CM | POA: Diagnosis not present

## 2021-01-04 DIAGNOSIS — M24572 Contracture, left ankle: Secondary | ICD-10-CM | POA: Diagnosis not present

## 2021-01-04 DIAGNOSIS — R1310 Dysphagia, unspecified: Secondary | ICD-10-CM | POA: Diagnosis not present

## 2021-01-04 DIAGNOSIS — I2510 Atherosclerotic heart disease of native coronary artery without angina pectoris: Secondary | ICD-10-CM | POA: Diagnosis not present

## 2021-01-04 DIAGNOSIS — R278 Other lack of coordination: Secondary | ICD-10-CM | POA: Diagnosis not present

## 2021-01-04 DIAGNOSIS — R293 Abnormal posture: Secondary | ICD-10-CM | POA: Diagnosis not present

## 2021-01-04 DIAGNOSIS — I509 Heart failure, unspecified: Secondary | ICD-10-CM | POA: Diagnosis not present

## 2021-01-04 DIAGNOSIS — S91101A Unspecified open wound of right great toe without damage to nail, initial encounter: Secondary | ICD-10-CM | POA: Diagnosis not present

## 2021-01-04 DIAGNOSIS — S91104A Unspecified open wound of right lesser toe(s) without damage to nail, initial encounter: Secondary | ICD-10-CM | POA: Diagnosis not present

## 2021-01-04 DIAGNOSIS — R41841 Cognitive communication deficit: Secondary | ICD-10-CM | POA: Diagnosis not present

## 2021-01-04 DIAGNOSIS — R1312 Dysphagia, oropharyngeal phase: Secondary | ICD-10-CM | POA: Diagnosis not present

## 2021-01-07 DIAGNOSIS — I509 Heart failure, unspecified: Secondary | ICD-10-CM | POA: Diagnosis not present

## 2021-01-11 DIAGNOSIS — I70235 Atherosclerosis of native arteries of right leg with ulceration of other part of foot: Secondary | ICD-10-CM | POA: Diagnosis not present

## 2021-01-11 DIAGNOSIS — S91101A Unspecified open wound of right great toe without damage to nail, initial encounter: Secondary | ICD-10-CM | POA: Diagnosis not present

## 2021-01-11 DIAGNOSIS — S91104A Unspecified open wound of right lesser toe(s) without damage to nail, initial encounter: Secondary | ICD-10-CM | POA: Diagnosis not present

## 2021-01-13 DIAGNOSIS — D464 Refractory anemia, unspecified: Secondary | ICD-10-CM | POA: Diagnosis not present

## 2021-01-13 DIAGNOSIS — D510 Vitamin B12 deficiency anemia due to intrinsic factor deficiency: Secondary | ICD-10-CM | POA: Diagnosis not present

## 2021-01-18 DIAGNOSIS — I70235 Atherosclerosis of native arteries of right leg with ulceration of other part of foot: Secondary | ICD-10-CM | POA: Diagnosis not present

## 2021-01-18 DIAGNOSIS — S91104A Unspecified open wound of right lesser toe(s) without damage to nail, initial encounter: Secondary | ICD-10-CM | POA: Diagnosis not present

## 2021-01-18 DIAGNOSIS — S91101A Unspecified open wound of right great toe without damage to nail, initial encounter: Secondary | ICD-10-CM | POA: Diagnosis not present

## 2021-01-20 DIAGNOSIS — S91104D Unspecified open wound of right lesser toe(s) without damage to nail, subsequent encounter: Secondary | ICD-10-CM | POA: Diagnosis not present

## 2021-01-20 DIAGNOSIS — I70235 Atherosclerosis of native arteries of right leg with ulceration of other part of foot: Secondary | ICD-10-CM | POA: Diagnosis not present

## 2021-01-25 DIAGNOSIS — L89896 Pressure-induced deep tissue damage of other site: Secondary | ICD-10-CM | POA: Diagnosis not present

## 2021-01-25 DIAGNOSIS — D638 Anemia in other chronic diseases classified elsewhere: Secondary | ICD-10-CM | POA: Diagnosis not present

## 2021-01-25 DIAGNOSIS — N184 Chronic kidney disease, stage 4 (severe): Secondary | ICD-10-CM | POA: Diagnosis not present

## 2021-01-25 DIAGNOSIS — I509 Heart failure, unspecified: Secondary | ICD-10-CM | POA: Diagnosis not present

## 2021-01-25 DIAGNOSIS — F0390 Unspecified dementia without behavioral disturbance: Secondary | ICD-10-CM | POA: Diagnosis not present

## 2021-01-25 DIAGNOSIS — I70235 Atherosclerosis of native arteries of right leg with ulceration of other part of foot: Secondary | ICD-10-CM | POA: Diagnosis not present

## 2021-01-25 DIAGNOSIS — S91104A Unspecified open wound of right lesser toe(s) without damage to nail, initial encounter: Secondary | ICD-10-CM | POA: Diagnosis not present

## 2021-01-25 DIAGNOSIS — I10 Essential (primary) hypertension: Secondary | ICD-10-CM | POA: Diagnosis not present

## 2021-02-01 DIAGNOSIS — S91104A Unspecified open wound of right lesser toe(s) without damage to nail, initial encounter: Secondary | ICD-10-CM | POA: Diagnosis not present

## 2021-02-01 DIAGNOSIS — I70235 Atherosclerosis of native arteries of right leg with ulceration of other part of foot: Secondary | ICD-10-CM | POA: Diagnosis not present

## 2021-02-01 DIAGNOSIS — L89896 Pressure-induced deep tissue damage of other site: Secondary | ICD-10-CM | POA: Diagnosis not present

## 2021-02-10 DIAGNOSIS — S91104A Unspecified open wound of right lesser toe(s) without damage to nail, initial encounter: Secondary | ICD-10-CM | POA: Diagnosis not present

## 2021-02-10 DIAGNOSIS — I70235 Atherosclerosis of native arteries of right leg with ulceration of other part of foot: Secondary | ICD-10-CM | POA: Diagnosis not present

## 2021-02-15 ENCOUNTER — Inpatient Hospital Stay (HOSPITAL_COMMUNITY)
Admission: EM | Admit: 2021-02-15 | Discharge: 2021-02-16 | DRG: 951 | Disposition: A | Discharge status: Hospice | Source: Skilled Nursing Facility | Attending: Internal Medicine | Admitting: Internal Medicine

## 2021-02-15 ENCOUNTER — Other Ambulatory Visit: Payer: Self-pay

## 2021-02-15 ENCOUNTER — Encounter (HOSPITAL_COMMUNITY): Payer: Self-pay

## 2021-02-15 ENCOUNTER — Emergency Department (HOSPITAL_COMMUNITY)

## 2021-02-15 DIAGNOSIS — R404 Transient alteration of awareness: Secondary | ICD-10-CM | POA: Diagnosis not present

## 2021-02-15 DIAGNOSIS — F0390 Unspecified dementia without behavioral disturbance: Secondary | ICD-10-CM | POA: Diagnosis present

## 2021-02-15 DIAGNOSIS — R6520 Severe sepsis without septic shock: Secondary | ICD-10-CM | POA: Diagnosis present

## 2021-02-15 DIAGNOSIS — N390 Urinary tract infection, site not specified: Secondary | ICD-10-CM | POA: Diagnosis present

## 2021-02-15 DIAGNOSIS — E8809 Other disorders of plasma-protein metabolism, not elsewhere classified: Secondary | ICD-10-CM | POA: Diagnosis present

## 2021-02-15 DIAGNOSIS — Z7982 Long term (current) use of aspirin: Secondary | ICD-10-CM | POA: Diagnosis not present

## 2021-02-15 DIAGNOSIS — Z803 Family history of malignant neoplasm of breast: Secondary | ICD-10-CM

## 2021-02-15 DIAGNOSIS — R509 Fever, unspecified: Secondary | ICD-10-CM | POA: Diagnosis not present

## 2021-02-15 DIAGNOSIS — Z79899 Other long term (current) drug therapy: Secondary | ICD-10-CM

## 2021-02-15 DIAGNOSIS — J189 Pneumonia, unspecified organism: Secondary | ICD-10-CM | POA: Diagnosis not present

## 2021-02-15 DIAGNOSIS — Z882 Allergy status to sulfonamides status: Secondary | ICD-10-CM

## 2021-02-15 DIAGNOSIS — E860 Dehydration: Secondary | ICD-10-CM | POA: Diagnosis present

## 2021-02-15 DIAGNOSIS — M8580 Other specified disorders of bone density and structure, unspecified site: Secondary | ICD-10-CM | POA: Diagnosis present

## 2021-02-15 DIAGNOSIS — Z66 Do not resuscitate: Secondary | ICD-10-CM | POA: Diagnosis present

## 2021-02-15 DIAGNOSIS — L89612 Pressure ulcer of right heel, stage 2: Secondary | ICD-10-CM | POA: Diagnosis present

## 2021-02-15 DIAGNOSIS — I5022 Chronic systolic (congestive) heart failure: Secondary | ICD-10-CM | POA: Diagnosis present

## 2021-02-15 DIAGNOSIS — S91104A Unspecified open wound of right lesser toe(s) without damage to nail, initial encounter: Secondary | ICD-10-CM | POA: Diagnosis not present

## 2021-02-15 DIAGNOSIS — R000 Tachycardia, unspecified: Secondary | ICD-10-CM | POA: Diagnosis not present

## 2021-02-15 DIAGNOSIS — E785 Hyperlipidemia, unspecified: Secondary | ICD-10-CM | POA: Diagnosis present

## 2021-02-15 DIAGNOSIS — I517 Cardiomegaly: Secondary | ICD-10-CM | POA: Diagnosis not present

## 2021-02-15 DIAGNOSIS — I2510 Atherosclerotic heart disease of native coronary artery without angina pectoris: Secondary | ICD-10-CM | POA: Diagnosis present

## 2021-02-15 DIAGNOSIS — Z823 Family history of stroke: Secondary | ICD-10-CM

## 2021-02-15 DIAGNOSIS — L89152 Pressure ulcer of sacral region, stage 2: Secondary | ICD-10-CM | POA: Diagnosis present

## 2021-02-15 DIAGNOSIS — R4182 Altered mental status, unspecified: Secondary | ICD-10-CM | POA: Insufficient documentation

## 2021-02-15 DIAGNOSIS — I130 Hypertensive heart and chronic kidney disease with heart failure and stage 1 through stage 4 chronic kidney disease, or unspecified chronic kidney disease: Secondary | ICD-10-CM | POA: Diagnosis present

## 2021-02-15 DIAGNOSIS — A419 Sepsis, unspecified organism: Secondary | ICD-10-CM

## 2021-02-15 DIAGNOSIS — E875 Hyperkalemia: Secondary | ICD-10-CM | POA: Diagnosis present

## 2021-02-15 DIAGNOSIS — I252 Old myocardial infarction: Secondary | ICD-10-CM

## 2021-02-15 DIAGNOSIS — Z20822 Contact with and (suspected) exposure to covid-19: Secondary | ICD-10-CM | POA: Diagnosis not present

## 2021-02-15 DIAGNOSIS — Z885 Allergy status to narcotic agent status: Secondary | ICD-10-CM

## 2021-02-15 DIAGNOSIS — N179 Acute kidney failure, unspecified: Secondary | ICD-10-CM | POA: Diagnosis not present

## 2021-02-15 DIAGNOSIS — Z743 Need for continuous supervision: Secondary | ICD-10-CM | POA: Diagnosis not present

## 2021-02-15 DIAGNOSIS — R0902 Hypoxemia: Secondary | ICD-10-CM | POA: Diagnosis not present

## 2021-02-15 DIAGNOSIS — E870 Hyperosmolality and hypernatremia: Secondary | ICD-10-CM | POA: Diagnosis not present

## 2021-02-15 DIAGNOSIS — N182 Chronic kidney disease, stage 2 (mild): Secondary | ICD-10-CM | POA: Diagnosis present

## 2021-02-15 DIAGNOSIS — Z8249 Family history of ischemic heart disease and other diseases of the circulatory system: Secondary | ICD-10-CM

## 2021-02-15 DIAGNOSIS — Z515 Encounter for palliative care: Principal | ICD-10-CM

## 2021-02-15 DIAGNOSIS — R17 Unspecified jaundice: Secondary | ICD-10-CM | POA: Diagnosis present

## 2021-02-15 DIAGNOSIS — R6889 Other general symptoms and signs: Secondary | ICD-10-CM | POA: Diagnosis not present

## 2021-02-15 DIAGNOSIS — Z87891 Personal history of nicotine dependence: Secondary | ICD-10-CM

## 2021-02-15 DIAGNOSIS — I70235 Atherosclerosis of native arteries of right leg with ulceration of other part of foot: Secondary | ICD-10-CM | POA: Diagnosis not present

## 2021-02-15 LAB — URINALYSIS, ROUTINE W REFLEX MICROSCOPIC
Bilirubin Urine: NEGATIVE
Glucose, UA: NEGATIVE mg/dL
Ketones, ur: NEGATIVE mg/dL
Nitrite: NEGATIVE
Protein, ur: 100 mg/dL — AB
Specific Gravity, Urine: 1.015 (ref 1.005–1.030)
WBC, UA: >50 WBC/hpf — ABNORMAL HIGH (ref 0–5)
pH: 7 (ref 5.0–8.0)

## 2021-02-15 LAB — POC OCCULT BLOOD, ED: Fecal Occult Bld: NEGATIVE

## 2021-02-15 LAB — COMPREHENSIVE METABOLIC PANEL
ALT: 17 U/L (ref 0–44)
AST: 45 U/L — ABNORMAL HIGH (ref 15–41)
Albumin: 3.5 g/dL (ref 3.5–5.0)
Alkaline Phosphatase: 71 U/L (ref 38–126)
Anion gap: 11 (ref 5–15)
BUN: 83 mg/dL — ABNORMAL HIGH (ref 8–23)
CO2: 18 mmol/L — ABNORMAL LOW (ref 22–32)
Calcium: 8 mg/dL — ABNORMAL LOW (ref 8.9–10.3)
Chloride: 117 mmol/L — ABNORMAL HIGH (ref 98–111)
Creatinine, Ser: 3.29 mg/dL — ABNORMAL HIGH (ref 0.44–1.00)
GFR, Estimated: 12 mL/min — ABNORMAL LOW (ref >60)
Glucose, Bld: 116 mg/dL — ABNORMAL HIGH (ref 70–99)
Potassium: 5.9 mmol/L — ABNORMAL HIGH (ref 3.5–5.1)
Sodium: 146 mmol/L — ABNORMAL HIGH (ref 135–145)
Total Bilirubin: 1.8 mg/dL — ABNORMAL HIGH (ref 0.3–1.2)
Total Protein: 7.7 g/dL (ref 6.5–8.1)

## 2021-02-15 LAB — CBC WITH DIFFERENTIAL/PLATELET
Abs Immature Granulocytes: 0.65 10*3/uL — ABNORMAL HIGH (ref 0.00–0.07)
Basophils Absolute: 0.1 10*3/uL (ref 0.0–0.1)
Basophils Relative: 0 %
Eosinophils Absolute: 0 10*3/uL (ref 0.0–0.5)
Eosinophils Relative: 0 %
HCT: 28.6 % — ABNORMAL LOW (ref 36.0–46.0)
Hemoglobin: 8.3 g/dL — ABNORMAL LOW (ref 12.0–15.0)
Immature Granulocytes: 2 %
Lymphocytes Relative: 1 %
Lymphs Abs: 0.3 10*3/uL — ABNORMAL LOW (ref 0.7–4.0)
MCH: 32.2 pg (ref 26.0–34.0)
MCHC: 29 g/dL — ABNORMAL LOW (ref 30.0–36.0)
MCV: 110.9 fL — ABNORMAL HIGH (ref 80.0–100.0)
Monocytes Absolute: 4.1 10*3/uL — ABNORMAL HIGH (ref 0.1–1.0)
Monocytes Relative: 12 %
Neutro Abs: 28.2 10*3/uL — ABNORMAL HIGH (ref 1.7–7.7)
Neutrophils Relative %: 85 %
Platelets: 338 10*3/uL (ref 150–400)
RBC: 2.58 MIL/uL — ABNORMAL LOW (ref 3.87–5.11)
RDW: 19.1 % — ABNORMAL HIGH (ref 11.5–15.5)
WBC: 33.3 10*3/uL — ABNORMAL HIGH (ref 4.0–10.5)
nRBC: 0.1 % (ref 0.0–0.2)

## 2021-02-15 LAB — LACTIC ACID, PLASMA
Lactic Acid, Venous: 2.6 mmol/L (ref 0.5–1.9)
Lactic Acid, Venous: 2.2 mmol/L (ref 0.5–1.9)

## 2021-02-15 LAB — RESP PANEL BY RT-PCR (FLU A&B, COVID) ARPGX2
Influenza A by PCR: NEGATIVE
Influenza B by PCR: NEGATIVE
SARS Coronavirus 2 by RT PCR: NEGATIVE

## 2021-02-15 MED ORDER — HALOPERIDOL 1 MG PO TABS: 0.5000 mg | ORAL_TABLET | ORAL | Status: DC | PRN

## 2021-02-15 MED ORDER — GLYCOPYRROLATE 0.2 MG/ML IJ SOLN: 0.2000 mg | INTRAMUSCULAR | Status: DC | PRN

## 2021-02-15 MED ORDER — AZITHROMYCIN 500 MG IV SOLR
500.0000 mg | Freq: Once | INTRAVENOUS | Status: AC
  Administered 2021-02-15: 500 mg via INTRAVENOUS
  Filled 2021-02-15: qty 5

## 2021-02-15 MED ORDER — GLYCOPYRROLATE 1 MG PO TABS
1.0000 mg | ORAL_TABLET | ORAL | Status: DC | PRN
  Filled 2021-02-15: qty 1

## 2021-02-15 MED ORDER — MORPHINE SULFATE (PF) 2 MG/ML IV SOLN
1.0000 mg | INTRAVENOUS | Status: DC | PRN
  Administered 2021-02-15: 1 mg via INTRAVENOUS
  Filled 2021-02-15: qty 1

## 2021-02-15 MED ORDER — DEXTROSE 5 % IV SOLN: INTRAVENOUS | Status: DC

## 2021-02-15 MED ORDER — HALOPERIDOL LACTATE 2 MG/ML PO CONC
0.5000 mg | ORAL | Status: DC | PRN
  Filled 2021-02-15: qty 0.3

## 2021-02-15 MED ORDER — BIOTENE DRY MOUTH MT LIQD: 15.0000 mL | OROMUCOSAL | Status: DC | PRN

## 2021-02-15 MED ORDER — HALOPERIDOL LACTATE 5 MG/ML IJ SOLN: 0.5000 mg | INTRAMUSCULAR | Status: DC | PRN

## 2021-02-15 MED ORDER — CEFTRIAXONE SODIUM 1 G IJ SOLR
1.0000 g | Freq: Once | INTRAMUSCULAR | Status: AC
Start: 2021-02-15 — End: 2021-02-15
  Administered 2021-02-15: 1 g via INTRAVENOUS
  Filled 2021-02-15: qty 10

## 2021-02-15 MED ORDER — ACETAMINOPHEN 325 MG PO TABS: 650.0000 mg | ORAL_TABLET | Freq: Four times a day (QID) | ORAL | Status: DC | PRN

## 2021-02-15 MED ORDER — SODIUM CHLORIDE 0.9 % IV BOLUS
500.0000 mL | Freq: Once | INTRAVENOUS | Status: AC
  Administered 2021-02-15: 500 mL via INTRAVENOUS

## 2021-02-15 MED ORDER — ONDANSETRON HCL 4 MG PO TABS: 4.0000 mg | ORAL_TABLET | Freq: Four times a day (QID) | ORAL | Status: DC | PRN

## 2021-02-15 MED ORDER — ONDANSETRON HCL 4 MG/2ML IJ SOLN: 4.0000 mg | Freq: Four times a day (QID) | INTRAMUSCULAR | Status: DC | PRN

## 2021-02-15 MED ORDER — ACETAMINOPHEN 650 MG RE SUPP: 650.0000 mg | Freq: Four times a day (QID) | RECTAL | Status: DC | PRN

## 2021-02-15 MED ORDER — POLYVINYL ALCOHOL 1.4 % OP SOLN
1.0000 [drp] | Freq: Four times a day (QID) | OPHTHALMIC | Status: DC | PRN
  Filled 2021-02-15: qty 15

## 2021-02-15 NOTE — ED Notes (Signed)
Report given to Presenter, broadcasting at [facility].

## 2021-02-15 NOTE — Discharge Summary (Signed)
Physician Discharge Summary  Beth Gutierrez JXB:[id number] DOB: April 12, 1921 DOA: 02/15/2021  PCP: Merryl Hacker, No  Admit date: 02/15/2021 Discharge date: 02/15/2021  Admitted From: SNF. Disposition: Residential hospice.  Recommendations for Outpatient Follow-up:  Follow up with PCP in 1-2 weeks Please obtain BMP/CBC in one week Please follow up on the following pending results:  Home Health: No. Equipment/Devices: Discharge Condition: Hospice. CODE STATUS: DNR/comfort care. Diet recommendation: Comfort care measures only.  Brief/Interim Summary: Beth Gutierrez is a 88 y.o. female with below past medical including a history of dementia who was sent from her facility to the emergency department for evaluation of altered mental status and possible sepsis.  Work-up showed concern for pneumonia and UTI.  After discussion with her granddaughter Nia Melick (POA) and [employer], they decided that it was best to provide comfort care only and requested discharge to residential hospice.  Discharge Diagnoses:  Principal Problem:   Sepsis due to undetermined organism POA [facility]) Active Problems:   Chronic systolic heart failure (HCC)   CKD (chronic kidney disease) stage 2, GFR 60-89 ml/min   Hypernatremia   Hyperkalemia   AKI (acute kidney injury) ([place])   Hyperbilirubinemia  Discharge Instructions  Allergies as of 02/15/2021       Reactions   Sulfonamide Derivatives Other (See Comments)   unknown   Tramadol         Medication List     STOP taking these medications    acetaminophen 500 MG tablet Commonly known as: TYLENOL   AMBULATORY NON FORMULARY MEDICATION   amLODipine 5 MG tablet Commonly known as: NORVASC   aspirin EC 81 MG tablet   b complex vitamins capsule   bisacodyl 10 MG suppository Commonly known as: DULCOLAX   ergocalciferol 1.25 MG (50000 UT) capsule Commonly known as: VITAMIN D2   famotidine 20 MG tablet Commonly known as: PEPCID    ferrous sulfate 325 (65 FE) MG tablet   LORazepam 0.5 MG tablet Commonly known as: ATIVAN   melatonin 5 MG Tabs   metoprolol succinate 25 MG 24 hr tablet Commonly known as: TOPROL-XL   Nutritional Supplement Liqd   OXYGEN   pantoprazole 40 MG tablet Commonly known as: PROTONIX   polyethylene glycol 17 g packet Commonly known as: MIRALAX / GLYCOLAX   polyvinyl alcohol 1.4 % ophthalmic solution Commonly known as: LIQUIFILM TEARS   prochlorperazine 10 MG tablet Commonly known as: COMPAZINE   sennosides-docusate sodium 8.6-50 MG tablet Commonly known as: SENOKOT-S        Allergies  Allergen Reactions   Sulfonamide Derivatives Other (See Comments)    unknown   Tramadol     Consultations:    Procedures/Studies: DG Chest Portable 1 View  Result Date: 02/15/2021 CLINICAL DATA:  Shortness of breath, possible sepsis EXAM: PORTABLE CHEST 1 VIEW COMPARISON:  11/27/2014 FINDINGS: Mild cardiomegaly. Both lungs are clear. Severe bilateral glenohumeral arthrosis. IMPRESSION: Mild cardiomegaly without acute abnormality of the lungs in AP portable projection. No focal airspace opacity. Electronically Signed   By: Delanna Ahmadi M.D.   On: 02/15/2021 10:21    Subjective:   Discharge Exam: Vitals:   02/15/21 1101 02/15/21 1130  BP: (!) 134/47 (!) 134/49  Pulse: (!) 111 (!) 110  Resp: (!) 21 (!) 22  Temp:    SpO2: 99% 100%   Constitutional: Looks acutely ill.  Obtunded. Eyes: PERRL, lids and conjunctivae normal ENMT: Mucous membranes and lips are dry.  Posterior pharynx clear of any exudate or lesions. Neck: normal, supple,  no masses, no thyromegaly Respiratory: Tachypneic, diminished at the bases, no wheezing, no crackles. No accessory muscle use.  Cardiovascular: Tachycardic, no murmurs / rubs / gallops. No extremity edema. 2+ pedal pulses. No carotid bruits.  Abdomen: No distention.  Soft, no tenderness, no masses palpated. No hepatosplenomegaly. Bowel sounds  positive.  Musculoskeletal: no clubbing / cyanosis. Good ROM, no contractures.  Relaxed muscle tone.  Skin: Stage II pressure ulcer in her sacral area and right foot POA. Neurologic: Obtunded.  Correctly nonfocal. Psychiatric: Obtunded.  The results of significant diagnostics from this hospitalization (including imaging, microbiology, ancillary and laboratory) are listed below for reference.     Microbiology: Recent Results (from the past 240 hour(s))  Resp Panel by RT-PCR (Flu A&B, Covid) Nasopharyngeal Swab     Status: None   Collection Time: 02/15/21 10:15 AM   Specimen: Nasopharyngeal Swab; Nasopharyngeal(NP) swabs in vial transport medium  Result Value Ref Range Status   SARS Coronavirus 2 by RT PCR NEGATIVE NEGATIVE Final    Comment: (NOTE) SARS-CoV-2 target nucleic acids are NOT DETECTED.  The SARS-CoV-2 RNA is generally detectable in upper respiratory specimens during the acute phase of infection. The lowest concentration of SARS-CoV-2 viral copies this assay can detect is 138 copies/mL. A negative result does not preclude SARS-Cov-2 infection and should not be used as the sole basis for treatment or other patient management decisions. A negative result may occur with  improper specimen collection/handling, submission of specimen other than nasopharyngeal swab, presence of viral mutation(s) within the areas targeted by this assay, and inadequate number of viral copies(<138 copies/mL). A negative result must be combined with clinical observations, patient history, and epidemiological information. The expected result is Negative.  Fact Sheet for Patients:  EntrepreneurPulse.com.au  Fact Sheet for Healthcare Providers:  IncredibleEmployment.be  This test is no t yet approved or cleared by the [place] FDA and  has been authorized for detection and/or diagnosis of SARS-CoV-2 by FDA under an Emergency Use Authorization (EUA). This EUA  will remain  in effect (meaning this test can be used) for the duration of the COVID-19 declaration under Section 564(b)(1) of the Act, 21 U.S.C.section 360bbb-3(b)(1), unless the authorization is terminated  or revoked sooner.       Influenza A by PCR NEGATIVE NEGATIVE Final   Influenza B by PCR NEGATIVE NEGATIVE Final    Comment: (NOTE) The Xpert Xpress SARS-CoV-2/FLU/RSV plus assay is intended as an aid in the diagnosis of influenza from Nasopharyngeal swab specimens and should not be used as a sole basis for treatment. Nasal washings and aspirates are unacceptable for Xpert Xpress SARS-CoV-2/FLU/RSV testing.  Fact Sheet for Patients: EntrepreneurPulse.com.au  Fact Sheet for Healthcare Providers: IncredibleEmployment.be  This test is not yet approved or cleared by the [place] FDA and has been authorized for detection and/or diagnosis of SARS-CoV-2 by FDA under an Emergency Use Authorization (EUA). This EUA will remain in effect (meaning this test can be used) for the duration of the COVID-19 declaration under Section 564(b)(1) of the Act, 21 U.S.C. section 360bbb-3(b)(1), unless the authorization is terminated or revoked.  Performed at [facility], [place] [address] [place], [place] [address]      Labs: BNP (last 3 results) No results for input(s): BNP in the last 8760 hours. Basic Metabolic Panel: Recent Labs  Lab 02/15/21 1000  NA 146*  K 5.9*  CL 117*  CO2 18*  GLUCOSE 116*  BUN 83*  CREATININE 3.29*  CALCIUM 8.0*   Liver  Function Tests: Recent Labs  Lab 02/15/21 1000  AST 45*  ALT 17  ALKPHOS 71  BILITOT 1.8*  PROT 7.7  ALBUMIN 3.5   No results for input(s): LIPASE, AMYLASE in the last 168 hours. No results for input(s): AMMONIA in the last 168 hours. CBC: Recent Labs  Lab 02/15/21 1000  WBC 33.3*  NEUTROABS 28.2*  HGB 8.3*  HCT 28.6*  MCV 110.9*  PLT 338   Cardiac  Enzymes: No results for input(s): CKTOTAL, CKMB, CKMBINDEX, TROPONINI in the last 168 hours. BNP: Invalid input(s): POCBNP CBG: No results for input(s): GLUCAP in the last 168 hours. D-Dimer No results for input(s): DDIMER in the last 72 hours. Hgb A1c No results for input(s): HGBA1C in the last 72 hours. Lipid Profile No results for input(s): CHOL, HDL, LDLCALC, TRIG, CHOLHDL, LDLDIRECT in the last 72 hours. Thyroid function studies No results for input(s): TSH, T4TOTAL, T3FREE, THYROIDAB in the last 72 hours.  Invalid input(s): FREET3 Anemia work up No results for input(s): VITAMINB12, FOLATE, FERRITIN, TIBC, IRON, RETICCTPCT in the last 72 hours. Urinalysis    Component Value Date/Time   COLORURINE AMBER (A) 02/15/2021 1258   APPEARANCEUR CLOUDY (A) 02/15/2021 1258   APPEARANCEUR Clear 09/28/2012 0134   LABSPEC 1.015 02/15/2021 1258   LABSPEC 1.018 09/28/2012 0134   PHURINE 7.0 02/15/2021 1258   GLUCOSEU NEGATIVE 02/15/2021 1258   GLUCOSEU Negative 09/28/2012 0134   GLUCOSEU NEGATIVE 06/03/2012 1559   HGBUR SMALL (A) 02/15/2021 1258   BILIRUBINUR NEGATIVE 02/15/2021 1258   BILIRUBINUR Negative 09/28/2012 0134   KETONESUR NEGATIVE 02/15/2021 1258   PROTEINUR 100 (A) 02/15/2021 1258   UROBILINOGEN 0.2 11/27/2014 0817   NITRITE NEGATIVE 02/15/2021 1258   LEUKOCYTESUR LARGE (A) 02/15/2021 1258   LEUKOCYTESUR Negative 09/28/2012 0134   Sepsis Labs Invalid input(s): PROCALCITONIN,  WBC,  LACTICIDVEN Microbiology Recent Results (from the past 240 hour(s))  Resp Panel by RT-PCR (Flu A&B, Covid) Nasopharyngeal Swab     Status: None   Collection Time: 02/15/21 10:15 AM   Specimen: Nasopharyngeal Swab; Nasopharyngeal(NP) swabs in vial transport medium  Result Value Ref Range Status   SARS Coronavirus 2 by RT PCR NEGATIVE NEGATIVE Final    Comment: (NOTE) SARS-CoV-2 target nucleic acids are NOT DETECTED.  The SARS-CoV-2 RNA is generally detectable in upper  respiratory specimens during the acute phase of infection. The lowest concentration of SARS-CoV-2 viral copies this assay can detect is 138 copies/mL. A negative result does not preclude SARS-Cov-2 infection and should not be used as the sole basis for treatment or other patient management decisions. A negative result may occur with  improper specimen collection/handling, submission of specimen other than nasopharyngeal swab, presence of viral mutation(s) within the areas targeted by this assay, and inadequate number of viral copies(<138 copies/mL). A negative result must be combined with clinical observations, patient history, and epidemiological information. The expected result is Negative.  Fact Sheet for Patients:  EntrepreneurPulse.com.au  Fact Sheet for Healthcare Providers:  IncredibleEmployment.be  This test is no t yet approved or cleared by the [place] FDA and  has been authorized for detection and/or diagnosis of SARS-CoV-2 by FDA under an Emergency Use Authorization (EUA). This EUA will remain  in effect (meaning this test can be used) for the duration of the COVID-19 declaration under Section 564(b)(1) of the Act, 21 U.S.C.section 360bbb-3(b)(1), unless the authorization is terminated  or revoked sooner.       Influenza A by PCR NEGATIVE NEGATIVE Final   Influenza  B by PCR NEGATIVE NEGATIVE Final    Comment: (NOTE) The Xpert Xpress SARS-CoV-2/FLU/RSV plus assay is intended as an aid in the diagnosis of influenza from Nasopharyngeal swab specimens and should not be used as a sole basis for treatment. Nasal washings and aspirates are unacceptable for Xpert Xpress SARS-CoV-2/FLU/RSV testing.  Fact Sheet for Patients: EntrepreneurPulse.com.au  Fact Sheet for Healthcare Providers: IncredibleEmployment.be  This test is not yet approved or cleared by the [place] FDA and has been  authorized for detection and/or diagnosis of SARS-CoV-2 by FDA under an Emergency Use Authorization (EUA). This EUA will remain in effect (meaning this test can be used) for the duration of the COVID-19 declaration under Section 564(b)(1) of the Act, 21 U.S.C. section 360bbb-3(b)(1), unless the authorization is terminated or revoked.  Performed at [facility], [place] [address], [place], [place] [address]    Time coordinating discharge: Over 30 minutes  SIGNED:  Reubin Milan, MD  [facility] 02/15/2021, 4:21 PM Pager   If 7PM-7AM, please contact night-coverage [email or web address] Password [email or web address]  This document was prepared using [employer] and may contain some unintended transcription errors.

## 2021-02-15 NOTE — ED Provider Notes (Signed)
[facility] DEPT Provider Note   CSN: [id number] Arrival date & time: 02/15/21  0913     History  Chief Complaint  Patient presents with   Possible Sepsis    Beth Gutierrez is a 88 y.o. female.  HPI Level 5 caveat due to mental status change and dementia.  Brought in from nursing home.  Reportedly DNR and has a MOST form.  By EMS patient reportedly had her wound care nurse stated that she was altered and sent her into the hospital.  Patient cannot really provide any history.  EMS states fever 102. MOST form states that patient is comfort care but also states would want antibiotics and IV fluids.  Discussed with patient's power of attorney, Nia Stipp. Was hypoxic for EMS.  Good blood pressure.    Home Medications Prior to Admission medications   Medication Sig Start Date End Date Taking? Authorizing Provider  acetaminophen (TYLENOL) 325 MG tablet Take 650 mg by mouth every 4 (four) hours as needed for mild pain or fever.     [provider]  AMBULATORY NON FORMULARY MEDICATION Water Bulk Liquid  120 cc three times daily    [provider]  AMBULATORY NON FORMULARY MEDICATION Medpass Liquid 120 ml twice a day between meals    [provider]  amLODipine (NORVASC) 5 MG tablet Take 5 mg by mouth daily. 10/28/20   [provider]  aspirin EC 81 MG tablet Take 81 mg by mouth daily. Swallow whole.    [provider]  b complex vitamins capsule Take 1 capsule by mouth daily.    [provider]  ergocalciferol (VITAMIN D2) 1.25 MG (50000 UT) capsule Take 50,000 Units by mouth every 28 (twenty-eight) days.    [provider]  famotidine (PEPCID) 20 MG tablet Take 20 mg by mouth every other day.    [provider]  ferrous sulfate 325 (65 FE) MG tablet Take 325 mg by mouth 3 (three) times daily with meals.    [provider]  melatonin 5 MG TABS Take 5 mg by mouth at  bedtime.    [provider]  metoprolol tartrate (LOPRESSOR) 25 MG tablet Take 12.5 mg by mouth daily.    [provider]  NUTRITIONAL SUPPLEMENT LIQD Take 120 mLs by mouth daily. [employer]    [provider]  OXYGEN Inhale 2 L into the lungs as needed.    [provider]  pantoprazole (PROTONIX) 40 MG tablet Take 1 tablet (40 mg total) by mouth 2 (two) times daily. 11/08/20   Irene Shipper, MD  polyethylene glycol [facility] / Floria Raveling) packet Take 17 g by mouth daily as needed.    [provider]  polyvinyl alcohol (LIQUIFILM TEARS) 1.4 % ophthalmic solution Place 1 drop into both eyes at bedtime.    [provider]      Allergies    Sulfonamide derivatives and Tramadol    Review of Systems   Review of Systems  Unable to perform ROS: Mental status change   Physical Exam Updated Vital Signs BP (!) 134/49    Pulse (!) 110    Temp 100 F (37.8 C) (Rectal)    Resp (!) 22    SpO2 100%  Physical Exam Vitals and nursing note reviewed.  Constitutional:      Comments: Patient currently nonverbal.  HENT:     Head: Atraumatic.     Mouth/Throat:     Mouth: Mucous membranes are dry.  Eyes:     General: No scleral icterus. Cardiovascular:     Rate and Rhythm: Tachycardia present.  Pulmonary:     Comments: Diffuse harsh breath sounds. Abdominal:     Tenderness: There is no abdominal tenderness.  Musculoskeletal:     Right lower leg: No edema.     Left lower leg: No edema.  Skin:    General: Skin is warm.  Neurological:     Comments: Nonverbal.  Breathing spontaneously.  Has gag reflex.  Sitting in bed with eyes closed.  Superficial pressure wounds and sacral area.  ED Results / Procedures / Treatments   Labs (all labs ordered are listed, but only abnormal results are displayed) Labs Reviewed  COMPREHENSIVE METABOLIC PANEL - Abnormal; Notable for the following components:      Result Value   Sodium 146 (*)    Potassium 5.9 (*)     Chloride 117 (*)    CO2 18 (*)    Glucose, Bld 116 (*)    BUN 83 (*)    Creatinine, Ser 3.29 (*)    Calcium 8.0 (*)    AST 45 (*)    Total Bilirubin 1.8 (*)    GFR, Estimated 12 (*)    All other components within normal limits  CBC WITH DIFFERENTIAL/PLATELET - Abnormal; Notable for the following components:   WBC 33.3 (*)    RBC 2.58 (*)    Hemoglobin 8.3 (*)    HCT 28.6 (*)    MCV 110.9 (*)    MCHC 29.0 (*)    RDW 19.1 (*)    Neutro Abs 28.2 (*)    Lymphs Abs 0.3 (*)    Monocytes Absolute 4.1 (*)    Abs Immature Granulocytes 0.65 (*)    All other components within normal limits  LACTIC ACID, PLASMA - Abnormal; Notable for the following components:   Lactic Acid, Venous 2.6 (*)    All other components within normal limits  RESP PANEL BY RT-PCR (FLU A&B, COVID) ARPGX2  CULTURE, BLOOD (ROUTINE X 2)  CULTURE, BLOOD (ROUTINE X 2)  URINALYSIS, ROUTINE W REFLEX MICROSCOPIC  LACTIC ACID, PLASMA  POC OCCULT BLOOD, ED    EKG EKG Interpretation  Date/Time:  Tuesday February 15 2021 09:40:05 EST Ventricular Rate:  119 PR Interval:  83 QRS Duration: 110 QT Interval:  326 QTC Calculation: 459 R Axis:   -10 Text Interpretation: Sinus tachycardia Ventricular premature complex Probable anterior infarct, age indeterminate Lateral leads are also involved Confirmed by Davonna Belling [phone]) on 02/15/2021 11:09:25 AM  Radiology DG Chest Portable 1 View  Result Date: 02/15/2021 CLINICAL DATA:  Shortness of breath, possible sepsis EXAM: PORTABLE CHEST 1 VIEW COMPARISON:  11/27/2014 FINDINGS: Mild cardiomegaly. Both lungs are clear. Severe bilateral glenohumeral arthrosis. IMPRESSION: Mild cardiomegaly without acute abnormality of the lungs in AP portable projection. No focal airspace opacity. Electronically Signed   By: Delanna Ahmadi M.D.   On: 02/15/2021 10:21    Procedures Procedures    Medications Ordered in ED Medications  cefTRIAXone (ROCEPHIN) 1 g in sodium chloride 0.9 % 100  mL IVPB (has no administration in time range)  azithromycin (ZITHROMAX) 500 mg in sodium chloride 0.9 % 250 mL IVPB (has no administration in time range)  sodium chloride 0.9 % bolus 500 mL (has no administration in time range)    ED Course/ Medical Decision Making/ A&P  Medical Decision Making  This patient presents to the ED for concern of sepsis., this involves an extensive number of treatment options, and is a complaint that carries with it a high risk of complications and morbidity.  The differential diagnosis includes sepsis, pneumonia, urinary tract infection, metabolic encephalopathy.  .   Co morbidities that complicate the patient evaluation  History of hypertension acute MI.  Also on hospice.   Additional history obtained:  Additional history obtained from patient's granddaughter, Mia.  Webb Silversmith from [place]. External records from outside source obtained and reviewed including MOST form, MAR.  Previous admissions.   Lab Tests:  I Ordered, and personally interpreted labs.  The pertinent results include: Leukocytosis, acute kidney injury.  Hyperkalemia.   Imaging Studies ordered:  I ordered imaging studies including chest x-ray I independently visualized and interpreted imaging which showed no acute pneumonia. I agree with the radiologist interpretation   Cardiac Monitoring:  The patient was maintained on a cardiac monitor.  I personally viewed and interpreted the cardiac monitored which showed an underlying rhythm of: Sinus rhythm   Medicines ordered and prescription drug management:  I ordered medication including Rocephin and azithromycin for pneumonia Reevaluation of the patient after these medicines showed that the patient stayed the same I have reviewed the patients home medicines and have made adjustments as needed   Test Considered:  CT abdomen pelvis.  Not done due to lack of abdominal  tenderness.   Critical Interventions:  Patient given fluid bolus started on oxygen and given IV antibiotics.   Consultations Obtained:  I requested consultation with the hospital liaison for hospice of the [place],  and discussed lab and imaging findings as well as pertinent plan - they recommend: Trial of antibiotics.  Fluids.  Admission to hospital.   Problem List / ED Course:   Patient brought in for fever mental status change.  Fever 102 for EMS.  Had cough.  Reported on chronic oxygen.  Decreased mental status.  Does have baseline dementia.  Once patient's granddaughter was available found that patient's is recently been on hospice.  Would not want intubation.  Would not want overly aggressive measures.  Does want antibiotics and fluids.  Discussed with hospital liaison.  White count elevated at 33.  Also has likely acute kidney injury with mild hyperkalemia at 5.9.  EKG did not show peaked T waves.  Discussed with Webb Silversmith from hospice of the [place].  Patient would want trial of antibiotics. Patient is also on scheduled morphine at the nursing home.  5 mg of p.o. liquid every 6 hours. Patient is rather ill.  Likely would have been intubated upon arrival due to decreased mental status.  However this is not within her wishes.  Full sepsis protocol not done due to patient's limited wishes.  Will discuss with palliative medicine if they need involvement and will admit to hospitalist   Reevaluation:  After the interventions noted above, I reevaluated the patient and found that they have :stayed the same   Social Determinants of Health:  Dementia   Dispostion:  After consideration of the diagnostic results and the patients response to treatment, I feel that the patent would benefit from admission to the hospital..  CRITICAL CARE Performed by: Davonna Belling Total critical care time: 30 minutes Critical care time was exclusive of separately billable procedures and  treating other patients. Critical care was necessary to treat or prevent imminent or life-threatening deterioration. Critical care was time spent personally by  me on the following activities: development of treatment plan with patient and/or surrogate as well as nursing, discussions with consultants, evaluation of patient's response to treatment, examination of patient, obtaining history from patient or surrogate, ordering and performing treatments and interventions, ordering and review of laboratory studies, ordering and review of radiographic studies, pulse oximetry and re-evaluation of patient's condition.          Final Clinical Impression(s) / ED Diagnoses Final diagnoses:  Sepsis with acute renal failure without septic shock, due to unspecified organism, unspecified acute renal failure type [facility])    Rx / DC Orders ED Discharge Orders     None         Davonna Belling, MD 02/15/21 1155

## 2021-02-15 NOTE — Progress Notes (Signed)
.  Transition of Care [facility]) - Emergency Department Mini Assessment   Patient Details  Name: Beth Gutierrez MRN: [id number] Date of Birth: [birth year]  [facility]) CM/SW Contact:    Illene Regulus, LCSW Phone Number: 02/15/2021, 6:35 PM   Clinical Narrative:  [facility] consult was order to coordinate Hospice services. Upon review of chart Hospice services are in place. TOC CSW Sign Off.  ED Mini Assessment:    Barriers to Discharge: No Barriers Identified     Means of departure: Ambulance       Patient Contact and Communications        ,                 Admission diagnosis:  Sepsis due to undetermined organism [facility]) [A41.9] Patient Active Problem List   Diagnosis Date Noted   Sepsis due to undetermined organism ([place]) 02/15/2021   Hypernatremia 02/15/2021   Hyperkalemia 02/15/2021   AKI (acute kidney injury) ([place]) 02/15/2021   Hyperbilirubinemia 02/15/2021   Acute upper respiratory infections of unspecified site 07/31/2013   Chronic diarrhea 04/29/2013   Erosive gastritis with hemorrhage 03/26/2013   Acute duodenal ulcer with hemorrhage 03/26/2013   Melena 03/25/2013   Loss of weight 03/25/2013   Primary localized osteoarthrosis, lower leg 02/25/2013   Osteoarthritis of left shoulder 01/21/2013   Left shoulder pain 12/17/2012   Adhesive capsulitis of left shoulder 12/17/2012   Generalized weakness 09/23/2012   Closed fracture of unspecified part of upper end of humerus 09/10/2012   NSTEMI (non-ST elevated myocardial infarction) ([place]) 09/10/2012   UTI (urinary tract infection) 06/12/2012   Abdominal pain, epigastric 06/03/2012   N&V (nausea and vomiting) 06/03/2012   Bilateral shoulder pain 10/13/2011   Gait disorder 10/13/2011   Anemia, iron deficiency 04/05/2011   Gout flare 02/06/2011   CKD (chronic kidney disease) stage 2, GFR 60-89 ml/min 01/29/2011   Pain in joint of right knee 01/06/2011   Preventative health care  01/04/2011   FOOT PAIN, LEFT 04/21/2010   GLAUCOMA 08/17/2008   Osteoarthritis 08/17/2008   DEPRESSION 03/55/9741   Chronic systolic heart failure ([place]) 07/03/2007   ALLERGIC RHINITIS 07/03/2007   GERD 07/03/2007   OSTEOPENIA 07/03/2007   FATIGUE 07/03/2007   Benign neoplasm of colon 06/15/2007   OBESITY, MORBID 06/15/2007   Old myocardial infarction 06/15/2007   DIVERTICULOSIS, COLON 06/15/2007   HYPERLIPIDEMIA 10/04/2006   GOUT 10/04/2006   Essential hypertension 10/04/2006   Coronary atherosclerosis 10/04/2006   PCP:  Pcp, No Pharmacy:  No Pharmacies Listed

## 2021-02-15 NOTE — ED Notes (Signed)
Family is wanting pt to go to [place], where they have an open bed. The contact information is Webb Silversmith [phone]. MD aware

## 2021-02-15 NOTE — Progress Notes (Signed)
° °  Pt daughter contacted and she reports that they have decided to focus on pure comfort and not proceed with antibiotics. She states that she feels this will prolong the inevitable. She is requesting that the pt be transferred to [facility] in [facility]. I have completed the referral process and the paperwork has been completed as well. I have spoken to Dr. Olevia Bowens and he has put in d/c order. I called [facility] at request of the RN Orvil Feil and asked for first available at 424pm.   Please reach out with any questions or concerns. Thank you for your assistance with this. Webb Silversmith RN [phone]

## 2021-02-15 NOTE — H&P (Addendum)
History and Physical    KESSLER KOPINSKI TGY:[id number] DOB: [birth year] DOA: 02/15/2021  PCP: Pcp, No   Patient coming from: [place] center.  I have personally briefly reviewed patient's old medical records in [place]  Chief Complaint: AMS/possible sepsis.  HPI: Beth Gutierrez is a 88 y.o. female with below past medical including a history of dementia who was sent from her facility to the emergency department for evaluation of altered mental status and possible sepsis.  Work-up showed concern for pneumonia and UTI.  After discussion with her granddaughter Beth Gutierrez Microbiologist (POA) and [employer], they decided that he was in the patient's best interest to provide comfort care only given her advanced age, medical conditions and personal wishes in the past.  ED Course: Initial vital signs were temperature 100 F, pulse 110, respirations 25, BP 122/48 mmHg O2 sat 97% on 10 LPM via simple mask.  The patient received 500 mL of NS bolus, ceftriaxone and azithromycin in the emergency department.  Lab work: CBC showed a white count 33.3, hemoglobin 8.3 g/dL and platelets 338.  Sodium 146, potassium 5.9, chloride 117 and CO2 18 mmol/L.  She has a normal anion gap.  Bilirubin 1.8, glucose 116, BUN 83, creatinine 3.29 and calcium 8.0 mg/dL.  AST was mildly elevated at 45 units/L, but the rest of the hepatic functions were normal.  However, the patient is hemoconcentrated and he is likely hypoalbuminemic.  Imaging: A portable 1 view chest radiograph show mild cardiomegaly without acute abnormality in the lungs.  Review of Systems: As per HPI otherwise all other systems reviewed and are negative.  Past Medical History:  Diagnosis Date   Acute duodenal ulcer with hemorrhage 03/26/2013   Acute myocardial infarction, unspecified site, episode of care unspecified    Allergic rhinitis, cause unspecified    Anemia, unspecified 04/05/2011   Benign neoplasm of colon    Congestive heart  failure, unspecified    Coronary atherosclerosis of unspecified type of vessel, native or graft    Depressive disorder, not elsewhere classified    Disorder of bone and cartilage, unspecified    Diverticulosis of colon (without mention of hemorrhage)    Esophageal reflux    Gout flare 02/06/2011   Gout, unspecified    Headache(784.0)    Morbid obesity ([place])    Need for prophylactic vaccination and inoculation against influenza    Osteoarthrosis, unspecified whether generalized or localized, unspecified site    Other and unspecified hyperlipidemia    Other malaise and fatigue    Pneumonia    Unspecified essential hypertension    Unspecified glaucoma(365.9)    Past Surgical History:  Procedure Laterality Date   CORONARY ANGIOPLASTY WITH STENT PLACEMENT     RCA stent   ECTOPIC PREGNANCY SURGERY     ESOPHAGOGASTRODUODENOSCOPY N/A 03/26/2013   Procedure: ESOPHAGOGASTRODUODENOSCOPY (EGD);  Surgeon: Gatha Mayer, MD;  Location[facility] ENDOSCOPY;  Service: Endoscopy;  Laterality: N/A;   LEFT HEART CATHETERIZATION WITH CORONARY ANGIOGRAM N/A 09/11/2012   Procedure: LEFT HEART CATHETERIZATION WITH CORONARY ANGIOGRAM;  Surgeon: Larey Dresser, MD;  Location: [facility] CATH LAB;  Service: Cardiovascular;  Laterality: N/A;   Left hip replacement     s/p 2002. Secondary to DJD   VESICOVAGINAL FISTULA CLOSURE W/ TAH     Social History  reports that she quit smoking about 30 years ago. Her smoking use included cigarettes. She has never used smokeless tobacco. She reports that she does not drink alcohol and does not use drugs.  Allergies  Allergen Reactions   Sulfonamide Derivatives Other (See Comments)    unknown   Tramadol     Family History  Problem Relation Age of Onset   Breast cancer Sister    Hypertension Father    Stroke Father    Colon cancer Neg Hx    Prior to Admission medications   Medication Sig Start Date End Date Taking? Authorizing Provider  AMBULATORY NON FORMULARY MEDICATION  Water Bulk Liquid  120 cc three times daily    [provider]  AMBULATORY NON FORMULARY MEDICATION Medpass Liquid 120 ml twice a day between meals    [provider]  amLODipine (NORVASC) 5 MG tablet Take 5 mg by mouth daily. 10/28/20   [provider]  aspirin EC 81 MG tablet Take 81 mg by mouth daily. Swallow whole.    [provider]  b complex vitamins capsule Take 1 capsule by mouth daily.    [provider]  ergocalciferol (VITAMIN D2) 1.25 MG (50000 UT) capsule Take 50,000 Units by mouth every 28 (twenty-eight) days.    [provider]  famotidine (PEPCID) 20 MG tablet Take 20 mg by mouth every other day.    [provider]  ferrous sulfate 325 (65 FE) MG tablet Take 325 mg by mouth 3 (three) times daily with meals.    [provider]  melatonin 5 MG TABS Take 5 mg by mouth at bedtime.    [provider]  metoprolol tartrate (LOPRESSOR) 25 MG tablet Take 12.5 mg by mouth daily.    [provider]  NUTRITIONAL SUPPLEMENT LIQD Take 120 mLs by mouth daily. MedPass    [provider]  OXYGEN Inhale 2 L into the lungs as needed.    [provider]  pantoprazole (PROTONIX) 40 MG tablet Take 1 tablet (40 mg total) by mouth 2 (two) times daily. 11/08/20   Irene Shipper, MD  polyethylene glycol [facility] / Floria Raveling) packet Take 17 g by mouth daily as needed.    [provider]  polyvinyl alcohol (LIQUIFILM TEARS) 1.4 % ophthalmic solution Place 1 drop into both eyes at bedtime.    [provider]    Physical Exam: Vitals:   02/15/21 0942 02/15/21 1000 02/15/21 1101 02/15/21 1130  BP: (!) 122/48 (!) 117/54 (!) 134/47 (!) 134/49  Pulse: (!) 110 100 (!) 111 (!) 110  Resp: (!) 25 (!) 21 (!) 21 (!) 22  Temp: 100 F (37.8 C)     TempSrc: Rectal     SpO2: 97% 100% 99% 100%    Constitutional: Looks acutely ill.  Obtunded. Eyes: PERRL, lids and conjunctivae normal ENMT:  Mucous membranes and lips are dry.  Posterior pharynx clear of any exudate or lesions. Neck: normal, supple, no masses, no thyromegaly Respiratory: Tachypneic, diminished at the bases, no wheezing, no crackles. No accessory muscle use.  Cardiovascular: Tachycardic., no murmurs / rubs / gallops. No extremity edema. 2+ pedal pulses. No carotid bruits.  Abdomen: No distention.  Soft, no tenderness, no masses palpated. No hepatosplenomegaly. Bowel sounds positive.  Musculoskeletal: no clubbing / cyanosis. Good ROM, no contractures.  Relaxed muscle tone.  Skin: Stage II pressure ulcer in her sacral area and right foot POA. Neurologic: Obtunded.  Correctly nonfocal. Psychiatric: Obtunded.  Labs on Admission: I have personally reviewed following labs and imaging studies  CBC: Recent Labs  Lab 02/15/21 1000  WBC 33.3*  NEUTROABS 28.2*  HGB 8.3*  HCT 28.6*  MCV 110.9*  PLT  269    Basic Metabolic Panel: Recent Labs  Lab 02/15/21 1000  NA 146*  K 5.9*  CL 117*  CO2 18*  GLUCOSE 116*  BUN 83*  CREATININE 3.29*  CALCIUM 8.0*    GFR: CrCl cannot be calculated (Unknown ideal weight.).  Liver Function Tests: Recent Labs  Lab 02/15/21 1000  AST 45*  ALT 17  ALKPHOS 71  BILITOT 1.8*  PROT 7.7  ALBUMIN 3.5   Urine analysis:    Component Value Date/Time   COLORURINE AMBER (A) 02/15/2021 1258   APPEARANCEUR CLOUDY (A) 02/15/2021 1258        LABSPEC 1.015 02/15/2021 1258        PHURINE 7.0 02/15/2021 1258   GLUCOSEU NEGATIVE 02/15/2021 1258             HGBUR SMALL (A) 02/15/2021 1258   BILIRUBINUR NEGATIVE 02/15/2021 1258        KETONESUR NEGATIVE 02/15/2021 1258   PROTEINUR 100 (A) 02/15/2021 1258        NITRITE NEGATIVE 02/15/2021 1258   LEUKOCYTESUR LARGE (A) 02/15/2021 1258        Radiological Exams on Admission: DG Chest Portable 1 View  Result Date: 02/15/2021 CLINICAL DATA:  Shortness of breath, possible sepsis EXAM: PORTABLE CHEST 1 VIEW COMPARISON:   11/27/2014 FINDINGS: Mild cardiomegaly. Both lungs are clear. Severe bilateral glenohumeral arthrosis. IMPRESSION: Mild cardiomegaly without acute abnormality of the lungs in AP portable projection. No focal airspace opacity. Electronically Signed   By: Delanna Ahmadi M.D.   On: 02/15/2021 10:21    EKG: Independently reviewed.  Vent. rate 119 BPM PR interval 83 ms QRS duration 110 ms QT/QTcB 326/459 ms P-R-T axes 89 -10 199 Sinus tachycardia Ventricular premature complex Probable anterior infarct, age indeterminate Lateral leads are also involved  Assessment/Plan Principal Problem:   Sepsis due to undetermined organism POA  Observation/palliative care. Discontinue IV antibiotics. Agreeable to IV fluids. Morphine as needed. Lorazepam as needed. Glycopyrrolate as needed.  Active Problems:   AKI (acute kidney injury) ([place]) Superimposed on   CKD (chronic kidney disease) stage 2, GFR 60-89 ml/min Family agreeable to IV hydration    Hypernatremia   Hyperkalemia   Hyperbilirubinemia In the setting of prerenal osteopenia/dehydration. Continue dextrose 5% at 150 mL/h. No laboratory follow-up needed.    Chronic systolic heart failure (HCC)  No signs of decompensation.     Stage II pressure ulcer of sacral area and right foot POA Continue local care as needed.    DVT prophylaxis: No DVT prophylaxis.  Comfort care only. Code Status:   DNR. Family Communication:  Spoke to her granddaughter Beth Gutierrez Comptroller) and daughter Donnald Garre. Disposition Plan:   Patient is from:  SNF.  Anticipated DC to:  TBD.  Anticipated DC date:  02/16/2021.  Anticipated DC barriers:   Consults called:  Transitional care team. Admission status:  Observation/palliative care.  Severity of Illness: High severity due to sepsis secondary to pneumonia and UTI.  Reubin Milan MD [facility]  How to contact the [facility] Attending or Consulting provider [place] or covering provider during after hours [place],  for this patient?   Check the care team in [facility] and look for a) attending/consulting [facility] provider listed and b) the [facility] team listed Log into [email or web address] and use [place]'s universal password to access. If you do not have the password, please contact the hospital operator. Locate the [facility] provider you are looking for under [facility] and page to a number  that you can be directly reached. If you still have difficulty reaching the provider, please page the [facility] (Director on Call) for the Hospitalists listed on amion for assistance.  02/15/2021, 2:50 PM   This document was prepared using Dragon voice recognition software and may contain some unintended transcription errors.

## 2021-02-15 NOTE — ED Triage Notes (Signed)
Pt is arriving from [place] center with c/o possible sepsis. Has DNR and MOST form with her. Staff noticed that she was altered this morning. Pt is bed-ridden, has multiple skin wounds, and currently has audible crackles while breathing.

## 2021-02-15 NOTE — ED Notes (Signed)
Introduced myself to patient and family member, Daughter-in-law, at bedside. Patient is currently resting in bed asleep. Paperwork is printed and at bedside. Confirmed DNR status with family member at bedside.

## 2021-02-16 NOTE — ED Notes (Signed)
[facility] is here for transport.

## 2021-02-16 NOTE — ED Notes (Signed)
Patient transferred to [place]

## 2021-02-16 NOTE — ED Notes (Signed)
[place] to advise patient was on the way

## 2021-02-20 LAB — CULTURE, BLOOD (ROUTINE X 2)
Culture: NO GROWTH
Culture: NO GROWTH
Special Requests: ADEQUATE

## 2021-03-16 DEATH — deceased

## 2022-08-28 IMAGING — DX DG CHEST 1V PORT
1 series · 1 of 1 positions shown · non-contrast
Comparison: 11/27/2014

CLINICAL DATA: Shortness of breath, possible sepsis

EXAM:
PORTABLE CHEST 1 VIEW

[chest ap]
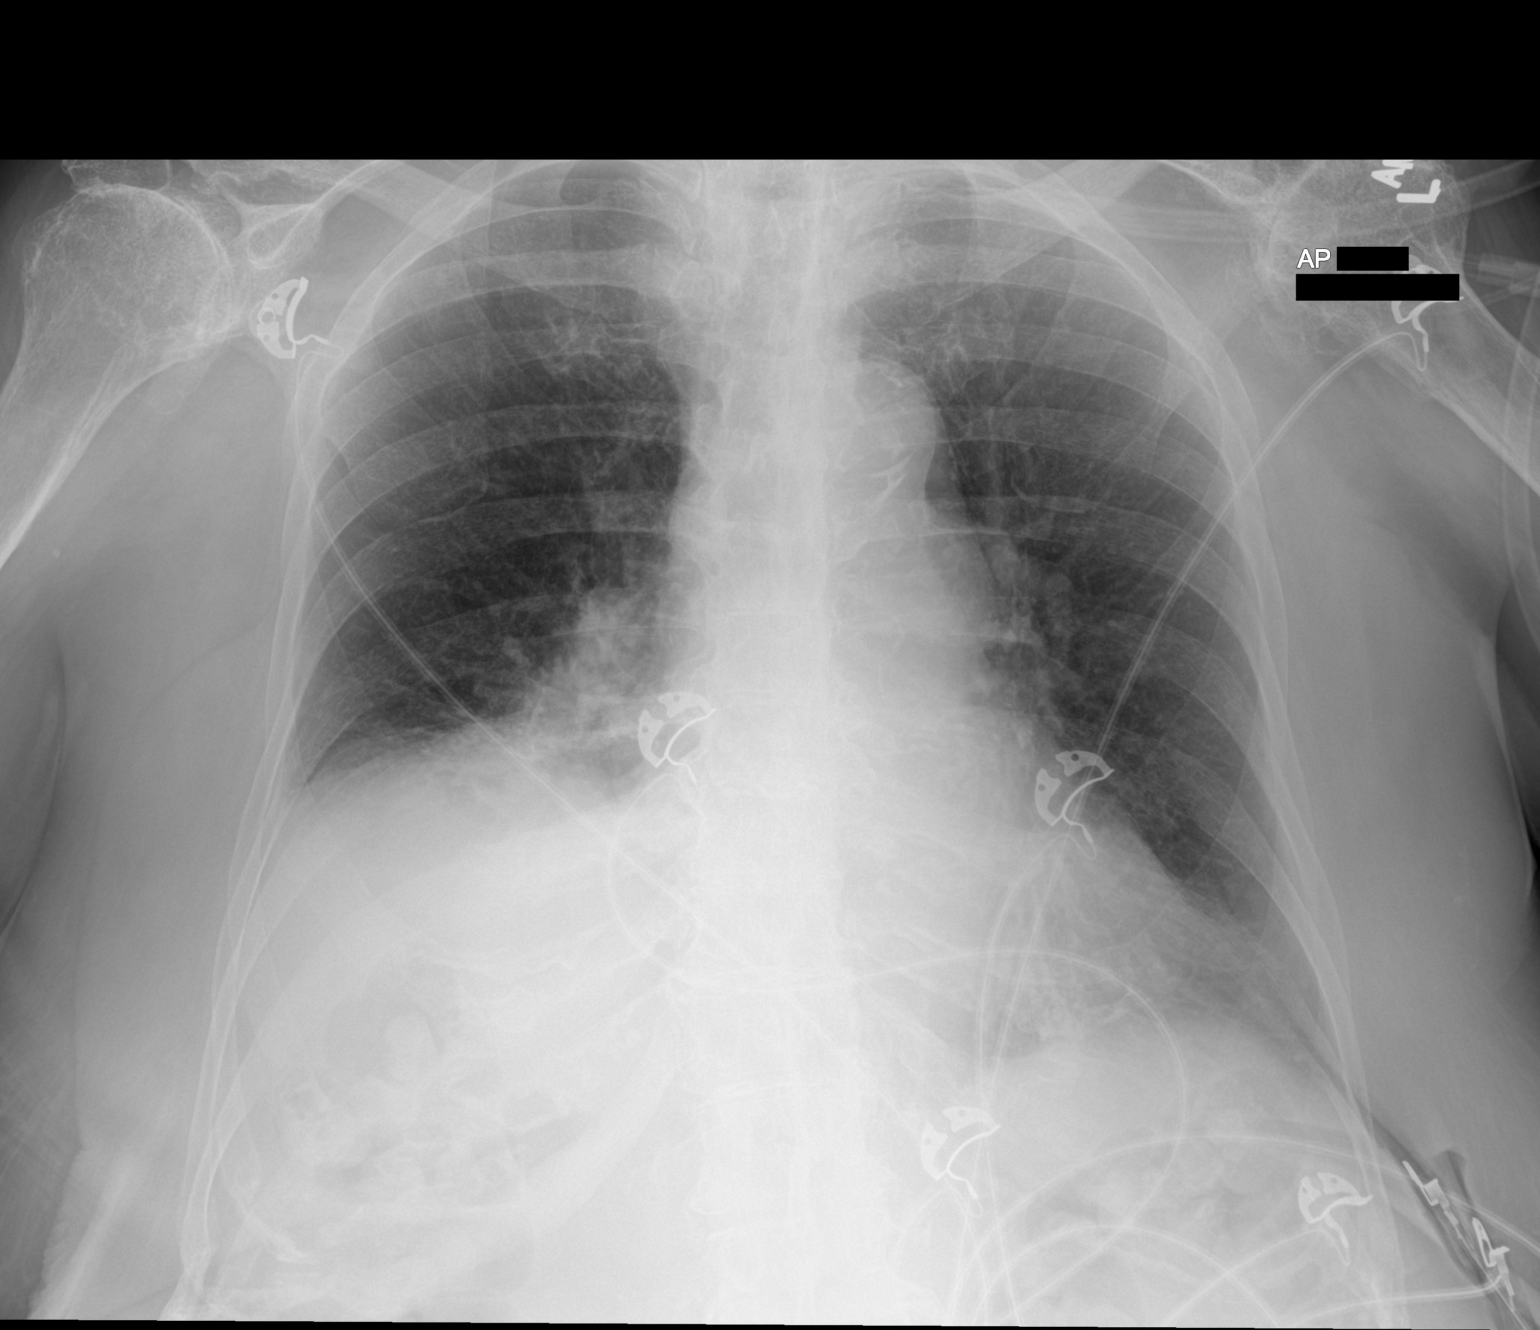

[1 of 1 positions shown; findings below may reference images not displayed]

FINDINGS: Mild cardiomegaly. Both lungs are clear. Severe bilateral
glenohumeral arthrosis.
IMPRESSION: Mild cardiomegaly without acute abnormality of the lungs in AP
portable projection. No focal airspace opacity.
